# Patient Record
Sex: Male | Born: 1945 | Race: Black or African American | Hispanic: No | Smoking: Never smoker
Health system: Southern US, Community
[De-identification: ages and names within clinical notes are randomized; demographics above are authoritative.]

## PROBLEM LIST (undated history)

## (undated) DIAGNOSIS — G40909 Epilepsy, unspecified, not intractable, without status epilepticus: Secondary | ICD-10-CM

## (undated) DIAGNOSIS — I639 Cerebral infarction, unspecified: Secondary | ICD-10-CM

## (undated) DIAGNOSIS — R351 Nocturia: Secondary | ICD-10-CM

## (undated) DIAGNOSIS — Z87438 Personal history of other diseases of male genital organs: Secondary | ICD-10-CM

## (undated) DIAGNOSIS — F1111 Opioid abuse, in remission: Secondary | ICD-10-CM

## (undated) DIAGNOSIS — E119 Type 2 diabetes mellitus without complications: Secondary | ICD-10-CM

## (undated) DIAGNOSIS — R32 Unspecified urinary incontinence: Secondary | ICD-10-CM

## (undated) DIAGNOSIS — M1990 Unspecified osteoarthritis, unspecified site: Secondary | ICD-10-CM

## (undated) DIAGNOSIS — M545 Low back pain, unspecified: Secondary | ICD-10-CM

## (undated) DIAGNOSIS — N471 Phimosis: Secondary | ICD-10-CM

## (undated) DIAGNOSIS — N182 Chronic kidney disease, stage 2 (mild): Secondary | ICD-10-CM

## (undated) DIAGNOSIS — I10 Essential (primary) hypertension: Secondary | ICD-10-CM

## (undated) DIAGNOSIS — E785 Hyperlipidemia, unspecified: Secondary | ICD-10-CM

## (undated) DIAGNOSIS — F319 Bipolar disorder, unspecified: Secondary | ICD-10-CM

## (undated) DIAGNOSIS — Z972 Presence of dental prosthetic device (complete) (partial): Secondary | ICD-10-CM

## (undated) DIAGNOSIS — G8929 Other chronic pain: Secondary | ICD-10-CM

## (undated) DIAGNOSIS — K759 Inflammatory liver disease, unspecified: Secondary | ICD-10-CM

## (undated) DIAGNOSIS — Z973 Presence of spectacles and contact lenses: Secondary | ICD-10-CM

## (undated) DIAGNOSIS — R569 Unspecified convulsions: Secondary | ICD-10-CM

## (undated) DIAGNOSIS — Z8619 Personal history of other infectious and parasitic diseases: Secondary | ICD-10-CM

## (undated) DIAGNOSIS — Z8673 Personal history of transient ischemic attack (TIA), and cerebral infarction without residual deficits: Secondary | ICD-10-CM

## (undated) DIAGNOSIS — F419 Anxiety disorder, unspecified: Secondary | ICD-10-CM

## (undated) HISTORY — PX: BACK SURGERY: SHX140

## (undated) HISTORY — DX: Hyperlipidemia, unspecified: E78.5

## (undated) HISTORY — PX: OTHER SURGICAL HISTORY: SHX169

---

## 1988-07-28 HISTORY — PX: OTHER SURGICAL HISTORY: SHX169

## 2010-02-13 ENCOUNTER — Observation Stay (HOSPITAL_COMMUNITY)
Admission: EM | Admit: 2010-02-13 | Discharge: 2010-02-14 | Payer: Self-pay | Source: Home / Self Care | Admitting: Emergency Medicine

## 2010-02-17 ENCOUNTER — Emergency Department (HOSPITAL_COMMUNITY): Admission: EM | Admit: 2010-02-17 | Discharge: 2010-02-18 | Payer: Self-pay | Admitting: Emergency Medicine

## 2010-06-03 ENCOUNTER — Emergency Department (HOSPITAL_COMMUNITY): Admission: EM | Admit: 2010-06-03 | Discharge: 2010-06-03 | Payer: Self-pay | Admitting: Emergency Medicine

## 2010-08-01 ENCOUNTER — Emergency Department (HOSPITAL_COMMUNITY)
Admission: EM | Admit: 2010-08-01 | Discharge: 2010-08-01 | Payer: Self-pay | Source: Home / Self Care | Admitting: Emergency Medicine

## 2010-10-08 LAB — CBC
Hemoglobin: 11.7 g/dL — ABNORMAL LOW (ref 13.0–17.0)
MCH: 30.8 pg (ref 26.0–34.0)
RBC: 3.8 MIL/uL — ABNORMAL LOW (ref 4.22–5.81)

## 2010-10-08 LAB — COMPREHENSIVE METABOLIC PANEL
ALT: 19 U/L (ref 0–53)
AST: 31 U/L (ref 0–37)
Alkaline Phosphatase: 78 U/L (ref 39–117)
CO2: 29 mEq/L (ref 19–32)
Chloride: 106 mEq/L (ref 96–112)
Creatinine, Ser: 1.03 mg/dL (ref 0.4–1.5)
GFR calc Af Amer: >60 mL/min (ref >60)
GFR calc non Af Amer: >60 mL/min (ref >60)
Potassium: 4.3 mEq/L (ref 3.5–5.1)
Total Bilirubin: 0.6 mg/dL (ref 0.3–1.2)

## 2010-10-08 LAB — DIFFERENTIAL
Basophils Absolute: 0 10*3/uL (ref 0.0–0.1)
Basophils Relative: 0 % (ref 0–1)
Eosinophils Absolute: 0.1 10*3/uL (ref 0.0–0.7)
Eosinophils Relative: 1 % (ref 0–5)
Lymphocytes Relative: 27 % (ref 12–46)

## 2010-10-08 LAB — RAPID URINE DRUG SCREEN, HOSP PERFORMED: Barbiturates: NOT DETECTED

## 2010-10-12 LAB — COMPREHENSIVE METABOLIC PANEL
ALT: 10 U/L (ref 0–53)
AST: 22 U/L (ref 0–37)
Albumin: 3.5 g/dL (ref 3.5–5.2)
Alkaline Phosphatase: 86 U/L (ref 39–117)
CO2: 22 mEq/L (ref 19–32)
Chloride: 107 mEq/L (ref 96–112)
Creatinine, Ser: 0.96 mg/dL (ref 0.4–1.5)
GFR calc Af Amer: >60 mL/min (ref >60)
GFR calc non Af Amer: >60 mL/min (ref >60)
Potassium: 3.6 mEq/L (ref 3.5–5.1)
Total Bilirubin: 0.9 mg/dL (ref 0.3–1.2)

## 2010-10-12 LAB — CBC
HCT: 41 % (ref 39.0–52.0)
Hemoglobin: 13.4 g/dL (ref 13.0–17.0)
Hemoglobin: 13.4 g/dL (ref 13.0–17.0)
MCH: 31.5 pg (ref 26.0–34.0)
MCH: 33.3 pg (ref 26.0–34.0)
MCHC: 32.8 g/dL (ref 30.0–36.0)
Platelets: 132 10*3/uL — ABNORMAL LOW (ref 150–400)
RBC: 4.04 MIL/uL — ABNORMAL LOW (ref 4.22–5.81)
RBC: 4.26 MIL/uL (ref 4.22–5.81)
WBC: 7.5 10*3/uL (ref 4.0–10.5)

## 2010-10-12 LAB — DIFFERENTIAL
Basophils Absolute: 0 10*3/uL (ref 0.0–0.1)
Basophils Relative: 0 % (ref 0–1)
Basophils Relative: 1 % (ref 0–1)
Eosinophils Absolute: 0.1 10*3/uL (ref 0.0–0.7)
Eosinophils Absolute: 0.1 10*3/uL (ref 0.0–0.7)
Eosinophils Relative: 1 % (ref 0–5)
Lymphocytes Relative: 21 % (ref 12–46)
Lymphs Abs: 1 10*3/uL (ref 0.7–4.0)
Monocytes Absolute: 0.6 10*3/uL (ref 0.1–1.0)
Monocytes Absolute: 0.7 10*3/uL (ref 0.1–1.0)
Monocytes Relative: 8 % (ref 3–12)
Neutro Abs: 5.3 10*3/uL (ref 1.7–7.7)

## 2010-10-12 LAB — GLUCOSE, CAPILLARY: Glucose-Capillary: 186 mg/dL — ABNORMAL HIGH (ref 70–99)

## 2010-10-12 LAB — BASIC METABOLIC PANEL
CO2: 26 mEq/L (ref 19–32)
Chloride: 107 mEq/L (ref 96–112)
Glucose, Bld: 113 mg/dL — ABNORMAL HIGH (ref 70–99)
Potassium: 4.2 mEq/L (ref 3.5–5.1)
Sodium: 139 mEq/L (ref 135–145)

## 2010-10-12 LAB — POCT CARDIAC MARKERS: CKMB, poc: <1 ng/mL — ABNORMAL LOW (ref 1.0–8.0)

## 2011-07-29 HISTORY — PX: COLONOSCOPY: SHX174

## 2011-11-24 ENCOUNTER — Emergency Department (HOSPITAL_COMMUNITY): Payer: Medicare Other

## 2011-11-24 ENCOUNTER — Encounter (HOSPITAL_COMMUNITY): Payer: Self-pay | Admitting: Emergency Medicine

## 2011-11-24 ENCOUNTER — Emergency Department (HOSPITAL_COMMUNITY)
Admission: EM | Admit: 2011-11-24 | Discharge: 2011-11-24 | Disposition: A | Discharge status: Home / Self Care | Payer: Medicare Other | Attending: Emergency Medicine | Admitting: Emergency Medicine

## 2011-11-24 DIAGNOSIS — R0789 Other chest pain: Secondary | ICD-10-CM | POA: Insufficient documentation

## 2011-11-24 DIAGNOSIS — E119 Type 2 diabetes mellitus without complications: Secondary | ICD-10-CM | POA: Insufficient documentation

## 2011-11-24 DIAGNOSIS — K529 Noninfective gastroenteritis and colitis, unspecified: Secondary | ICD-10-CM

## 2011-11-24 DIAGNOSIS — R197 Diarrhea, unspecified: Secondary | ICD-10-CM | POA: Insufficient documentation

## 2011-11-24 DIAGNOSIS — I10 Essential (primary) hypertension: Secondary | ICD-10-CM | POA: Insufficient documentation

## 2011-11-24 DIAGNOSIS — R1013 Epigastric pain: Secondary | ICD-10-CM | POA: Insufficient documentation

## 2011-11-24 DIAGNOSIS — Z79899 Other long term (current) drug therapy: Secondary | ICD-10-CM | POA: Insufficient documentation

## 2011-11-24 DIAGNOSIS — Z8619 Personal history of other infectious and parasitic diseases: Secondary | ICD-10-CM | POA: Insufficient documentation

## 2011-11-24 DIAGNOSIS — R112 Nausea with vomiting, unspecified: Secondary | ICD-10-CM | POA: Insufficient documentation

## 2011-11-24 DIAGNOSIS — R0602 Shortness of breath: Secondary | ICD-10-CM | POA: Insufficient documentation

## 2011-11-24 DIAGNOSIS — B1920 Unspecified viral hepatitis C without hepatic coma: Secondary | ICD-10-CM | POA: Insufficient documentation

## 2011-11-24 DIAGNOSIS — K5289 Other specified noninfective gastroenteritis and colitis: Secondary | ICD-10-CM | POA: Insufficient documentation

## 2011-11-24 HISTORY — DX: Essential (primary) hypertension: I10

## 2011-11-24 LAB — URINE MICROSCOPIC-ADD ON

## 2011-11-24 LAB — CBC
Hemoglobin: 13.5 g/dL (ref 13.0–17.0)
MCH: 31.6 pg (ref 26.0–34.0)
MCHC: 34 g/dL (ref 30.0–36.0)
Platelets: 118 10*3/uL — ABNORMAL LOW (ref 150–400)
RDW: 14.4 % (ref 11.5–15.5)

## 2011-11-24 LAB — LIPASE, BLOOD: Lipase: 23 U/L (ref 11–59)

## 2011-11-24 LAB — URINALYSIS, ROUTINE W REFLEX MICROSCOPIC
Glucose, UA: NEGATIVE mg/dL
Ketones, ur: 15 mg/dL — AB
Nitrite: NEGATIVE
Specific Gravity, Urine: 1.037 — ABNORMAL HIGH (ref 1.005–1.030)
pH: 5.5 (ref 5.0–8.0)

## 2011-11-24 LAB — DIFFERENTIAL
Basophils Absolute: 0 10*3/uL (ref 0.0–0.1)
Basophils Relative: 0 % (ref 0–1)
Eosinophils Absolute: 0 10*3/uL (ref 0.0–0.7)
Monocytes Relative: 7 % (ref 3–12)
Neutro Abs: 10 10*3/uL — ABNORMAL HIGH (ref 1.7–7.7)
Neutrophils Relative %: 82 % — ABNORMAL HIGH (ref 43–77)

## 2011-11-24 LAB — COMPREHENSIVE METABOLIC PANEL
AST: 19 U/L (ref 0–37)
Albumin: 3.8 g/dL (ref 3.5–5.2)
Alkaline Phosphatase: 113 U/L (ref 39–117)
BUN: 24 mg/dL — ABNORMAL HIGH (ref 6–23)
Chloride: 104 mEq/L (ref 96–112)
Potassium: 3.5 mEq/L (ref 3.5–5.1)
Total Protein: 7.7 g/dL (ref 6.0–8.3)

## 2011-11-24 LAB — POCT I-STAT TROPONIN I

## 2011-11-24 MED ORDER — HYDROMORPHONE HCL PF 2 MG/ML IJ SOLN
1.0000 mg | Freq: Once | INTRAMUSCULAR | Status: AC
  Administered 2011-11-24: 1 mg via INTRAVENOUS
  Filled 2011-11-24: qty 1

## 2011-11-24 MED ORDER — MORPHINE SULFATE 4 MG/ML IJ SOLN
4.0000 mg | Freq: Once | INTRAMUSCULAR | Status: AC
  Administered 2011-11-24: 4 mg via INTRAVENOUS
  Filled 2011-11-24: qty 1

## 2011-11-24 MED ORDER — ONDANSETRON HCL 4 MG/2ML IJ SOLN
INTRAMUSCULAR | Status: AC
  Filled 2011-11-24: qty 2

## 2011-11-24 MED ORDER — METOCLOPRAMIDE HCL 10 MG PO TABS: 10.0000 mg | ORAL_TABLET | Freq: Four times a day (QID) | ORAL | Status: DC

## 2011-11-24 MED ORDER — DICYCLOMINE HCL 20 MG PO TABS: 20.0000 mg | ORAL_TABLET | Freq: Two times a day (BID) | ORAL | Status: DC

## 2011-11-24 MED ORDER — METOCLOPRAMIDE HCL 5 MG/ML IJ SOLN
10.0000 mg | Freq: Once | INTRAMUSCULAR | Status: AC
  Administered 2011-11-24: 10 mg via INTRAVENOUS
  Filled 2011-11-24: qty 2

## 2011-11-24 NOTE — ED Provider Notes (Signed)
History     CSN: [id number]  Arrival date & time 11/24/11  1813   First MD Initiated Contact with Patient 11/24/11 1852      6:56 PM HPI Patient reports suddenly developing diarrhea. Reports symptoms were associated with nausea. Data since he finished his bowel movements he had epigastric pain that radiated to his left chest. Patient is complaining of significant amount nausea. Denies been sick contacts or food contacts. Denies history of MI. Does have a history of diabetes and hypertension. Patient is a 66 y.o. male presenting with chest pain. The history is provided by the patient.  Chest Pain The chest pain began 3 - 5 hours ago. Chest pain occurs constantly. The chest pain is worsening. The severity of the pain is moderate. The quality of the pain is described as tightness. The pain radiates to the epigastrium. Primary symptoms include shortness of breath, abdominal pain, nausea and vomiting. Pertinent negatives for primary symptoms include no fever, no fatigue and no dizziness.  Pertinent negatives for associated symptoms include no claudication, no diaphoresis, no near-syncope, no numbness, no orthopnea and no weakness. He tried aspirin and nitroglycerin for the symptoms. Risk factors include male gender.  His past medical history is significant for diabetes and hypertension.  Pertinent negatives for past medical history include no cancer, no CHF, no DVT, no hyperlipidemia, no MI, no PE and no TIA.  Pertinent negatives for family medical history include: no early MI in family.     Past Medical History  Diagnosis Date  . Hepatitis C   . Diabetes mellitus   . Hypertension     History reviewed. No pertinent past surgical history.  History reviewed. No pertinent family history.  History  Substance Use Topics  . Smoking status: Not on file  . Smokeless tobacco: Not on file  . Alcohol Use:       Review of Systems  Constitutional: Negative for fever, chills, diaphoresis and  fatigue.  HENT: Negative for sore throat.   Respiratory: Positive for shortness of breath.   Cardiovascular: Positive for chest pain. Negative for orthopnea, claudication and near-syncope.  Gastrointestinal: Positive for nausea, vomiting, abdominal pain and diarrhea. Negative for constipation, blood in stool and rectal pain.  Genitourinary: Negative for dysuria, hematuria, flank pain, discharge, penile pain and testicular pain.  Musculoskeletal: Negative for back pain.  Neurological: Negative for dizziness, weakness, numbness and headaches.  All other systems reviewed and are negative.    Allergies  Review of patient's allergies indicates no known allergies.  Home Medications   Current Outpatient Rx  Name Route Sig Dispense Refill  . METHADONE HCL 10 MG PO TABS Oral Take 10 mg by mouth at bedtime.    Marland Kitchen RISPERIDONE 0.5 MG PO TABS Oral Take 0.5 mg by mouth 2 (two) times daily.      BP 134/68  Pulse 62  Temp(Src) 98.8 F (37.1 C) (Oral)  Resp 10  SpO2 94%  Physical Exam  Constitutional: He is oriented to person, place, and time. He appears well-developed and well-nourished.  HENT:  Head: Normocephalic and atraumatic.  Eyes: Conjunctivae are normal. Pupils are equal, round, and reactive to light.  Neck: Normal range of motion. Neck supple.  Cardiovascular: Normal rate, regular rhythm and normal heart sounds.  Exam reveals no gallop and no friction rub.   No murmur heard. Pulmonary/Chest: Effort normal and breath sounds normal. He has no wheezes. He has no rales. He exhibits no tenderness.  Abdominal: Soft. Bowel sounds are normal. He  exhibits no distension and no mass. There is no tenderness. There is no rebound and no guarding.       Patient appears his about to vomit.  Neurological: He is alert and oriented to person, place, and time.  Skin: Skin is warm and dry. No rash noted. No erythema. No pallor.  Psychiatric: He has a normal mood and affect. His behavior is normal.     ED Course  Procedures   Date: 11/24/2011  Rate: 56  Rhythm: normal sinus rhythm  QRS Axis: normal  Intervals: normal  ST/T Wave abnormalities: normal  Conduction Disutrbances: none   Results for orders placed during the hospital encounter of 11/24/11  CBC      Component Value Range   WBC 12.2 (*) 4.0 - 10.5 (K/uL)   RBC 4.27  4.22 - 5.81 (MIL/uL)   Hemoglobin 13.5  13.0 - 17.0 (g/dL)   HCT 16.1  09.6 - 04.5 (%)   MCV 93.0  78.0 - 100.0 (fL)   MCH 31.6  26.0 - 34.0 (pg)   MCHC 34.0  30.0 - 36.0 (g/dL)   RDW 40.9  81.1 - 91.4 (%)   Platelets 118 (*) 150 - 400 (K/uL)  DIFFERENTIAL      Component Value Range   Neutrophils Relative 82 (*) 43 - 77 (%)   Neutro Abs 10.0 (*) 1.7 - 7.7 (K/uL)   Lymphocytes Relative 11 (*) 12 - 46 (%)   Lymphs Abs 1.3  0.7 - 4.0 (K/uL)   Monocytes Relative 7  3 - 12 (%)   Monocytes Absolute 0.8  0.1 - 1.0 (K/uL)   Eosinophils Relative 0  0 - 5 (%)   Eosinophils Absolute 0.0  0.0 - 0.7 (K/uL)   Basophils Relative 0  0 - 1 (%)   Basophils Absolute 0.0  0.0 - 0.1 (K/uL)  COMPREHENSIVE METABOLIC PANEL      Component Value Range   Sodium 139  135 - 145 (mEq/L)   Potassium 3.5  3.5 - 5.1 (mEq/L)   Chloride 104  96 - 112 (mEq/L)   CO2 25  19 - 32 (mEq/L)   Glucose, Bld 115 (*) 70 - 99 (mg/dL)   BUN 24 (*) 6 - 23 (mg/dL)   Creatinine, Ser 7.82  0.50 - 1.35 (mg/dL)   Calcium 9.2  8.4 - 95.6 (mg/dL)   Total Protein 7.7  6.0 - 8.3 (g/dL)   Albumin 3.8  3.5 - 5.2 (g/dL)   AST 19  0 - 37 (U/L)   ALT 8  0 - 53 (U/L)   Alkaline Phosphatase 113  39 - 117 (U/L)   Total Bilirubin 0.2 (*) 0.3 - 1.2 (mg/dL)   GFR calc non Af Amer 77 (*) >90 (mL/min)   GFR calc Af Amer 89 (*) >90 (mL/min)  LIPASE, BLOOD      Component Value Range   Lipase 23  11 - 59 (U/L)  URINALYSIS, ROUTINE W REFLEX MICROSCOPIC      Component Value Range   Color, Urine AMBER (*) YELLOW    APPearance CLOUDY (*) CLEAR    Specific Gravity, Urine 1.037 (*) 1.005 - 1.030    pH 5.5   5.0 - 8.0    Glucose, UA NEGATIVE  NEGATIVE (mg/dL)   Hgb urine dipstick SMALL (*) NEGATIVE    Bilirubin Urine SMALL (*) NEGATIVE    Ketones, ur 15 (*) NEGATIVE (mg/dL)   Protein, ur 30 (*) NEGATIVE (mg/dL)   Urobilinogen, UA 1.0  0.0 -  1.0 (mg/dL)   Nitrite NEGATIVE  NEGATIVE    Leukocytes, UA SMALL (*) NEGATIVE   POCT I-STAT TROPONIN I      Component Value Range   Troponin i, poc 0.00  0.00 - 0.08 (ng/mL)   Comment 3           URINE MICROSCOPIC-ADD ON      Component Value Range   Squamous Epithelial / LPF FEW (*) RARE    WBC, UA 11-20  <3 (WBC/hpf)   RBC / HPF 7-10  <3 (RBC/hpf)   Bacteria, UA RARE  RARE    Casts HYALINE CASTS (*) NEGATIVE    Urine-Other MUCOUS PRESENT     Dg Chest 2 View  11/24/2011  *RADIOLOGY REPORT*  Clinical Data: Chest pain, hypertension, diabetes  CHEST - 2 VIEW  Comparison: 02/13/2010  Findings: Minimal increase in size of cardiac silhouette and mediastinal contours, possibly accentuated due to slightly decreased lung volumes.  No focal airspace opacities.  No definite pleural effusion or pneumothorax.  Unchanged bones.  IMPRESSION: 1.  No acute cardiopulmonary disease. 2.  Apparent minimal increase in size of cardiac silhouette and mediastinal contours is possibly accentuated due to slightly decreased lung volumes. Further evaluation with cardiac echo may be performed as clinically indicated.  Original Report Authenticated By: Waynard Reeds, M.D.    MDM   10:36 PM Discussed labs and exam are normal. Will give meds for nausea and pain. Discussed this with patient. Pt voices understanding. Patient exam continues to be benign of pain         Thomasene Lot, PA-C 11/24/11 2238

## 2011-11-24 NOTE — Discharge Instructions (Signed)
Viral Gastroenteritis Viral gastroenteritis is also known as stomach flu. This condition affects the stomach and intestinal tract. It can cause sudden diarrhea and vomiting. The illness typically lasts 3 to 8 days. Most people develop an immune response that eventually gets rid of the virus. While this natural response develops, the virus can make you quite ill. CAUSES  Many different viruses can cause gastroenteritis, such as rotavirus or noroviruses. You can catch one of these viruses by consuming contaminated food or water. You may also catch a virus by sharing utensils or other personal items with an infected person or by touching a contaminated surface. SYMPTOMS  The most common symptoms are diarrhea and vomiting. These problems can cause a severe loss of body fluids (dehydration) and a body salt (electrolyte) imbalance. Other symptoms may include:  Fever.   Headache.   Fatigue.   Abdominal pain.  DIAGNOSIS  Your caregiver can usually diagnose viral gastroenteritis based on your symptoms and a physical exam. A stool sample may also be taken to test for the presence of viruses or other infections. TREATMENT  This illness typically goes away on its own. Treatments are aimed at rehydration. The most serious cases of viral gastroenteritis involve vomiting so severely that you are not able to keep fluids down. In these cases, fluids must be given through an intravenous line (IV). HOME CARE INSTRUCTIONS   Drink enough fluids to keep your urine clear or pale yellow. Drink small amounts of fluids frequently and increase the amounts as tolerated.   Ask your caregiver for specific rehydration instructions.   Avoid:   Foods high in sugar.   Alcohol.   Carbonated drinks.   Tobacco.   Juice.   Caffeine drinks.   Extremely hot or cold fluids.   Fatty, greasy foods.   Too much intake of anything at one time.   Dairy products until 24 to 48 hours after diarrhea stops.   You may  consume probiotics. Probiotics are active cultures of beneficial bacteria. They may lessen the amount and number of diarrheal stools in adults. Probiotics can be found in yogurt with active cultures and in supplements.   Wash your hands well to avoid spreading the virus.   Only take over-the-counter or prescription medicines for pain, discomfort, or fever as directed by your caregiver. Do not give aspirin to children. Antidiarrheal medicines are not recommended.   Ask your caregiver if you should continue to take your regular prescribed and over-the-counter medicines.   Keep all follow-up appointments as directed by your caregiver.  SEEK IMMEDIATE MEDICAL CARE IF:   You are unable to keep fluids down.   You do not urinate at least once every 6 to 8 hours.   You develop shortness of breath.   You notice blood in your stool or vomit. This may look like coffee grounds.   You have abdominal pain that increases or is concentrated in one small area (localized).   You have persistent vomiting or diarrhea.   You have a fever.   The patient is a child younger than 3 months, and he or she has a fever.   The patient is a child older than 3 months, and he or she has a fever and persistent symptoms.   The patient is a child older than 3 months, and he or she has a fever and symptoms suddenly get worse.   The patient is a baby, and he or she has no tears when crying.  MAKE SURE YOU:     Understand these instructions.   Will watch your condition.   Will get help right away if you are not doing well or get worse.  Document Released: 07/14/2005 Document Revised: 07/03/2011 Document Reviewed: 04/30/2011 [facility] Patient Information 2012 [place], [place].  B.R.A.T. Diet Your doctor has recommended the B.R.A.T. diet for you or your child until the condition improves. This is often used to help control diarrhea and vomiting symptoms. If you or your child can tolerate clear liquids, you may  have:  Bananas.   Rice.   Applesauce.   Toast (and other simple starches such as crackers, potatoes, noodles).  Be sure to avoid dairy products, meats, and fatty foods until symptoms are better. Fruit juices such as apple, grape, and prune juice can make diarrhea worse. Avoid these. Continue this diet for 2 days or as instructed by your caregiver. Document Released: 07/14/2005 Document Revised: 07/03/2011 Document Reviewed: 12/31/2006 [facility] Patient Information 2012 [place], [place].

## 2011-11-24 NOTE — ED Notes (Signed)
Patient returned from xray and is complaining of nausea. Radiology tech reported that patient vomited 3 times while in xray.

## 2011-11-24 NOTE — ED Provider Notes (Signed)
Medical screening examination/treatment/procedure(s) were performed by non-physician practitioner and as supervising physician I was immediately available for consultation/collaboration.  Ethelda Chick, MD 11/24/11 2308

## 2011-11-24 NOTE — ED Notes (Signed)
Patient is unable to urinate at this time 

## 2011-11-24 NOTE — ED Notes (Signed)
Per EMS: pt from home c/o left sided CP to left sided lower abd pain starting while straining to have BM: pt given 4mg  Zofran; pt c/o nausea and vomiting; pt given 324mg  ASA and 2 SL nitro; per EMS pt sts CP then moved to lower abd

## 2011-11-24 NOTE — ED Notes (Signed)
Patient transported to X-ray 

## 2012-09-14 ENCOUNTER — Ambulatory Visit (HOSPITAL_COMMUNITY): Payer: Medicare Other | Admitting: Licensed Clinical Social Worker

## 2012-09-21 ENCOUNTER — Ambulatory Visit (HOSPITAL_COMMUNITY): Payer: Medicare Other | Admitting: Licensed Clinical Social Worker

## 2013-04-27 ENCOUNTER — Other Ambulatory Visit (HOSPITAL_COMMUNITY): Payer: Self-pay | Admitting: *Deleted

## 2013-04-27 DIAGNOSIS — B182 Chronic viral hepatitis C: Secondary | ICD-10-CM

## 2013-05-05 ENCOUNTER — Emergency Department (HOSPITAL_COMMUNITY): Payer: Medicare Other

## 2013-05-05 ENCOUNTER — Other Ambulatory Visit: Payer: Self-pay

## 2013-05-05 ENCOUNTER — Emergency Department (HOSPITAL_COMMUNITY)
Admission: EM | Admit: 2013-05-05 | Discharge: 2013-05-05 | Disposition: A | Discharge status: Home / Self Care | Payer: Medicare Other | Attending: Emergency Medicine | Admitting: Emergency Medicine

## 2013-05-05 ENCOUNTER — Encounter (HOSPITAL_COMMUNITY): Payer: Self-pay | Admitting: Emergency Medicine

## 2013-05-05 DIAGNOSIS — R609 Edema, unspecified: Secondary | ICD-10-CM | POA: Diagnosis present

## 2013-05-05 DIAGNOSIS — M7989 Other specified soft tissue disorders: Secondary | ICD-10-CM

## 2013-05-05 DIAGNOSIS — Z8619 Personal history of other infectious and parasitic diseases: Secondary | ICD-10-CM | POA: Insufficient documentation

## 2013-05-05 DIAGNOSIS — I10 Essential (primary) hypertension: Secondary | ICD-10-CM | POA: Insufficient documentation

## 2013-05-05 DIAGNOSIS — Z79899 Other long term (current) drug therapy: Secondary | ICD-10-CM | POA: Insufficient documentation

## 2013-05-05 DIAGNOSIS — E119 Type 2 diabetes mellitus without complications: Secondary | ICD-10-CM | POA: Insufficient documentation

## 2013-05-05 DIAGNOSIS — I517 Cardiomegaly: Secondary | ICD-10-CM | POA: Diagnosis present

## 2013-05-05 LAB — COMPREHENSIVE METABOLIC PANEL
AST: 18 U/L (ref 0–37)
BUN: 16 mg/dL (ref 6–23)
CO2: 25 mEq/L (ref 19–32)
Calcium: 8.5 mg/dL (ref 8.4–10.5)
Chloride: 108 mEq/L (ref 96–112)
Creatinine, Ser: 1.11 mg/dL (ref 0.50–1.35)
GFR calc Af Amer: 79 mL/min — ABNORMAL LOW (ref >90)
GFR calc non Af Amer: 68 mL/min — ABNORMAL LOW (ref >90)
Glucose, Bld: 93 mg/dL (ref 70–99)
Total Bilirubin: 0.3 mg/dL (ref 0.3–1.2)

## 2013-05-05 LAB — POCT I-STAT TROPONIN I: Troponin i, poc: 0 ng/mL (ref 0.00–0.08)

## 2013-05-05 LAB — CBC
Hemoglobin: 13.5 g/dL (ref 13.0–17.0)
MCH: 31.8 pg (ref 26.0–34.0)
MCHC: 34.7 g/dL (ref 30.0–36.0)
MCV: 91.5 fL (ref 78.0–100.0)
RBC: 4.25 MIL/uL (ref 4.22–5.81)

## 2013-05-05 LAB — URINALYSIS, ROUTINE W REFLEX MICROSCOPIC
Ketones, ur: NEGATIVE mg/dL
Nitrite: NEGATIVE
Protein, ur: NEGATIVE mg/dL
pH: 5.5 (ref 5.0–8.0)

## 2013-05-05 LAB — URINE MICROSCOPIC-ADD ON

## 2013-05-05 MED ORDER — HYDROCHLOROTHIAZIDE 12.5 MG PO CAPS
12.5000 mg | ORAL_CAPSULE | Freq: Every day | ORAL | Status: DC
  Administered 2013-05-05: 12.5 mg via ORAL
  Filled 2013-05-05: qty 1

## 2013-05-05 MED ORDER — HYDROCHLOROTHIAZIDE 25 MG PO TABS: 25.0000 mg | ORAL_TABLET | Freq: Every day | ORAL | Status: DC

## 2013-05-05 NOTE — ED Provider Notes (Signed)
CSN: [id number]     Arrival date & time 05/05/13  1336 History   First MD Initiated Contact with Patient 05/05/13 1540     Chief Complaint  Patient presents with  . Shortness of Breath  . Edema   (Consider location/radiation/quality/duration/timing/severity/associated sxs/prior Treatment) Patient is a 67 y.o. male presenting with shortness of breath. The history is provided by the patient.  Shortness of Breath Severity:  Mild Onset quality:  Gradual Duration:  1 month Timing:  Constant Progression:  Unchanged Chronicity:  New Context: activity   Relieved by:  Rest Worsened by:  Nothing tried Ineffective treatments:  None tried Associated symptoms: no abdominal pain, no chest pain, no cough, no fever, no headaches, no neck pain and no vomiting   Risk factors: obesity   Risk factors: no hx of cancer, no hx of PE/DVT, no prolonged immobilization, no recent surgery and no tobacco use     Past Medical History  Diagnosis Date  . Hepatitis C   . Diabetes mellitus   . Hypertension    History reviewed. No pertinent past surgical history. No family history on file. History  Substance Use Topics  . Smoking status: Never Smoker   . Smokeless tobacco: Not on file  . Alcohol Use: No    Review of Systems  Constitutional: Negative for fever.  HENT: Negative for drooling and rhinorrhea.   Eyes: Negative for pain.  Respiratory: Positive for shortness of breath. Negative for cough.   Cardiovascular: Negative for chest pain and leg swelling.  Gastrointestinal: Negative for nausea, vomiting, abdominal pain and diarrhea.  Genitourinary: Negative for dysuria and hematuria.  Musculoskeletal: Negative for gait problem and neck pain.  Skin: Negative for color change.  Neurological: Negative for numbness and headaches.  Hematological: Negative for adenopathy.  Psychiatric/Behavioral: Negative for behavioral problems.  All other systems reviewed and are negative.    Allergies  Review  of patient's allergies indicates no known allergies.  Home Medications   Current Outpatient Rx  Name  Route  Sig  Dispense  Refill  . clonazePAM (KLONOPIN) 0.5 MG tablet   Oral   Take 0.5 mg by mouth 3 (three) times daily as needed for anxiety.         . gabapentin (NEURONTIN) 600 MG tablet   Oral   Take 600 mg by mouth 3 (three) times daily.         Marland Kitchen levETIRAcetam (KEPPRA) 500 MG tablet   Oral   Take 500 mg by mouth 2 (two) times daily.         . risperiDONE (RISPERDAL) 0.5 MG tablet   Oral   Take 0.5 mg by mouth 2 (two) times daily.         Marland Kitchen EXPIRED: dicyclomine (BENTYL) 20 MG tablet   Oral   Take 1 tablet (20 mg total) by mouth 2 (two) times daily.   20 tablet   0   . EXPIRED: metoCLOPramide (REGLAN) 10 MG tablet   Oral   Take 1 tablet (10 mg total) by mouth every 6 (six) hours.   30 tablet   0    BP 113/74  Pulse 88  Temp(Src) 99.3 F (37.4 C) (Oral)  Resp 24  Ht 5\' 9"  (1.753 m)  Wt 288 lb (130.636 kg)  BMI 42.51 kg/m2  SpO2 97% Physical Exam  Nursing note and vitals reviewed. Constitutional: He is oriented to person, place, and time. He appears well-developed and well-nourished.  HENT:  Head: Normocephalic and atraumatic.  Right  Ear: External ear normal.  Left Ear: External ear normal.  Nose: Nose normal.  Mouth/Throat: Oropharynx is clear and moist. No oropharyngeal exudate.  Eyes: Conjunctivae and EOM are normal. Pupils are equal, round, and reactive to light.  Neck: Normal range of motion. Neck supple.  Cardiovascular: Normal rate, regular rhythm, normal heart sounds and intact distal pulses.  Exam reveals no gallop and no friction rub.   No murmur heard. Pulmonary/Chest: Effort normal and breath sounds normal. No respiratory distress. He has no wheezes.  Abdominal: Soft. Bowel sounds are normal. He exhibits no distension. There is no tenderness. There is no rebound and no guarding.  Musculoskeletal: Normal range of motion. He exhibits  edema (mild pitting edema in bilateral LE's, right greater than left. ). He exhibits no tenderness.  Neurological: He is alert and oriented to person, place, and time.  Skin: Skin is warm and dry.  Psychiatric: He has a normal mood and affect. His behavior is normal.    ED Course  Procedures (including critical care time) Labs Review Labs Reviewed  CBC - Abnormal; Notable for the following:    HCT 38.9 (*)    All other components within normal limits  PRO B NATRIURETIC PEPTIDE - Abnormal; Notable for the following:    Pro B Natriuretic peptide (BNP) 240.7 (*)    All other components within normal limits  COMPREHENSIVE METABOLIC PANEL - Abnormal; Notable for the following:    Albumin 3.2 (*)    Alkaline Phosphatase 119 (*)    GFR calc non Af Amer 68 (*)    GFR calc Af Amer 79 (*)    All other components within normal limits  URINALYSIS, ROUTINE W REFLEX MICROSCOPIC  POCT I-STAT TROPONIN I   Imaging Review Dg Chest 2 View  05/05/2013   *RADIOLOGY REPORT*  Clinical Data: Shortness of breath, pulmonary edema, initial encounter.  CHEST - 2 VIEW  Comparison: 11/24/2011; 02/13/2010  Findings:  Grossly unchanged enlarged cardiac silhouette and mediastinal contours. Grossly unchanged minimal perihilar heterogeneous opacities favored to represent atelectasis.  No new focal airspace opacity.  No pleural effusion or pneumothorax.  No definite evidence of edema.  Grossly unchanged bones.  IMPRESSION: Cardiomegaly without acute cardiopulmonary disease.  Specifically, no definite evidence of edema.   Original Report Authenticated By: Tacey Ruiz, MD    EKG Interpretation   None       Date: 05/05/2013  Rate: 96  Rhythm: normal sinus rhythm  QRS Axis: normal  Intervals: normal  ST/T Wave abnormalities: nonspecific T wave changes  Conduction Disutrbances:none  Narrative Interpretation: flattening/inversion of t waves in lead III unchanged from previous ecg  Old EKG Reviewed:  unchanged   MDM   1. Edema   2. Cardiomegaly    3:54 PM 67 y.o. male with a history of diabetes and hepatitis C who presents with gradual onset shortness of breath and edema for the last month. The patient states that he recently moved here from [place] and has not established with a primary care doctor. He notes asymmetric swelling in his lower extremities with the right leg pain more swollen than the left. He feels inc swelling in both LE's and his abdomen. He denies any chest pain and notes only mild shortness of breath with exertion. He notes mild pain in his lower extremities at night. He is afebrile and vital signs are unremarkable here. He otherwise appears well on exam. Will get ultrasound to rule out DVT. I have a low suspicion for  PE given lack of chest pain, gradual onset shortness of breath, patient not tachycardic. Chest x-ray also shows cardiomegaly. Suspect mild congestive heart failure as the cause of the patient's symptoms.  6:22 PM: I interpreted/reviewed the labs and/or imaging. Cardiomegaly noted on chest x-ray. Ultrasound negative for DVT. The patient continues to appear well on exam. Will start HCTZ for his peripheral edema. The patient does not currently have a primary care provider, will provide resources for him to followup and establish an appointment within the next one to 2 weeks. I informed him to return for any chest pain, worsening shortness of breath or worsening edema.  I have discussed the diagnosis/risks/treatment options with the patient and believe the pt to be eligible for discharge home to follow-up with and estab w/ a pcp. We also discussed returning to the ED immediately if new or worsening sx occur. We discussed the sx which are most concerning (e.g., worsening sob, cp, fever) that necessitate immediate return. Any new prescriptions provided to the patient are listed below.  New Prescriptions   HYDROCHLOROTHIAZIDE (HYDRODIURIL) 25 MG TABLET    Take 1 tablet  (25 mg total) by mouth daily.     Junius Argyle, MD 05/05/13 [phone]

## 2013-05-05 NOTE — Progress Notes (Signed)
Right lower extremity venous duplex completed.  Right:  No evidence of DVT, superficial thrombosis, or Baker's cyst.  Left:  Negative for DVT in the common femoral vein.  

## 2013-05-05 NOTE — ED Notes (Signed)
Pt reporting over past 2 months swelling to abdomen and right lower leg. Reports today he went to buy shoes and noticed that his right leg/ankle was more swollen than normal. Reports sob, denies any hx of CHF or cp. Pt is alert x 4, speech is clear. Is ambulatory. In NAD

## 2013-05-18 ENCOUNTER — Ambulatory Visit (HOSPITAL_COMMUNITY): Payer: Medicare Other

## 2013-05-18 ENCOUNTER — Other Ambulatory Visit (HOSPITAL_COMMUNITY): Payer: Self-pay | Admitting: Cardiology

## 2013-05-18 DIAGNOSIS — R0609 Other forms of dyspnea: Secondary | ICD-10-CM

## 2013-05-27 ENCOUNTER — Ambulatory Visit (HOSPITAL_COMMUNITY): Payer: Medicare Other

## 2013-10-19 ENCOUNTER — Emergency Department (HOSPITAL_COMMUNITY): Payer: Medicare Other

## 2013-10-19 ENCOUNTER — Encounter (HOSPITAL_COMMUNITY): Payer: Self-pay | Admitting: Emergency Medicine

## 2013-10-19 ENCOUNTER — Inpatient Hospital Stay (HOSPITAL_COMMUNITY)
Admission: EM | Admit: 2013-10-19 | Discharge: 2013-10-21 | DRG: 312 | Disposition: A | Discharge status: Home / Self Care | Payer: Medicare Other | Attending: Internal Medicine | Admitting: Internal Medicine

## 2013-10-19 DIAGNOSIS — E119 Type 2 diabetes mellitus without complications: Secondary | ICD-10-CM | POA: Diagnosis present

## 2013-10-19 DIAGNOSIS — R609 Edema, unspecified: Secondary | ICD-10-CM

## 2013-10-19 DIAGNOSIS — B1920 Unspecified viral hepatitis C without hepatic coma: Secondary | ICD-10-CM | POA: Diagnosis present

## 2013-10-19 DIAGNOSIS — F411 Generalized anxiety disorder: Secondary | ICD-10-CM | POA: Diagnosis present

## 2013-10-19 DIAGNOSIS — F1111 Opioid abuse, in remission: Secondary | ICD-10-CM | POA: Diagnosis present

## 2013-10-19 DIAGNOSIS — G40909 Epilepsy, unspecified, not intractable, without status epilepticus: Secondary | ICD-10-CM

## 2013-10-19 DIAGNOSIS — Z79899 Other long term (current) drug therapy: Secondary | ICD-10-CM

## 2013-10-19 DIAGNOSIS — F1210 Cannabis abuse, uncomplicated: Secondary | ICD-10-CM

## 2013-10-19 DIAGNOSIS — B182 Chronic viral hepatitis C: Secondary | ICD-10-CM | POA: Diagnosis present

## 2013-10-19 DIAGNOSIS — I129 Hypertensive chronic kidney disease with stage 1 through stage 4 chronic kidney disease, or unspecified chronic kidney disease: Secondary | ICD-10-CM | POA: Diagnosis present

## 2013-10-19 DIAGNOSIS — N182 Chronic kidney disease, stage 2 (mild): Secondary | ICD-10-CM

## 2013-10-19 DIAGNOSIS — R55 Syncope and collapse: Principal | ICD-10-CM

## 2013-10-19 DIAGNOSIS — G609 Hereditary and idiopathic neuropathy, unspecified: Secondary | ICD-10-CM | POA: Diagnosis present

## 2013-10-19 HISTORY — DX: Cannabis abuse, uncomplicated: F12.10

## 2013-10-19 LAB — BASIC METABOLIC PANEL
BUN: 17 mg/dL (ref 6–23)
CHLORIDE: 101 meq/L (ref 96–112)
CO2: 23 meq/L (ref 19–32)
Calcium: 8.9 mg/dL (ref 8.4–10.5)
Creatinine, Ser: 1.22 mg/dL (ref 0.50–1.35)
GFR calc Af Amer: 70 mL/min — ABNORMAL LOW (ref >90)
GFR calc non Af Amer: 60 mL/min — ABNORMAL LOW (ref >90)
Glucose, Bld: 121 mg/dL — ABNORMAL HIGH (ref 70–99)
Potassium: 3.7 mEq/L (ref 3.7–5.3)
Sodium: 137 mEq/L (ref 137–147)

## 2013-10-19 LAB — CBC
HCT: 38.3 % — ABNORMAL LOW (ref 39.0–52.0)
Hemoglobin: 13.5 g/dL (ref 13.0–17.0)
MCH: 32.9 pg (ref 26.0–34.0)
MCHC: 35.2 g/dL (ref 30.0–36.0)
MCV: 93.4 fL (ref 78.0–100.0)
PLATELETS: 147 10*3/uL — AB (ref 150–400)
RBC: 4.1 MIL/uL — AB (ref 4.22–5.81)
RDW: 14 % (ref 11.5–15.5)
WBC: 9.4 10*3/uL (ref 4.0–10.5)

## 2013-10-19 LAB — I-STAT TROPONIN, ED: TROPONIN I, POC: 0 ng/mL (ref 0.00–0.08)

## 2013-10-19 MED ORDER — ACETAMINOPHEN 325 MG PO TABS: 650.0000 mg | ORAL_TABLET | Freq: Four times a day (QID) | ORAL | Status: DC | PRN

## 2013-10-19 MED ORDER — ACETAMINOPHEN 650 MG RE SUPP: 650.0000 mg | Freq: Four times a day (QID) | RECTAL | Status: DC | PRN

## 2013-10-19 MED ORDER — RISPERIDONE 0.5 MG PO TABS
0.5000 mg | ORAL_TABLET | Freq: Two times a day (BID) | ORAL | Status: DC
  Administered 2013-10-20 (×3): 0.5 mg via ORAL
  Filled 2013-10-19 (×5): qty 1

## 2013-10-19 MED ORDER — GABAPENTIN 600 MG PO TABS
600.0000 mg | ORAL_TABLET | Freq: Three times a day (TID) | ORAL | Status: DC
  Administered 2013-10-20 (×4): 600 mg via ORAL
  Filled 2013-10-19 (×7): qty 1

## 2013-10-19 MED ORDER — ENOXAPARIN SODIUM 60 MG/0.6ML ~~LOC~~ SOLN
60.0000 mg | SUBCUTANEOUS | Status: DC
Start: 2013-10-20 — End: 2013-10-21
  Administered 2013-10-20 (×2): 60 mg via SUBCUTANEOUS
  Filled 2013-10-19 (×3): qty 0.6

## 2013-10-19 MED ORDER — SODIUM CHLORIDE 0.9 % IJ SOLN
3.0000 mL | Freq: Two times a day (BID) | INTRAMUSCULAR | Status: DC
  Administered 2013-10-20 (×3): 3 mL via INTRAVENOUS

## 2013-10-19 MED ORDER — METOCLOPRAMIDE HCL 10 MG PO TABS
10.0000 mg | ORAL_TABLET | Freq: Four times a day (QID) | ORAL | Status: DC
  Administered 2013-10-20 – 2013-10-21 (×3): 10 mg via ORAL
  Filled 2013-10-19 (×10): qty 1

## 2013-10-19 MED ORDER — CLONAZEPAM 0.5 MG PO TABS
0.5000 mg | ORAL_TABLET | Freq: Three times a day (TID) | ORAL | Status: DC | PRN
  Administered 2013-10-20: 0.5 mg via ORAL
  Filled 2013-10-19: qty 1

## 2013-10-19 MED ORDER — ONDANSETRON HCL 4 MG/2ML IJ SOLN: 4.0000 mg | Freq: Four times a day (QID) | INTRAMUSCULAR | Status: DC | PRN

## 2013-10-19 MED ORDER — ONDANSETRON HCL 4 MG PO TABS
4.0000 mg | ORAL_TABLET | Freq: Four times a day (QID) | ORAL | Status: DC | PRN
Start: 2013-10-19 — End: 2013-10-21

## 2013-10-19 MED ORDER — INSULIN ASPART 100 UNIT/ML ~~LOC~~ SOLN: 0.0000 [IU] | Freq: Three times a day (TID) | SUBCUTANEOUS | Status: DC

## 2013-10-19 MED ORDER — LEVETIRACETAM 500 MG PO TABS
500.0000 mg | ORAL_TABLET | Freq: Two times a day (BID) | ORAL | Status: DC
  Administered 2013-10-20 (×3): 500 mg via ORAL
  Filled 2013-10-19 (×5): qty 1

## 2013-10-19 NOTE — ED Notes (Signed)
Portable Chest Xray at the bedside.  

## 2013-10-19 NOTE — ED Notes (Signed)
Pt presents to ED via [facility], 911 was called due to friends witnessed pt have a syncopal event and unresponsive. Friends reported pt was sitting down on concrete steps pt fell over to his side and friends were unable to wake pt up. Upon EMS arrival the [facility] reported pt was unresponsive on their arrival, they performed 40 chest compressions and assisted with airway ventilation, pt then awake, alert and oriented x4. Pt does admits the last thing he was remembers doing was smoking marijuana with his friends. Pt does report some chest discomfort from chest compressions, pt denies any other symptoms. Skin warm and dry. Girlfriend reported to EMS pt had a similar incident in the past while using drugs. Pt has a hx of chronic drug abuse of IV heroin, marijuana, and off of Methadone for 1 year. 24 g RH SL. VS CBG 144, BP 128/74, HR 84-90. FD reported approx downtime of 5-10 mins

## 2013-10-19 NOTE — ED Notes (Signed)
MD Aline Brochure at the bedside updating the pt.

## 2013-10-19 NOTE — H&P (Signed)
[facility] History and Physical  Terry Baxter GEX:[id number] DOB: 1945-11-04 DOA: 10/19/2013   PCP: Patricia Nettle, MD   Chief Complaint:  Syncope  HPI:  68 year old male with a history of diabetes mellitus, seizure disorder, chronic hepatitis C, hypertension, previous history of heroin use,and marijuana usage presents after a syncopal episode. The patient was sitting on his porch drinking a Pepsi and smoking marijuana in celebration of his one-year history of being off of methadone. The patient took methadone up until 10/19/2012 due to previous history of heroin use. The patient passed out. EMS was activated. According to EDP, chest compressions were initiated by EMS. Suddenly, the patient woke up without any deficits. There was no urine or bowel incontinence. The patient states that he has had these current episodes in the past. He states that this is the first time he has smoked marijuana in the past 6 months. He denies any other illegal drug use at this time. He does not smoke tobacco. He states that he has been short of breath for the past year, but states that that he is not any more short of breath than usual today. He denies any fevers, chills, chest discomfort, coughing, hemoptysis, nausea, vomiting, diarrhea. There is no headache, visual disturbance, or focal extremity weakness. He has no new speech difficulty, although he has chronically garbled speech. He feels that his speech is normal today.  In the ED, the patient was hemodynamically stable. His heart rate was in the 50s, and the patient was afebrile. BMP showed serum creatinine 1.22. CBC was unremarkable except for platelets of 147,000. EKG showed sinus rhythm with non-specific T wave changes.  i-stat troponin was negative. Assessment/Plan:  Syncope -Suspicious for seizure other cardiac cause cannot be ruled out -Cycle troponins -Place on telemetry -EEG -Echocardiogram -Low suspicion for pulmonary embolus Polysubstance  abuse -Urine drug screen -Cessation discussed Hypertension -Discontinue HCTZ as his blood pressure is soft Seizure disorder -Continue Keppra -EEG Anxiety -Continue Klonopin -Continue Risperdal -Patient received psychiatric services [facility] CKD stage 2 -baseline creatinine 1.0-1.3 Lower extremity pain and edema -The patient has a history of peripheral neuropathy -Venous duplex r/o DVT     Past Medical History  Diagnosis Date  . Hepatitis C   . Diabetes mellitus   . Hypertension    History reviewed. No pertinent past surgical history. Social History:  reports that he has never smoked. He does not have any smokeless tobacco history on file. He reports that he does not drink alcohol or use illicit drugs.   History reviewed. No pertinent family history.   No Known Allergies    Prior to Admission medications   Medication Sig Start Date End Date Taking? Authorizing Provider  clonazePAM (KLONOPIN) 0.5 MG tablet Take 0.5 mg by mouth 3 (three) times daily as needed for anxiety.   Yes Historical Provider, MD  gabapentin (NEURONTIN) 600 MG tablet Take 600 mg by mouth 3 (three) times daily.   Yes Historical Provider, MD  hydrochlorothiazide (HYDRODIURIL) 25 MG tablet Take 1 tablet (25 mg total) by mouth daily. 05/05/13  Yes Blanchard Kelch, MD  levETIRAcetam (KEPPRA) 500 MG tablet Take 500 mg by mouth 2 (two) times daily.   Yes Historical Provider, MD  risperiDONE (RISPERDAL) 0.5 MG tablet Take 0.5 mg by mouth 2 (two) times daily.   Yes Historical Provider, MD  dicyclomine (BENTYL) 20 MG tablet Take 1 tablet (20 mg total) by mouth 2 (two) times daily. 11/24/11 11/23/12  Sheliah Mends, PA-C  metoCLOPramide (  REGLAN) 10 MG tablet Take 1 tablet (10 mg total) by mouth every 6 (six) hours. 11/24/11 12/04/11  Sheliah Mends, PA-C    Review of Systems:  Constitutional:  No weight loss, night sweats, Fevers, chills, fatigue.  Head&Eyes: No headache.  No  vision loss.  No eye pain or scotoma ENT:  No Difficulty swallowing,Tooth/dental problems,Sore throat,  No ear ache, post nasal drip,  Cardio-vascular:  No chest pain, Orthopnea, PND,dizziness, palpitations  GI:  No  abdominal pain, nausea, vomiting, diarrhea, loss of appetite, hematochezia, melena, heartburn, indigestion, Resp:   No cough. No coughing up of blood .No wheezing.No chest wall deformity  Skin:  no rash or lesions.  GU:  no dysuria, change in color of urine, no urgency or frequency. No flank pain.  Musculoskeletal:  No joint pain or swelling. No decreased range of motion. No back pain.  Psych:  No change in mood or affect.  Neurologic: No headache, no dysesthesia, no focal weakness, no vision loss.  Physical Exam: Filed Vitals:   10/19/13 1915 10/19/13 2000 10/19/13 2030 10/19/13 2045  BP: 96/63 101/54 100/54 107/64  Pulse: 68 59 57 53  Temp:      TempSrc:      Resp: 21 18 18 17   SpO2: 96% 96% 97% 95%   General:  A&O x 3, NAD, nontoxic, pleasant/cooperative Head/Eye: No conjunctival hemorrhage, no icterus, [place]/AT, No nystagmus ENT:  No icterus,  No thrush,  no pharyngeal exudate Neck:  No masses, no lymphadenpathy, no bruits CV:  RRR, no rub, no gallop, no S3 Lung:  CTAB, good air movement, no wheeze, no rhonchi Abdomen: soft/NT, +BS, nondistended, no peritoneal signs Ext: No cyanosis, No rashes, No petechiae, No lymphangitis, No edema Neuro: CNII-XII intact, strength 4/5 in bilateral upper and lower extremities, no dysmetria  Labs on Admission:  Basic Metabolic Panel:  Recent Labs Lab 10/19/13 1926  NA 137  K 3.7  CL 101  CO2 23  GLUCOSE 121*  BUN 17  CREATININE 1.22  CALCIUM 8.9   Liver Function Tests: No results found for this basename: AST, ALT, ALKPHOS, BILITOT, PROT, ALBUMIN,  in the last 168 hours No results found for this basename: LIPASE, AMYLASE,  in the last 168 hours No results found for this basename: AMMONIA,  in the last 168  hours CBC:  Recent Labs Lab 10/19/13 1926  WBC 9.4  HGB 13.5  HCT 38.3*  MCV 93.4  PLT 147*   Cardiac Enzymes: No results found for this basename: CKTOTAL, CKMB, CKMBINDEX, TROPONINI,  in the last 168 hours BNP: No components found with this basename: POCBNP,  CBG: No results found for this basename: GLUCAP,  in the last 168 hours  Radiological Exams on Admission: Dg Chest Port 1 View  10/19/2013   CLINICAL DATA:  CARDIAC ARREST  EXAM: PORTABLE CHEST - 1 VIEW  COMPARISON:  DG CHEST 2 VIEW dated 05/05/2013  FINDINGS: The heart size and mediastinal contours are within normal limits. Both lungs are clear. The visualized skeletal structures are unremarkable.  IMPRESSION: No active disease.   Electronically Signed   By: Margaree Mackintosh M.D.   On: 10/19/2013 20:31    EKG: Independently reviewed. Sinus rhythm, nonspecific ST-T wave changes    Time spent:60 minutes Code Status:   FULL Family Communication:   No Family at bedside   Naisha Wisdom, DO  [facility] Pager [phone]  If 7PM-7AM, please contact night-coverage [email or web address] Password [facility] 10/19/2013, 10:33 PM

## 2013-10-19 NOTE — ED Provider Notes (Signed)
CSN: [id number]     Arrival date & time 10/19/13  1900 History   First MD Initiated Contact with Patient 10/19/13 1916     Chief Complaint  Patient presents with  . Cardiac Arrest     (Consider location/radiation/quality/duration/timing/severity/associated sxs/prior Treatment) Patient is a 68 y.o. male presenting with syncope. The history is provided by the patient.  Loss of Consciousness Episode history:  Single Most recent episode:  Today Duration: 5-10 minutes. Timing: once. Progression:  Resolved Chronicity: He reports one similar episode that cannot describe the situation surrounding that syncopal episode. Context comment:  While smoking marijuana.  Witnessed: yes   Relieved by:  Nothing Worsened by:  Nothing tried Ineffective treatments:  None tried Associated symptoms: no chest pain, no fever, no headaches, no nausea, no shortness of breath and no vomiting     Past Medical History  Diagnosis Date  . Hepatitis C   . Diabetes mellitus   . Hypertension    History reviewed. No pertinent past surgical history. History reviewed. No pertinent family history. History  Substance Use Topics  . Smoking status: Never Smoker   . Smokeless tobacco: Not on file  . Alcohol Use: No    Review of Systems  Constitutional: Negative for fever.  HENT: Negative for drooling and rhinorrhea.   Eyes: Negative for pain.  Respiratory: Negative for cough and shortness of breath.   Cardiovascular: Positive for syncope. Negative for chest pain and leg swelling.  Gastrointestinal: Negative for nausea, vomiting, abdominal pain and diarrhea.  Genitourinary: Negative for dysuria and hematuria.  Musculoskeletal: Negative for gait problem and neck pain.  Skin: Negative for color change.  Neurological: Negative for numbness and headaches.  Hematological: Negative for adenopathy.  Psychiatric/Behavioral: Negative for behavioral problems.  All other systems reviewed and are  negative.      Allergies  Review of patient's allergies indicates no known allergies.  Home Medications   Current Outpatient Rx  Name  Route  Sig  Dispense  Refill  . clonazePAM (KLONOPIN) 0.5 MG tablet   Oral   Take 0.5 mg by mouth 3 (three) times daily as needed for anxiety.         Marland Kitchen EXPIRED: dicyclomine (BENTYL) 20 MG tablet   Oral   Take 1 tablet (20 mg total) by mouth 2 (two) times daily.   20 tablet   0   . gabapentin (NEURONTIN) 600 MG tablet   Oral   Take 600 mg by mouth 3 (three) times daily.         . hydrochlorothiazide (HYDRODIURIL) 25 MG tablet   Oral   Take 1 tablet (25 mg total) by mouth daily.   30 tablet   0   . levETIRAcetam (KEPPRA) 500 MG tablet   Oral   Take 500 mg by mouth 2 (two) times daily.         Marland Kitchen EXPIRED: metoCLOPramide (REGLAN) 10 MG tablet   Oral   Take 1 tablet (10 mg total) by mouth every 6 (six) hours.   30 tablet   0   . risperiDONE (RISPERDAL) 0.5 MG tablet   Oral   Take 0.5 mg by mouth 2 (two) times daily.          BP 92/62  Pulse 67  Temp(Src) 97 F (36.1 C) (Oral)  Resp 12  SpO2 98% Physical Exam  Nursing note and vitals reviewed. Constitutional: He is oriented to person, place, and time. He appears well-developed and well-nourished.  HENT:  Head: Normocephalic  and atraumatic.  Right Ear: External ear normal.  Left Ear: External ear normal.  Nose: Nose normal.  Mouth/Throat: Oropharynx is clear and moist. No oropharyngeal exudate.  Eyes: Conjunctivae and EOM are normal. Pupils are equal, round, and reactive to light.  Neck: Normal range of motion. Neck supple.  Cardiovascular: Normal rate, regular rhythm, normal heart sounds and intact distal pulses.  Exam reveals no gallop and no friction rub.   No murmur heard. Pulmonary/Chest: Effort normal and breath sounds normal. No respiratory distress. He has no wheezes.  Abdominal: Soft. Bowel sounds are normal. He exhibits no distension. There is no  tenderness. There is no rebound and no guarding.  Musculoskeletal: Normal range of motion. He exhibits no edema and no tenderness.  Neurological: He is alert and oriented to person, place, and time. He has normal strength. No cranial nerve deficit or sensory deficit. He displays a negative Romberg sign. Coordination and gait normal.  alert, oriented x3 speech: normal in context and clarity memory: intact grossly cranial nerves II-XII: intact motor strength: full proximally and distally no involuntary movements or tremors sensation: intact to light touch diffusely  cerebellar: finger-to-nose and heel-to-shin intact gait: normal forwards and backwards   Skin: Skin is warm and dry.  Psychiatric: He has a normal mood and affect. His behavior is normal.    ED Course  Procedures (including critical care time) Labs Review Labs Reviewed  CBC - Abnormal; Notable for the following:    RBC 4.10 (*)    HCT 38.3 (*)    Platelets 147 (*)    All other components within normal limits  BASIC METABOLIC PANEL - Abnormal; Notable for the following:    Glucose, Bld 121 (*)    GFR calc non Af Amer 60 (*)    GFR calc Af Amer 70 (*)    All other components within normal limits  URINE RAPID DRUG SCREEN (HOSP PERFORMED)  Randolm Idol, ED   Imaging Review Dg Chest Port 1 View  10/19/2013   CLINICAL DATA:  CARDIAC ARREST  EXAM: PORTABLE CHEST - 1 VIEW  COMPARISON:  DG CHEST 2 VIEW dated 05/05/2013  FINDINGS: The heart size and mediastinal contours are within normal limits. Both lungs are clear. The visualized skeletal structures are unremarkable.  IMPRESSION: No active disease.   Electronically Signed   By: Margaree Mackintosh M.D.   On: 10/19/2013 20:31     EKG Interpretation   Date/Time:  Wednesday October 19 2013 19:09:32 EDT Ventricular Rate:  67 PR Interval:  193 QRS Duration: 94 QT Interval:  421 QTC Calculation: 444 R Axis:   15 Text Interpretation:  Age not entered, assumed to be  68 years  old for  purpose of ECG interpretation Sinus rhythm Probable left atrial  enlargement No significant change since last tracing Confirmed by Rigdon Macomber   MD, Shaunette Gassner ([id number]) on 10/19/2013 7:21:13 PM      MDM   Final diagnoses:  Syncope    8:20 PM 68 y.o. male w hx of hep C, DM, HTN who pw syncopal episode. Per report, the patient was drinking soda and sitting on concrete steps when he suddenly syncopized and became unresponsive. He states that he had been smoking marijuana. EMS was called and upon their arrival they found him unresponsive. They performed approximately 40 chest compressions and assisted with airway ventilation. The patient then became awake and more alert. Upon arrival to the ER he was alert and oriented x4. The last thing he remembers is smoking  marijuana with his friends. He currently denies any shortness of breath or chest pain on my exam. He is currently asymptomatic on exam. He has a normal neurologic exam currently. He has a mildly soft blood pressure but vital signs are otherwise unremarkable. Will get screening labs and imaging.  [facility] Any medications given in the ED during this visit are listed below:  Medications - No data to display    Blanchard Kelch, MD 10/19/13 2219

## 2013-10-20 ENCOUNTER — Inpatient Hospital Stay (HOSPITAL_COMMUNITY): Payer: Medicare Other

## 2013-10-20 DIAGNOSIS — M79609 Pain in unspecified limb: Secondary | ICD-10-CM

## 2013-10-20 DIAGNOSIS — I517 Cardiomegaly: Secondary | ICD-10-CM

## 2013-10-20 DIAGNOSIS — M7989 Other specified soft tissue disorders: Secondary | ICD-10-CM

## 2013-10-20 DIAGNOSIS — R55 Syncope and collapse: Principal | ICD-10-CM

## 2013-10-20 LAB — CBC
HCT: 37.1 % — ABNORMAL LOW (ref 39.0–52.0)
HEMOGLOBIN: 12.9 g/dL — AB (ref 13.0–17.0)
MCH: 32.3 pg (ref 26.0–34.0)
MCHC: 34.8 g/dL (ref 30.0–36.0)
MCV: 92.8 fL (ref 78.0–100.0)
PLATELETS: 146 10*3/uL — AB (ref 150–400)
RBC: 4 MIL/uL — ABNORMAL LOW (ref 4.22–5.81)
RDW: 14.1 % (ref 11.5–15.5)
WBC: 8.2 10*3/uL (ref 4.0–10.5)

## 2013-10-20 LAB — TROPONIN I
Troponin I: <0.3 ng/mL (ref <0.30)
Troponin I: <0.3 ng/mL (ref <0.30)
Troponin I: <0.3 ng/mL (ref <0.30)

## 2013-10-20 LAB — RAPID URINE DRUG SCREEN, HOSP PERFORMED
Amphetamines: NOT DETECTED
Barbiturates: NOT DETECTED
Benzodiazepines: NOT DETECTED
COCAINE: NOT DETECTED
Opiates: NOT DETECTED
Tetrahydrocannabinol: POSITIVE — AB

## 2013-10-20 LAB — BASIC METABOLIC PANEL
BUN: 14 mg/dL (ref 6–23)
CHLORIDE: 106 meq/L (ref 96–112)
CO2: 21 meq/L (ref 19–32)
Calcium: 8.7 mg/dL (ref 8.4–10.5)
Creatinine, Ser: 1.07 mg/dL (ref 0.50–1.35)
GFR calc Af Amer: 82 mL/min — ABNORMAL LOW (ref >90)
GFR calc non Af Amer: 70 mL/min — ABNORMAL LOW (ref >90)
GLUCOSE: 85 mg/dL (ref 70–99)
Potassium: 3.8 mEq/L (ref 3.7–5.3)
Sodium: 139 mEq/L (ref 137–147)

## 2013-10-20 LAB — GLUCOSE, CAPILLARY
GLUCOSE-CAPILLARY: 84 mg/dL (ref 70–99)
GLUCOSE-CAPILLARY: 88 mg/dL (ref 70–99)
Glucose-Capillary: 83 mg/dL (ref 70–99)
Glucose-Capillary: 91 mg/dL (ref 70–99)

## 2013-10-20 LAB — HEMOGLOBIN A1C
HEMOGLOBIN A1C: 5.5 % (ref <5.7)
Mean Plasma Glucose: 111 mg/dL (ref <117)

## 2013-10-20 NOTE — Procedures (Signed)
EEG report.  Brief clinical history: 68 year old male with a history of diabetes mellitus, seizure disorder, chronic hepatitis C, hypertension, previous history of heroin use,and marijuana usage presents after a syncopal episode  Technique: this is a 17 channel routine scalp EEG performed at the bedside with bipolar and monopolar montages arranged in accordance to the international 10/20 system of electrode placement. One channel was dedicated to EKG recording.  The study was performed during wakefulness and drowsiness. Hyperventilation and intermittent photic stimulation were utilized as activating procedures.  Description:In the wakeful state, the best background consisted of a medium amplitude, posterior dominant, well sustained, symmetric and reactive 12 Hz rhythm. Drowsiness demonstrated dropout of the alpha rhythm. Hyperventilation induced mild, diffuse, physiologic slowing but no epileptiform discharges. Intermittent photic stimulation did induce a normal driving response.  No focal or generalized epileptiform discharges noted.  No slowing seen.  EKG showed sinus rhythm.  Impression: this is a normal awake and drowsy EEG. Please, be aware that a normal EEG does not exclude the possibility of epilepsy.  Clinical correlation is advised.  Dorian Pod, MD

## 2013-10-20 NOTE — Progress Notes (Signed)
PROGRESS NOTE  Terry Baxter YOV:[id number] DOB: [birth year] DOA: 10/19/2013 PCP: Patricia Nettle, MD  Assessment/Plan: Syncope - in the setting of using THC. EEG pending. Continue to monitor on telemetry, 2D echo pending.  Polysubstance abuse Urine drug screen. Cessation discussed  Hypertension - Discontinue HCTZ as his blood pressure is soft  Seizure disorder -Continue Keppra  -EEG  Anxiety -Continue Klonopin. Continue Risperdal  -Patient received psychiatric services from [facility]  CKD stage 2 -baseline creatinine 1.0-1.3  Lower extremity pain and edema  -The patient has a history of peripheral neuropathy  -Venous duplex r/o DVT pending  Diet: heart Fluids: none DVT Prophylaxis: Lovenox  Code Status: Full Family Communication: d/w patient  Disposition Plan: inpatient  Consultants:  none  Procedures:  none   Antibiotics - none  HPI/Subjective: No complaints this morning, no chest pain, no SOB  Objective: Filed Vitals:   10/19/13 2030 10/19/13 2045 10/19/13 2326 10/20/13 0447  BP: 100/54 107/64 112/62 104/69  Pulse: 57 53 57 55  Temp:   97.6 F (36.4 C) 97.5 F (36.4 C)  TempSrc:   Oral Oral  Resp: 18 17 18 18   Height:   5\' 8"  (1.727 m)   Weight:   122.7 kg (270 lb 8.1 oz)   SpO2: 97% 95% 95% 95%    Intake/Output Summary (Last 24 hours) at 10/20/13 1221 Last data filed at 10/20/13 1132  Gross per 24 hour  Intake      0 ml  Output    675 ml  Net   -675 ml   Filed Weights   10/19/13 2326  Weight: 122.7 kg (270 lb 8.1 oz)    Exam:  General:  NAD  Cardiovascular: regular rate and rhythm, without MRG  Respiratory: good air movement, clear to auscultation throughout, no wheezing, ronchi or rales  Abdomen: soft, not tender to palpation, positive bowel sounds  MSK: no peripheral edema  Neuro: CN 2-12 grossly intact, MS 5/5 in all 4  Data Reviewed: Basic Metabolic Panel:  Recent Labs Lab 10/19/13 1926  10/20/13 0505  NA 137 139  K 3.7 3.8  CL 101 106  CO2 23 21  GLUCOSE 121* 85  BUN 17 14  CREATININE 1.22 1.07  CALCIUM 8.9 8.7   Liver Function Tests: No results found for this basename: AST, ALT, ALKPHOS, BILITOT, PROT, ALBUMIN,  in the last 168 hours No results found for this basename: LIPASE, AMYLASE,  in the last 168 hours No results found for this basename: AMMONIA,  in the last 168 hours CBC:  Recent Labs Lab 10/19/13 1926 10/20/13 0505  WBC 9.4 8.2  HGB 13.5 12.9*  HCT 38.3* 37.1*  MCV 93.4 92.8  PLT 147* 146*   Cardiac Enzymes:  Recent Labs Lab 10/20/13 0145 10/20/13 0507  TROPONINI <0.30 <0.30   BNP (last 3 results)  Recent Labs  05/05/13 1352  PROBNP 240.7*   CBG:  Recent Labs Lab 10/20/13 0629 10/20/13 1129  GLUCAP 84 83    No results found for this or any previous visit (from the past 240 hour(s)).   Studies: Dg Chest Port 1 View  10/19/2013   CLINICAL DATA:  CARDIAC ARREST  EXAM: PORTABLE CHEST - 1 VIEW  COMPARISON:  DG CHEST 2 VIEW dated 05/05/2013  FINDINGS: The heart size and mediastinal contours are within normal limits. Both lungs are clear. The visualized skeletal structures are unremarkable.  IMPRESSION: No active disease.   Electronically Signed   By: Margaree Mackintosh M.D.  On: 10/19/2013 20:31    Scheduled Meds: . enoxaparin (LOVENOX) injection  60 mg Subcutaneous Q24H  . gabapentin  600 mg Oral TID  . insulin aspart  0-9 Units Subcutaneous TID WC  . levETIRAcetam  500 mg Oral BID  . metoCLOPramide  10 mg Oral Q6H  . risperiDONE  0.5 mg Oral BID  . sodium chloride  3 mL Intravenous Q12H   Continuous Infusions:   Active Problems:   Hepatitis C   Syncope   Cannabis abuse   Seizure disorder   CKD (chronic kidney disease) stage 2, GFR 60-89 ml/min   Time spent: 35  This note has been created with Surveyor, quantity. Any transcriptional errors are unintentional.   Marzetta Board, MD [facility] Pager [phone]. If 7 PM - 7 AM, please contact night-coverage at [email or web address], password [facility] 10/20/2013, 12:21 PM  LOS: 1 day

## 2013-10-20 NOTE — Progress Notes (Signed)
  Echocardiogram 2D Echocardiogram has been performed.  Donata Clay 10/20/2013, 10:44 AM

## 2013-10-20 NOTE — Progress Notes (Signed)
VASCULAR LAB PRELIMINARY  PRELIMINARY  PRELIMINARY  PRELIMINARY  Bilateral lower extremity venous Dopplers completed.    Preliminary report:  There is no obvious evidence of DVT or SVT noted in the bilateral lower extremities.  Chapman Matteucci, RVT 10/20/2013, 12:09 PM

## 2013-10-20 NOTE — Progress Notes (Signed)
EEG Completed; Results Pending  

## 2013-10-21 LAB — GLUCOSE, CAPILLARY
GLUCOSE-CAPILLARY: 101 mg/dL — AB (ref 70–99)
GLUCOSE-CAPILLARY: 58 mg/dL — AB (ref 70–99)

## 2013-10-21 NOTE — Discharge Summary (Signed)
Physician Discharge Summary  Terry Baxter ZOX:[id number] DOB: 04-14-1946 DOA: 10/19/2013  PCP: Patricia Nettle, MD  Admit date: 10/19/2013 Discharge date: 10/21/2013  Time spent: 35 minutes  Recommendations for Outpatient Follow-up:  1. Followup with primary care provider in one to 2 weeks   Discharge Diagnoses:  Active Problems:   Hepatitis C   Syncope   Cannabis abuse   Seizure disorder   CKD (chronic kidney disease) stage 2, GFR 60-89 ml/min  Discharge Condition: Stable  Diet recommendation: Regular  Filed Weights   10/19/13 2326  Weight: 122.7 kg (270 lb 8.1 oz)   History of present illness:  68 year old male with a history of diabetes mellitus, seizure disorder, chronic hepatitis C, hypertension, previous history of heroin use,and marijuana usage presents after a syncopal episode. The patient was sitting on his porch drinking a Pepsi and smoking marijuana in celebration of his one-year history of being off of methadone. The patient took methadone up until 10/19/2012 due to previous history of heroin use. The patient passed out. EMS was activated. According to EDP, chest compressions were initiated by EMS. Suddenly, the patient woke up without any deficits. There was no urine or bowel incontinence. The patient states that he has had these current episodes in the past. He states that this is the first time he has smoked marijuana in the past 6 months. He denies any other illegal drug use at this time. He does not smoke tobacco. He states that he has been short of breath for the past year, but states that that he is not any more short of breath than usual today. He denies any fevers, chills, chest discomfort, coughing, hemoptysis, nausea, vomiting, diarrhea. There is no headache, visual disturbance, or focal extremity weakness. He has no new speech difficulty, although he has chronically garbled speech. He feels that his speech is normal today. In the ED, the patient was  hemodynamically stable. His heart rate was in the 50s, and the patient was afebrile. BMP showed serum creatinine 1.22. CBC was unremarkable except for platelets of 147,000. EKG showed sinus rhythm with non-specific T wave changes. i-stat troponin was negative.  Hospital Course:  Syncope - in the setting of using THC.  Patient EKG on admission shows sinus rhythm. He was admitted and monitored on telemetry, and no events were noted. He underwent an EEG given his seizure history which was essentially normal ( for full read below). He underwent a 2-D echo which showed normal systolic function, no wall motion abnormalities. Cardiac enzymes remain negative. Patient's symptoms have completely resolved, and he had no chest pain, lightheadedness or dizziness.  Polysubstance abuse Urine drug screen. Cessation discussed. Hypertension - patient normotensive, to resume previous home medications. Seizure disorder -Continue Keppra. EEG normal as below. Anxiety -Continue Klonopin. Continue Risperdal  -Patient received psychiatric services from [facility]  CKD stage 2 -baseline creatinine 1.0-1.3.creatinine at baseline during this hospitalization.  Lower extremity pain and edema  -The patient has a history of peripheral neuropathy  -Venous duplex r/o DVT without evidence of blood clot   Procedures:  EEG Impression: this is a normal awake and drowsy EEG. Please, be aware that a normal EEG does not exclude the possibility of epilepsy.   2D echo Study Conclusions - Left ventricle: The cavity size was normal. Wall thickness was increased in a pattern of mild LVH. Systolic function was normal. The estimated ejection fraction was in the range of 55% to 60%. Wall motion was normal; there were no regional wall  motion abnormalities. Doppler parameters are consistent with abnormal left ventricular relaxation (grade 1 diastolic dysfunction). - Left atrium: The atrium was mildly dilated. - Right  atrium: The atrium was mildly dilated. - Pericardium, extracardiac: A trivial pericardial effusion was identified.   LE venous duplex Summary: - No obvious evidence of deep vein or superficial thrombosis involving the right lower extremity and left lower extremity. - No evidence of Baker's cyst on the right or left.  Consultations:  None  Discharge Exam: Filed Vitals:   10/20/13 0447 10/20/13 1417 10/20/13 2128 10/21/13 0600  BP: 104/69 112/64 102/59 118/78  Pulse: 55 57 51 57  Temp: 97.5 F (36.4 C) 98.6 F (37 C) 98.2 F (36.8 C) 97.4 F (36.3 C)  TempSrc: Oral Oral Oral Oral  Resp: 18 20 18 18   Height:      Weight:      SpO2: 95% 98% 98% 97%   General: No acute distress  Cardiovascular: Regular rate and rhythm  Respiratory: Clear to auscultation bilaterally  Discharge Instructions    Medication List         clonazePAM 0.5 MG tablet  Commonly known as:  KLONOPIN  Take 0.5 mg by mouth 3 (three) times daily as needed for anxiety.     dicyclomine 20 MG tablet  Commonly known as:  BENTYL  Take 1 tablet (20 mg total) by mouth 2 (two) times daily.     gabapentin 600 MG tablet  Commonly known as:  NEURONTIN  Take 600 mg by mouth 3 (three) times daily.     hydrochlorothiazide 25 MG tablet  Commonly known as:  HYDRODIURIL  Take 1 tablet (25 mg total) by mouth daily.     levETIRAcetam 500 MG tablet  Commonly known as:  KEPPRA  Take 500 mg by mouth 2 (two) times daily.     metoCLOPramide 10 MG tablet  Commonly known as:  REGLAN  Take 1 tablet (10 mg total) by mouth every 6 (six) hours.     risperiDONE 0.5 MG tablet  Commonly known as:  RISPERDAL  Take 0.5 mg by mouth 2 (two) times daily.           Follow-up Information   Follow up with Patricia Nettle, MD. Schedule an appointment as soon as possible for a visit in 1 week.   Specialty:  Cardiology   Contact information:   [place] [address] [phone]       The results of  significant diagnostics from this hospitalization (including imaging, microbiology, ancillary and laboratory) are listed below for reference.    Significant Diagnostic Studies: Dg Chest Port 1 View  10/19/2013   CLINICAL DATA:  CARDIAC ARREST  EXAM: PORTABLE CHEST - 1 VIEW  COMPARISON:  DG CHEST 2 VIEW dated 05/05/2013  FINDINGS: The heart size and mediastinal contours are within normal limits. Both lungs are clear. The visualized skeletal structures are unremarkable.  IMPRESSION: No active disease.   Electronically Signed   By: Margaree Mackintosh M.D.   On: 10/19/2013 20:31   Labs: Basic Metabolic Panel:  Recent Labs Lab 10/19/13 1926 10/20/13 0505  NA 137 139  K 3.7 3.8  CL 101 106  CO2 23 21  GLUCOSE 121* 85  BUN 17 14  CREATININE 1.22 1.07  CALCIUM 8.9 8.7   CBC:  Recent Labs Lab 10/19/13 1926 10/20/13 0505  WBC 9.4 8.2  HGB 13.5 12.9*  HCT 38.3* 37.1*  MCV 93.4 92.8  PLT 147* 146*  Cardiac Enzymes:  Recent Labs Lab 10/20/13 0145 10/20/13 0507 10/20/13 1305  TROPONINI <0.30 <0.30 <0.30   BNP: BNP (last 3 results)  Recent Labs  05/05/13 1352  PROBNP 240.7*   CBG:  Recent Labs Lab 10/20/13 1129 10/20/13 1614 10/20/13 2125 10/21/13 0647 10/21/13 0710  GLUCAP 83 91 88 58* 101*    Signed:  Calah Gershman  [facility] 10/21/2013, 10:56 AM

## 2013-10-21 NOTE — Progress Notes (Signed)
Hypoglycemic Event  CBG:58  Treatment:2 cups juice  Symptoms:none  Follow-up CBG: Time:0710 CBG Result:101  Possible Reasons for Event:unknown  Comments/MD notified:none    Terry Baxter, Colston Pyle [place]  Remember to initiate Hypoglycemia Order Set & complete

## 2013-10-21 NOTE — Discharge Instructions (Signed)

## 2014-03-06 ENCOUNTER — Emergency Department (HOSPITAL_COMMUNITY)
Admission: EM | Admit: 2014-03-06 | Discharge: 2014-03-06 | Disposition: A | Discharge status: Home / Self Care | Payer: Medicare Other | Attending: Emergency Medicine | Admitting: Emergency Medicine

## 2014-03-06 ENCOUNTER — Encounter (HOSPITAL_COMMUNITY): Payer: Self-pay | Admitting: Emergency Medicine

## 2014-03-06 DIAGNOSIS — R5383 Other fatigue: Secondary | ICD-10-CM | POA: Diagnosis present

## 2014-03-06 DIAGNOSIS — I10 Essential (primary) hypertension: Secondary | ICD-10-CM | POA: Insufficient documentation

## 2014-03-06 DIAGNOSIS — Z8619 Personal history of other infectious and parasitic diseases: Secondary | ICD-10-CM | POA: Diagnosis not present

## 2014-03-06 DIAGNOSIS — Z79899 Other long term (current) drug therapy: Secondary | ICD-10-CM | POA: Diagnosis not present

## 2014-03-06 DIAGNOSIS — E119 Type 2 diabetes mellitus without complications: Secondary | ICD-10-CM | POA: Diagnosis not present

## 2014-03-06 DIAGNOSIS — R5381 Other malaise: Secondary | ICD-10-CM | POA: Diagnosis not present

## 2014-03-06 DIAGNOSIS — R531 Weakness: Secondary | ICD-10-CM

## 2014-03-06 LAB — CBC WITH DIFFERENTIAL/PLATELET
BASOS PCT: 0 % (ref 0–1)
Basophils Absolute: 0 10*3/uL (ref 0.0–0.1)
EOS ABS: 0 10*3/uL (ref 0.0–0.7)
Eosinophils Relative: 0 % (ref 0–5)
HCT: 36.4 % — ABNORMAL LOW (ref 39.0–52.0)
Hemoglobin: 12.3 g/dL — ABNORMAL LOW (ref 13.0–17.0)
Lymphocytes Relative: 8 % — ABNORMAL LOW (ref 12–46)
Lymphs Abs: 0.8 10*3/uL (ref 0.7–4.0)
MCH: 30.7 pg (ref 26.0–34.0)
MCHC: 33.8 g/dL (ref 30.0–36.0)
MCV: 90.8 fL (ref 78.0–100.0)
Monocytes Absolute: 0.6 10*3/uL (ref 0.1–1.0)
Monocytes Relative: 5 % (ref 3–12)
NEUTROS PCT: 87 % — AB (ref 43–77)
Neutro Abs: 9.1 10*3/uL — ABNORMAL HIGH (ref 1.7–7.7)
PLATELETS: 145 10*3/uL — AB (ref 150–400)
RBC: 4.01 MIL/uL — AB (ref 4.22–5.81)
RDW: 14.2 % (ref 11.5–15.5)
WBC: 10.5 10*3/uL (ref 4.0–10.5)

## 2014-03-06 LAB — URINALYSIS, ROUTINE W REFLEX MICROSCOPIC
Bilirubin Urine: NEGATIVE
GLUCOSE, UA: 100 mg/dL — AB
Ketones, ur: NEGATIVE mg/dL
LEUKOCYTES UA: NEGATIVE
Nitrite: NEGATIVE
PH: 5.5 (ref 5.0–8.0)
Protein, ur: NEGATIVE mg/dL
Specific Gravity, Urine: 1.022 (ref 1.005–1.030)
Urobilinogen, UA: 0.2 mg/dL (ref 0.0–1.0)

## 2014-03-06 LAB — COMPREHENSIVE METABOLIC PANEL
ALBUMIN: 3.5 g/dL (ref 3.5–5.2)
ALK PHOS: 85 U/L (ref 39–117)
ALT: 7 U/L (ref 0–53)
AST: 14 U/L (ref 0–37)
Anion gap: 15 (ref 5–15)
BUN: 21 mg/dL (ref 6–23)
CO2: 20 mEq/L (ref 19–32)
Calcium: 9 mg/dL (ref 8.4–10.5)
Chloride: 106 mEq/L (ref 96–112)
Creatinine, Ser: 1.04 mg/dL (ref 0.50–1.35)
GFR calc Af Amer: 84 mL/min — ABNORMAL LOW (ref >90)
GFR calc non Af Amer: 73 mL/min — ABNORMAL LOW (ref >90)
GLUCOSE: 175 mg/dL — AB (ref 70–99)
POTASSIUM: 3.8 meq/L (ref 3.7–5.3)
Sodium: 141 mEq/L (ref 137–147)
TOTAL PROTEIN: 7.3 g/dL (ref 6.0–8.3)
Total Bilirubin: 0.2 mg/dL — ABNORMAL LOW (ref 0.3–1.2)

## 2014-03-06 LAB — URINE MICROSCOPIC-ADD ON

## 2014-03-06 LAB — TROPONIN I: Troponin I: <0.3 ng/mL (ref <0.30)

## 2014-03-06 LAB — CBG MONITORING, ED: GLUCOSE-CAPILLARY: 146 mg/dL — AB (ref 70–99)

## 2014-03-06 MED ORDER — SODIUM CHLORIDE 0.9 % IV BOLUS (SEPSIS)
1000.0000 mL | Freq: Once | INTRAVENOUS | Status: AC
  Administered 2014-03-06: 1000 mL via INTRAVENOUS

## 2014-03-06 NOTE — Discharge Instructions (Signed)
Return to the ED with any concerns including difficulty breathing, chest pain, fever/chills, vomiting and not able to keep down liquids, decreased level of alertness/lethargy, or any other alarming symptoms

## 2014-03-06 NOTE — ED Notes (Signed)
3 nurses attempted IV start without success  IV team here to attempt IV

## 2014-03-06 NOTE — ED Notes (Signed)
Attempted IV x2. Unable to access.  

## 2014-03-06 NOTE — ED Notes (Addendum)
Pt presents with increased weakness that has been ongoing for the past month.  Pt alert and oriented X 4 at present, grips equal but weak- pt reports that his speech has been delayed for the past 2 months.  Pt reports diarrhea for the past 3 months, denies N/V.

## 2014-03-06 NOTE — ED Notes (Signed)
IV Team at the bedside. 

## 2014-03-06 NOTE — ED Notes (Signed)
Thedore Mins, RN attempted x1. Paged IV Team.

## 2014-03-06 NOTE — ED Notes (Signed)
Pamala Hurry, RN at the bedside attempting to start an IV.

## 2014-03-06 NOTE — ED Notes (Addendum)
Patient arrived in room. NT Debbie at the bedside.

## 2014-03-06 NOTE — ED Provider Notes (Signed)
CSN: [id number]     Arrival date & time 03/06/14  1819 History   First MD Initiated Contact with Patient 03/06/14 1925     Chief Complaint  Patient presents with  . Weakness     (Consider location/radiation/quality/duration/timing/severity/associated sxs/prior Treatment) HPI Pt presenting with c/o generalized weakness.  He states symptoms have been getting worse over the past month.  He states he just feels that all of his muscles are weak.  No muscle pain. No focal weakness.  No chest pain or difficulty breathing.  No fever/chills. He states that he has not been eating and drinking much.  States he has been eating one meal per day due to 'the situation I am in".  States he has access to food and water and does not elaborate further. He denies dizziness upon standing. No changes in vision or speech.  Symptoms are constant.  There are no other associated systemic symptoms, there are no other alleviating or modifying factors.   Past Medical History  Diagnosis Date  . Hepatitis C   . Diabetes mellitus   . Hypertension    History reviewed. No pertinent past surgical history. No family history on file. History  Substance Use Topics  . Smoking status: Never Smoker   . Smokeless tobacco: Not on file  . Alcohol Use: No    Review of Systems ROS reviewed and all otherwise negative except for mentioned in HPI    Allergies  Review of patient's allergies indicates no known allergies.  Home Medications   Prior to Admission medications   Medication Sig Start Date End Date Taking? Authorizing Provider  busPIRone (BUSPAR) 10 MG tablet Take 10 mg by mouth 2 (two) times daily. 03/02/14  Yes Historical Provider, MD  clonazePAM (KLONOPIN) 0.5 MG tablet Take 0.5 mg by mouth 3 (three) times daily as needed for anxiety.   Yes Historical Provider, MD  gabapentin (NEURONTIN) 600 MG tablet Take 600 mg by mouth 2 (two) times daily.    Yes Historical Provider, MD  levETIRAcetam (KEPPRA) 500 MG tablet  Take 500 mg by mouth 2 (two) times daily.   Yes Historical Provider, MD  QUEtiapine (SEROQUEL) 100 MG tablet Take 200 mg by mouth at bedtime. 02/19/14  Yes Historical Provider, MD  risperiDONE (RISPERDAL) 0.5 MG tablet Take 0.5-1 mg by mouth 2 (two) times daily. 0.5 mg in AM, 1 mg in PM   Yes Historical Provider, MD   BP 115/69  Pulse 73  Temp(Src) 98.6 F (37 C) (Oral)  Resp 17  SpO2 98% Vitals reviewed Physical Exam Physical Examination: General appearance - alert, well appearing, and in no distress Mental status - alert, oriented to person, place, and time Eyes - pupils equal and reactive, extraocular eye movements intact Mouth - mucous membranes moist, pharynx normal without lesions Chest - clear to auscultation, no wheezes, rales or rhonchi, symmetric air entry Heart - normal rate, regular rhythm, normal S1, S2, no murmurs, rubs, clicks or gallops Abdomen - soft, nontender, nondistended, no masses or organomegaly, nontender Neurological - alert, oriented, normal speech, strength 5/5 in extremities x4, sensation intact Extremities - peripheral pulses normal, no pedal edema, no clubbing or cyanosis Skin - normal coloration and turgor, no rashes  ED Course  Procedures (including critical care time) Labs Review Labs Reviewed  CBC WITH DIFFERENTIAL - Abnormal; Notable for the following:    RBC 4.01 (*)    Hemoglobin 12.3 (*)    HCT 36.4 (*)    Platelets 145 (*)  Neutrophils Relative % 87 (*)    Neutro Abs 9.1 (*)    Lymphocytes Relative 8 (*)    All other components within normal limits  COMPREHENSIVE METABOLIC PANEL - Abnormal; Notable for the following:    Glucose, Bld 175 (*)    Total Bilirubin 0.2 (*)    GFR calc non Af Amer 73 (*)    GFR calc Af Amer 84 (*)    All other components within normal limits  URINALYSIS, ROUTINE W REFLEX MICROSCOPIC - Abnormal; Notable for the following:    Glucose, UA 100 (*)    Hgb urine dipstick SMALL (*)    All other components within  normal limits  URINE MICROSCOPIC-ADD ON - Abnormal; Notable for the following:    Casts HYALINE CASTS (*)    All other components within normal limits  CBG MONITORING, ED - Abnormal; Notable for the following:    Glucose-Capillary 146 (*)    All other components within normal limits  TROPONIN I    Imaging Review No results found.   EKG Interpretation   Date/Time:  Monday March 06 2014 18:43:49 EDT Ventricular Rate:  108 PR Interval:  170 QRS Duration: 80 QT Interval:  298 QTC Calculation: 399 R Axis:   15 Text Interpretation:  Sinus tachycardia Possible Left atrial enlargement  Low voltage QRS Poor R wave progression Abnormal ECG No significant change  since last tracing Confirmed by [facility]  MD, Chelse Matas [phone]) on 03/06/2014  10:30:44 PM      MDM   Final diagnoses:  Generalized weakness    Pt presenting with c/o generalized weakness.  No focal neurologic symptoms, normal neuro exam. No chest pain.  I feel patient is likely mildly dehydrated.  Labs and urine reassuring.  No source of infection. Recommended increasd po intake.  He has followup scheduled with [name] in 2 days.   Discharged with strict return precautions.  Pt agreeable with plan.   Threasa Beards, MD 03/06/14 [phone]

## 2014-03-06 NOTE — ED Notes (Signed)
Patient discharged.  Voiced understanding to make sure he follows up with his MD on Wednesday as scheduled.

## 2014-06-29 ENCOUNTER — Other Ambulatory Visit: Payer: Self-pay | Admitting: Pain Medicine

## 2014-06-29 DIAGNOSIS — M545 Low back pain, unspecified: Secondary | ICD-10-CM

## 2014-06-30 ENCOUNTER — Ambulatory Visit
Admission: RE | Admit: 2014-06-30 | Discharge: 2014-06-30 | Disposition: A | Discharge status: Home / Self Care | Payer: Medicare Other | Source: Ambulatory Visit | Attending: Pain Medicine | Admitting: Pain Medicine

## 2014-06-30 DIAGNOSIS — M545 Low back pain, unspecified: Secondary | ICD-10-CM

## 2014-08-30 ENCOUNTER — Other Ambulatory Visit (HOSPITAL_COMMUNITY): Payer: Self-pay | Admitting: Cardiology

## 2014-08-30 DIAGNOSIS — M79662 Pain in left lower leg: Secondary | ICD-10-CM

## 2014-08-30 DIAGNOSIS — I2510 Atherosclerotic heart disease of native coronary artery without angina pectoris: Secondary | ICD-10-CM

## 2014-08-30 DIAGNOSIS — M79661 Pain in right lower leg: Secondary | ICD-10-CM

## 2014-08-31 ENCOUNTER — Ambulatory Visit (HOSPITAL_COMMUNITY): Payer: Medicare Other

## 2014-09-05 ENCOUNTER — Ambulatory Visit (HOSPITAL_COMMUNITY)
Admission: RE | Admit: 2014-09-05 | Discharge: 2014-09-05 | Disposition: A | Discharge status: Home / Self Care | Payer: Medicare Other | Source: Ambulatory Visit | Attending: Cardiology | Admitting: Cardiology

## 2014-09-05 ENCOUNTER — Other Ambulatory Visit (HOSPITAL_COMMUNITY): Payer: Self-pay | Admitting: Cardiology

## 2014-09-05 DIAGNOSIS — I2510 Atherosclerotic heart disease of native coronary artery without angina pectoris: Secondary | ICD-10-CM | POA: Diagnosis present

## 2014-09-05 DIAGNOSIS — R0989 Other specified symptoms and signs involving the circulatory and respiratory systems: Secondary | ICD-10-CM

## 2014-09-05 DIAGNOSIS — R609 Edema, unspecified: Secondary | ICD-10-CM | POA: Diagnosis not present

## 2014-09-05 NOTE — Progress Notes (Signed)
VASCULAR LAB PRELIMINARY  ARTERIAL  ABI completed:    RIGHT    LEFT    PRESSURE WAVEFORM  PRESSURE WAVEFORM  BRACHIAL 136 Triphasic BRACHIAL 142 Triphasic  DP 184 Triphasic DP 165 Triphasic  PT 172 Triphasic PT 168 Triphasic    RIGHT LEFT  ABI 1.30 1.18    ABIs and Doppler waveforms are within normal limits.  Danella Philson, RVS 09/05/2014, 1:17 PM

## 2014-10-02 ENCOUNTER — Other Ambulatory Visit (HOSPITAL_COMMUNITY): Payer: Self-pay | Admitting: Urology

## 2014-10-02 DIAGNOSIS — D3500 Benign neoplasm of unspecified adrenal gland: Secondary | ICD-10-CM

## 2014-10-12 ENCOUNTER — Ambulatory Visit (HOSPITAL_COMMUNITY)
Admission: RE | Admit: 2014-10-12 | Discharge: 2014-10-12 | Disposition: A | Discharge status: Home / Self Care | Payer: Medicare Other | Source: Ambulatory Visit | Attending: Urology | Admitting: Urology

## 2014-10-12 DIAGNOSIS — D3500 Benign neoplasm of unspecified adrenal gland: Secondary | ICD-10-CM

## 2014-10-12 DIAGNOSIS — E279 Disorder of adrenal gland, unspecified: Secondary | ICD-10-CM | POA: Insufficient documentation

## 2014-10-12 LAB — POCT I-STAT CREATININE: Creatinine, Ser: 1.1 mg/dL (ref 0.50–1.35)

## 2014-10-12 MED ORDER — GADOBENATE DIMEGLUMINE 529 MG/ML IV SOLN
20.0000 mL | Freq: Once | INTRAVENOUS | Status: AC | PRN
  Administered 2014-10-12: 20 mL via INTRAVENOUS

## 2014-11-02 ENCOUNTER — Emergency Department (HOSPITAL_COMMUNITY)
Admission: EM | Admit: 2014-11-02 | Discharge: 2014-11-02 | Disposition: A | Discharge status: Home / Self Care | Payer: Medicare Other | Attending: Emergency Medicine | Admitting: Emergency Medicine

## 2014-11-02 ENCOUNTER — Encounter (HOSPITAL_COMMUNITY): Payer: Self-pay | Admitting: Physical Medicine and Rehabilitation

## 2014-11-02 ENCOUNTER — Emergency Department (HOSPITAL_COMMUNITY): Payer: Medicare Other

## 2014-11-02 DIAGNOSIS — Y92009 Unspecified place in unspecified non-institutional (private) residence as the place of occurrence of the external cause: Secondary | ICD-10-CM | POA: Diagnosis not present

## 2014-11-02 DIAGNOSIS — S62612A Displaced fracture of proximal phalanx of right middle finger, initial encounter for closed fracture: Secondary | ICD-10-CM | POA: Insufficient documentation

## 2014-11-02 DIAGNOSIS — I10 Essential (primary) hypertension: Secondary | ICD-10-CM | POA: Insufficient documentation

## 2014-11-02 DIAGNOSIS — W01198A Fall on same level from slipping, tripping and stumbling with subsequent striking against other object, initial encounter: Secondary | ICD-10-CM | POA: Diagnosis not present

## 2014-11-02 DIAGNOSIS — S0993XA Unspecified injury of face, initial encounter: Secondary | ICD-10-CM

## 2014-11-02 DIAGNOSIS — Z79899 Other long term (current) drug therapy: Secondary | ICD-10-CM | POA: Insufficient documentation

## 2014-11-02 DIAGNOSIS — S0990XA Unspecified injury of head, initial encounter: Secondary | ICD-10-CM | POA: Diagnosis not present

## 2014-11-02 DIAGNOSIS — S62619A Displaced fracture of proximal phalanx of unspecified finger, initial encounter for closed fracture: Secondary | ICD-10-CM

## 2014-11-02 DIAGNOSIS — Y998 Other external cause status: Secondary | ICD-10-CM | POA: Insufficient documentation

## 2014-11-02 DIAGNOSIS — E119 Type 2 diabetes mellitus without complications: Secondary | ICD-10-CM | POA: Diagnosis not present

## 2014-11-02 DIAGNOSIS — S161XXA Strain of muscle, fascia and tendon at neck level, initial encounter: Secondary | ICD-10-CM | POA: Diagnosis not present

## 2014-11-02 DIAGNOSIS — S6991XA Unspecified injury of right wrist, hand and finger(s), initial encounter: Secondary | ICD-10-CM | POA: Diagnosis present

## 2014-11-02 DIAGNOSIS — Y9389 Activity, other specified: Secondary | ICD-10-CM | POA: Diagnosis not present

## 2014-11-02 DIAGNOSIS — Z8619 Personal history of other infectious and parasitic diseases: Secondary | ICD-10-CM | POA: Insufficient documentation

## 2014-11-02 MED ORDER — OXYCODONE-ACETAMINOPHEN 5-325 MG PO TABS
1.0000 | ORAL_TABLET | Freq: Once | ORAL | Status: AC
  Administered 2014-11-02: 1 via ORAL
  Filled 2014-11-02: qty 1

## 2014-11-02 NOTE — Progress Notes (Signed)
Orthopedic Tech Progress Note Patient Details:  Terry Baxter 07/07/1946 [id number]  Ortho Devices Type of Ortho Device: Finger splint Ortho Device/Splint Location: RUE Ortho Device/Splint Interventions: Ordered, Application   [name] 11/02/2014, 12:16 PM

## 2014-11-02 NOTE — ED Notes (Signed)
Placed pt in C collar since pt complaining of neck pain from fall

## 2014-11-02 NOTE — ED Notes (Signed)
Pt presents to department for evaluation of fall at home last night. States he was taking trash outside when he accidentally fell. Now reports pain to face. Denies LOC. Pt is alert and oriented x4.

## 2014-11-02 NOTE — ED Provider Notes (Signed)
CSN: [id number]     Arrival date & time 11/02/14  2979 History   First MD Initiated Contact with Patient 11/02/14 1004     Chief Complaint  Patient presents with  . Fall    Patient is a 69 y.o. male presenting with fall. The history is provided by the patient.  Fall This is a new problem. The current episode started yesterday. The problem occurs rarely. The problem has been gradually improving. Associated symptoms include headaches. Pertinent negatives include no chest pain, no abdominal pain and no shortness of breath. Nothing aggravates the symptoms. Nothing relieves the symptoms.  Patient reports he was taking trash out last night when he accidentally fell down a hill He reports hit his face during fall No LOC but he reports continued HA No vomiting He reports neck pain He also reports right hand pain No abd pain No CP   Past Medical History  Diagnosis Date  . Hepatitis C   . Diabetes mellitus   . Hypertension    History reviewed. No pertinent past surgical history. History reviewed. No pertinent family history. History  Substance Use Topics  . Smoking status: Never Smoker   . Smokeless tobacco: Not on file  . Alcohol Use: No    Review of Systems  Constitutional: Negative for fever.  HENT: Negative for nosebleeds.   Eyes: Negative for visual disturbance.  Respiratory: Negative for shortness of breath.   Cardiovascular: Negative for chest pain.  Gastrointestinal: Negative for abdominal pain.  Musculoskeletal: Positive for arthralgias and neck pain. Negative for back pain.  Neurological: Positive for headaches.  All other systems reviewed and are negative.     Allergies  Review of patient's allergies indicates no known allergies.  Home Medications   Prior to Admission medications   Medication Sig Start Date End Date Taking? Authorizing Provider  busPIRone (BUSPAR) 10 MG tablet Take 10 mg by mouth 2 (two) times daily. 03/02/14   Historical Provider, MD   clonazePAM (KLONOPIN) 0.5 MG tablet Take 0.5 mg by mouth 3 (three) times daily as needed for anxiety.    Historical Provider, MD  gabapentin (NEURONTIN) 600 MG tablet Take 600 mg by mouth 2 (two) times daily.     Historical Provider, MD  levETIRAcetam (KEPPRA) 500 MG tablet Take 500 mg by mouth 2 (two) times daily.    Historical Provider, MD  QUEtiapine (SEROQUEL) 100 MG tablet Take 200 mg by mouth at bedtime. 02/19/14   Historical Provider, MD  risperiDONE (RISPERDAL) 0.5 MG tablet Take 0.5-1 mg by mouth 2 (two) times daily. 0.5 mg in AM, 1 mg in PM    Historical Provider, MD   BP 130/76 mmHg  Pulse 67  Temp(Src) 98.8 F (37.1 C) (Oral)  Resp 18  SpO2 96% Physical Exam CONSTITUTIONAL: Well developed/well nourished HEAD: Normocephalic/atraumatic EYES: EOMI/PERRL ENMT: Mucous membranes moist.  Tenderness to left side of face.  No deformity or stepoffs.  No septal hematoma.  No dental injury. NECK: c-collar in place SPINE/BACK:tenderness to cspine palpation/movement of neck.  No thoracic/lumbar tenderness No bruising/crepitance/stepoffs noted to spine CV: S1/S2 noted, no murmurs/rubs/gallops noted LUNGS: Lungs are clear to auscultation bilaterally, no apparent distress Chest - no tenderness to palpation ABDOMEN: soft, nontender, no rebound or guarding, bowel sounds noted throughout abdomen GU:no cva tenderness NEURO: Pt is awake/alert/appropriate, moves all extremitiesx4.  No facial droop.   EXTREMITIES: pulses normal/equal, full ROM.  Tenderness to dorsal aspect of right hand.  All other extremities/joints palpated/ranged and nontender SKIN: warm, color  normal PSYCH: flat affect  ED Course  Procedures   SPLINT APPLICATION Date/Time: 36/64/40 Authorized by: Sharyon Cable Consent: Verbal consent obtained. Risks and benefits: risks, benefits and alternatives were discussed Consent given by: patient Splint applied by: orthopedic technician Location details: right middle  finger Splint type: finger splint Supplies used: finger splint Post-procedure: The splinted body part was neurovascularly unchanged following the procedure. Patient tolerance: Patient tolerated the procedure well with no immediate complications.    11:14 AM Pt with mechanical fall here for persistent HA/facial pain and right hand pain and cspine tenderness Given age/history will obtain imaging of CT head/face/cspine and right hand  12:44 PM Pt improved Finger splint applied Pt appropriate for d/c home  Imaging Review Ct Head Wo Contrast  11/02/2014   CLINICAL DATA:  [place] at home last night when taking trash outside, facial pain, hypertension, diabetes  EXAM: CT HEAD WITHOUT CONTRAST  CT MAXILLOFACIAL WITHOUT CONTRAST  CT CERVICAL SPINE WITHOUT CONTRAST  TECHNIQUE: Multidetector CT imaging of the head, cervical spine, and maxillofacial structures were performed using the standard protocol without intravenous contrast. Multiplanar CT image reconstructions of the cervical spine and maxillofacial structures were also generated. RIGHT-side of face marked with a BB.  COMPARISON:  CT head 06/03/2010  FINDINGS: CT HEAD FINDINGS  Normal ventricular morphology.  No midline shift or mass effect.  Minimal small vessel chronic ischemic changes of deep cerebral white matter similar to previous exam.  No intracranial hemorrhage, mass lesion, or evidence of acute infarction.  No extra-axial fluid collections.  Calvaria intact.  CT MAXILLOFACIAL FINDINGS  Visualized intracranial structures and intra orbital soft tissue planes clear.  Few beam hardening artifacts of dental origin.  Soft tissues otherwise unremarkable.  Visualized paranasal sinuses, mastoid air cells, and middle ear cavities clear.  No facial bone fractures identified.  CT CERVICAL SPINE FINDINGS  Prevertebral soft tissues normal thickness.  Disc space narrowing with bulky endplate spur formation C4-C5 through C6-C7.  Vertebral body heights  maintained without fracture or subluxation.  Scattered multilevel facet degenerative changes.  Schmorl's node superior endplate C7.  Non fused ossicles at adjacent to tip of odontoid process.  Visualized skullbase intact.  Osseous mineralization normal.  IMPRESSION: No acute intracranial abnormalities.  Minimal small vessel chronic ischemic changes of deep cerebral white matter.  No acute facial bone abnormalities.  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  No acute cervical spine abnormalities.   Electronically Signed   By: Lavonia Dana M.D.   On: 11/02/2014 11:44   Ct Cervical Spine Wo Contrast  11/02/2014   CLINICAL DATA:  [place] at home last night when taking trash outside, facial pain, hypertension, diabetes  EXAM: CT HEAD WITHOUT CONTRAST  CT MAXILLOFACIAL WITHOUT CONTRAST  CT CERVICAL SPINE WITHOUT CONTRAST  TECHNIQUE: Multidetector CT imaging of the head, cervical spine, and maxillofacial structures were performed using the standard protocol without intravenous contrast. Multiplanar CT image reconstructions of the cervical spine and maxillofacial structures were also generated. RIGHT-side of face marked with a BB.  COMPARISON:  CT head 06/03/2010  FINDINGS: CT HEAD FINDINGS  Normal ventricular morphology.  No midline shift or mass effect.  Minimal small vessel chronic ischemic changes of deep cerebral white matter similar to previous exam.  No intracranial hemorrhage, mass lesion, or evidence of acute infarction.  No extra-axial fluid collections.  Calvaria intact.  CT MAXILLOFACIAL FINDINGS  Visualized intracranial structures and intra orbital soft tissue planes clear.  Few beam hardening artifacts of dental  origin.  Soft tissues otherwise unremarkable.  Visualized paranasal sinuses, mastoid air cells, and middle ear cavities clear.  No facial bone fractures identified.  CT CERVICAL SPINE FINDINGS  Prevertebral soft tissues normal thickness.  Disc space narrowing with  bulky endplate spur formation C4-C5 through C6-C7.  Vertebral body heights maintained without fracture or subluxation.  Scattered multilevel facet degenerative changes.  Schmorl's node superior endplate C7.  Non fused ossicles at adjacent to tip of odontoid process.  Visualized skullbase intact.  Osseous mineralization normal.  IMPRESSION: No acute intracranial abnormalities.  Minimal small vessel chronic ischemic changes of deep cerebral white matter.  No acute facial bone abnormalities.  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  No acute cervical spine abnormalities.   Electronically Signed   By: Lavonia Dana M.D.   On: 11/02/2014 11:44   Dg Hand Complete Right  11/02/2014   CLINICAL DATA:  [place] down [place] in front of his house, right posterior hand pain  EXAM: RIGHT HAND - COMPLETE 3+ VIEW  COMPARISON:  None.  FINDINGS: Three views of the right hand submitted. Degenerative changes are noted first carpometacarpal and metacarpophalangeal joint. Mild degenerative changes distal interphalangeal joints. Mild narrowing of radiocarpal joint space. There is small avulsion fracture at the base of proximal phalanx third finger.  IMPRESSION: Small avulsion fracture at the base of proximal phalanx third finger. Degenerative changes as described above.   Electronically Signed   By: Lahoma Crocker M.D.   On: 11/02/2014 11:12   Ct Maxillofacial Wo Cm  11/02/2014   CLINICAL DATA:  [place] at home last night when taking trash outside, facial pain, hypertension, diabetes  EXAM: CT HEAD WITHOUT CONTRAST  CT MAXILLOFACIAL WITHOUT CONTRAST  CT CERVICAL SPINE WITHOUT CONTRAST  TECHNIQUE: Multidetector CT imaging of the head, cervical spine, and maxillofacial structures were performed using the standard protocol without intravenous contrast. Multiplanar CT image reconstructions of the cervical spine and maxillofacial structures were also generated. RIGHT-side of face marked with a BB.  COMPARISON:  CT  head 06/03/2010  FINDINGS: CT HEAD FINDINGS  Normal ventricular morphology.  No midline shift or mass effect.  Minimal small vessel chronic ischemic changes of deep cerebral white matter similar to previous exam.  No intracranial hemorrhage, mass lesion, or evidence of acute infarction.  No extra-axial fluid collections.  Calvaria intact.  CT MAXILLOFACIAL FINDINGS  Visualized intracranial structures and intra orbital soft tissue planes clear.  Few beam hardening artifacts of dental origin.  Soft tissues otherwise unremarkable.  Visualized paranasal sinuses, mastoid air cells, and middle ear cavities clear.  No facial bone fractures identified.  CT CERVICAL SPINE FINDINGS  Prevertebral soft tissues normal thickness.  Disc space narrowing with bulky endplate spur formation C4-C5 through C6-C7.  Vertebral body heights maintained without fracture or subluxation.  Scattered multilevel facet degenerative changes.  Schmorl's node superior endplate C7.  Non fused ossicles at adjacent to tip of odontoid process.  Visualized skullbase intact.  Osseous mineralization normal.  IMPRESSION: No acute intracranial abnormalities.  Minimal small vessel chronic ischemic changes of deep cerebral white matter.  No acute facial bone abnormalities.  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  No acute cervical spine abnormalities.   Electronically Signed   By: Lavonia Dana M.D.   On: 11/02/2014 11:44    Medications  oxyCODONE-acetaminophen (PERCOCET/ROXICET) 5-325 MG per tablet 1 tablet (1 tablet Oral Given 11/02/14 1021)    MDM   Final diagnoses:  Avulsion  fracture of proximal phalanx of finger, closed, initial encounter  Minor head injury, initial encounter  Cervical strain, acute, initial encounter  Facial trauma, initial encounter    Nursing notes including past medical history and social history reviewed and considered in documentation xrays/imaging reviewed by myself and considered  during evaluation     Ripley Fraise, MD 11/02/14 1245

## 2014-11-02 NOTE — Discharge Instructions (Signed)

## 2014-11-06 ENCOUNTER — Emergency Department (HOSPITAL_COMMUNITY): Payer: Medicare Other

## 2014-11-06 ENCOUNTER — Inpatient Hospital Stay (HOSPITAL_COMMUNITY)
Admission: EM | Admit: 2014-11-06 | Discharge: 2014-11-09 | DRG: 069 | Disposition: A | Discharge status: Skilled Nursing Facility | Payer: Medicare Other | Attending: Cardiology | Admitting: Cardiology

## 2014-11-06 ENCOUNTER — Encounter (HOSPITAL_COMMUNITY): Payer: Self-pay

## 2014-11-06 DIAGNOSIS — E785 Hyperlipidemia, unspecified: Secondary | ICD-10-CM | POA: Diagnosis present

## 2014-11-06 DIAGNOSIS — Z6841 Body Mass Index (BMI) 40.0 and over, adult: Secondary | ICD-10-CM

## 2014-11-06 DIAGNOSIS — B1920 Unspecified viral hepatitis C without hepatic coma: Secondary | ICD-10-CM | POA: Diagnosis present

## 2014-11-06 DIAGNOSIS — I10 Essential (primary) hypertension: Secondary | ICD-10-CM | POA: Diagnosis present

## 2014-11-06 DIAGNOSIS — Z01818 Encounter for other preprocedural examination: Secondary | ICD-10-CM

## 2014-11-06 DIAGNOSIS — G451 Carotid artery syndrome (hemispheric): Secondary | ICD-10-CM | POA: Diagnosis not present

## 2014-11-06 DIAGNOSIS — E1159 Type 2 diabetes mellitus with other circulatory complications: Secondary | ICD-10-CM | POA: Diagnosis not present

## 2014-11-06 DIAGNOSIS — G40909 Epilepsy, unspecified, not intractable, without status epilepticus: Secondary | ICD-10-CM | POA: Diagnosis present

## 2014-11-06 DIAGNOSIS — I639 Cerebral infarction, unspecified: Secondary | ICD-10-CM | POA: Insufficient documentation

## 2014-11-06 DIAGNOSIS — E119 Type 2 diabetes mellitus without complications: Secondary | ICD-10-CM | POA: Diagnosis present

## 2014-11-06 DIAGNOSIS — G459 Transient cerebral ischemic attack, unspecified: Secondary | ICD-10-CM | POA: Diagnosis present

## 2014-11-06 DIAGNOSIS — F419 Anxiety disorder, unspecified: Secondary | ICD-10-CM | POA: Diagnosis present

## 2014-11-06 DIAGNOSIS — R29810 Facial weakness: Secondary | ICD-10-CM | POA: Diagnosis not present

## 2014-11-06 HISTORY — PX: TRANSTHORACIC ECHOCARDIOGRAM: SHX275

## 2014-11-06 LAB — COMPREHENSIVE METABOLIC PANEL
ALK PHOS: 73 U/L (ref 39–117)
ALT: 10 U/L (ref 0–53)
AST: 16 U/L (ref 0–37)
Albumin: 3.5 g/dL (ref 3.5–5.2)
Anion gap: 9 (ref 5–15)
BILIRUBIN TOTAL: 0.5 mg/dL (ref 0.3–1.2)
BUN: 15 mg/dL (ref 6–23)
CALCIUM: 8.9 mg/dL (ref 8.4–10.5)
CO2: 24 mmol/L (ref 19–32)
CREATININE: 1.19 mg/dL (ref 0.50–1.35)
Chloride: 106 mmol/L (ref 96–112)
GFR, EST AFRICAN AMERICAN: 71 mL/min — AB (ref >90)
GFR, EST NON AFRICAN AMERICAN: 61 mL/min — AB (ref >90)
GLUCOSE: 108 mg/dL — AB (ref 70–99)
Potassium: 3.9 mmol/L (ref 3.5–5.1)
Sodium: 139 mmol/L (ref 135–145)
Total Protein: 7 g/dL (ref 6.0–8.3)

## 2014-11-06 LAB — I-STAT CHEM 8, ED
BUN: 22 mg/dL (ref 6–23)
CREATININE: 1 mg/dL (ref 0.50–1.35)
Calcium, Ion: 1.19 mmol/L (ref 1.13–1.30)
Chloride: 106 mmol/L (ref 96–112)
GLUCOSE: 110 mg/dL — AB (ref 70–99)
HCT: 45 % (ref 39.0–52.0)
Hemoglobin: 15.3 g/dL (ref 13.0–17.0)
Potassium: 4.3 mmol/L (ref 3.5–5.1)
Sodium: 142 mmol/L (ref 135–145)
TCO2: 22 mmol/L (ref 0–100)

## 2014-11-06 LAB — CBC
HCT: 40.4 % (ref 39.0–52.0)
HEMOGLOBIN: 13.3 g/dL (ref 13.0–17.0)
MCH: 31 pg (ref 26.0–34.0)
MCHC: 32.9 g/dL (ref 30.0–36.0)
MCV: 94.2 fL (ref 78.0–100.0)
Platelets: 146 10*3/uL — ABNORMAL LOW (ref 150–400)
RBC: 4.29 MIL/uL (ref 4.22–5.81)
RDW: 14.5 % (ref 11.5–15.5)
WBC: 10.8 10*3/uL — ABNORMAL HIGH (ref 4.0–10.5)

## 2014-11-06 LAB — GLUCOSE, CAPILLARY: GLUCOSE-CAPILLARY: 96 mg/dL (ref 70–99)

## 2014-11-06 LAB — DIFFERENTIAL
Basophils Absolute: 0 10*3/uL (ref 0.0–0.1)
Basophils Relative: 0 % (ref 0–1)
EOS ABS: 0.1 10*3/uL (ref 0.0–0.7)
EOS PCT: 1 % (ref 0–5)
Lymphocytes Relative: 11 % — ABNORMAL LOW (ref 12–46)
Lymphs Abs: 1.2 10*3/uL (ref 0.7–4.0)
Monocytes Absolute: 0.8 10*3/uL (ref 0.1–1.0)
Monocytes Relative: 7 % (ref 3–12)
Neutro Abs: 8.7 10*3/uL — ABNORMAL HIGH (ref 1.7–7.7)
Neutrophils Relative %: 81 % — ABNORMAL HIGH (ref 43–77)

## 2014-11-06 LAB — I-STAT TROPONIN, ED: Troponin i, poc: 0 ng/mL (ref 0.00–0.08)

## 2014-11-06 LAB — ETHANOL: Alcohol, Ethyl (B): <5 mg/dL (ref 0–9)

## 2014-11-06 LAB — APTT: aPTT: 30 seconds (ref 24–37)

## 2014-11-06 LAB — CBG MONITORING, ED: Glucose-Capillary: 109 mg/dL — ABNORMAL HIGH (ref 70–99)

## 2014-11-06 LAB — PROTIME-INR
INR: 1.05 (ref 0.00–1.49)
PROTHROMBIN TIME: 13.8 s (ref 11.6–15.2)

## 2014-11-06 MED ORDER — SENNOSIDES-DOCUSATE SODIUM 8.6-50 MG PO TABS
1.0000 | ORAL_TABLET | Freq: Every evening | ORAL | Status: DC | PRN
  Administered 2014-11-07 – 2014-11-09 (×2): 1 via ORAL
  Filled 2014-11-06 (×2): qty 1

## 2014-11-06 MED ORDER — INSULIN ASPART 100 UNIT/ML ~~LOC~~ SOLN: 0.0000 [IU] | Freq: Three times a day (TID) | SUBCUTANEOUS | Status: DC

## 2014-11-06 MED ORDER — ATORVASTATIN CALCIUM 40 MG PO TABS
40.0000 mg | ORAL_TABLET | Freq: Every day | ORAL | Status: DC
  Administered 2014-11-07: 40 mg via ORAL
  Filled 2014-11-06 (×2): qty 1

## 2014-11-06 MED ORDER — ASPIRIN EC 81 MG PO TBEC
81.0000 mg | DELAYED_RELEASE_TABLET | Freq: Every day | ORAL | Status: DC
  Administered 2014-11-06: 81 mg via ORAL
  Filled 2014-11-06: qty 1

## 2014-11-06 MED ORDER — INSULIN ASPART 100 UNIT/ML ~~LOC~~ SOLN: 0.0000 [IU] | Freq: Every day | SUBCUTANEOUS | Status: DC

## 2014-11-06 MED ORDER — ASPIRIN 325 MG PO TABS
325.0000 mg | ORAL_TABLET | Freq: Every day | ORAL | Status: DC
  Administered 2014-11-07 – 2014-11-09 (×3): 325 mg via ORAL
  Filled 2014-11-06 (×3): qty 1

## 2014-11-06 MED ORDER — HEPARIN SODIUM (PORCINE) 5000 UNIT/ML IJ SOLN
5000.0000 [IU] | Freq: Three times a day (TID) | INTRAMUSCULAR | Status: DC
  Administered 2014-11-07 – 2014-11-09 (×9): 5000 [IU] via SUBCUTANEOUS
  Filled 2014-11-06 (×8): qty 1

## 2014-11-06 MED ORDER — SODIUM CHLORIDE 0.9 % IV SOLN
INTRAVENOUS | Status: DC
  Administered 2014-11-07 (×2): via INTRAVENOUS

## 2014-11-06 MED ORDER — GABAPENTIN 300 MG PO CAPS
300.0000 mg | ORAL_CAPSULE | Freq: Two times a day (BID) | ORAL | Status: DC
  Administered 2014-11-07 – 2014-11-09 (×6): 300 mg via ORAL
  Filled 2014-11-06 (×6): qty 1

## 2014-11-06 MED ORDER — LEVETIRACETAM 500 MG PO TABS
500.0000 mg | ORAL_TABLET | Freq: Two times a day (BID) | ORAL | Status: DC
  Administered 2014-11-07 – 2014-11-09 (×6): 500 mg via ORAL
  Filled 2014-11-06 (×6): qty 1

## 2014-11-06 MED ORDER — STROKE: EARLY STAGES OF RECOVERY BOOK
Freq: Once | Status: AC
  Administered 2014-11-07: 01:00:00

## 2014-11-06 MED ORDER — ASPIRIN 300 MG RE SUPP: 300.0000 mg | Freq: Every day | RECTAL | Status: DC

## 2014-11-06 NOTE — ED Notes (Signed)
Pt called out stated that he messed the bed.

## 2014-11-06 NOTE — H&P (Signed)
Terry Baxter is an 69 y.o. male.   Chief Complaint: Left facial weakness/dysarthria/unsteady gait HPI: Patient is 69 year old male with past medical history significant for hypertension, diabetes mellitus, history of seizure disorder, history of hepatitis C, came to the ER by EMS escorts stroke was called because of left facial weakness/speech and dysarthria. Patient denies any weakness in the arms or legs. But states had unsteady gait and felt generalized weakness. Patient had CT of the brain and MRI of the brain which showed small vessel disease no acute ischemic stroke was noted. Patient presently denies any complaints. Patient was seen by neuro and was felt not distended for TPA as he was out of the window time period.  Past Medical History  Diagnosis Date  . Hepatitis C   . Diabetes mellitus   . Hypertension     History reviewed. No pertinent past surgical history.  No family history on file. Social History:  reports that he has never smoked. He does not have any smokeless tobacco history on file. He reports that he does not drink alcohol or use illicit drugs.  Allergies: No Known Allergies   (Not in a hospital admission)  Results for orders placed or performed during the hospital encounter of 11/06/14 (from the past 48 hour(s))  I-Stat Chem 8, ED     Status: Abnormal   Collection Time: 11/06/14  5:13 PM  Result Value Ref Range   Sodium 142 135 - 145 mmol/L   Potassium 4.3 3.5 - 5.1 mmol/L   Chloride 106 96 - 112 mmol/L   BUN 22 6 - 23 mg/dL   Creatinine, Ser 1.00 0.50 - 1.35 mg/dL   Glucose, Bld 110 (H) 70 - 99 mg/dL   Calcium, Ion 1.19 1.13 - 1.30 mmol/L   TCO2 22 0 - 100 mmol/L   Hemoglobin 15.3 13.0 - 17.0 g/dL   HCT 45.0 39.0 - 52.0 %  I-Stat Troponin, ED (not at [facility])     Status: None   Collection Time: 11/06/14  5:14 PM  Result Value Ref Range   Troponin i, poc 0.00 0.00 - 0.08 ng/mL   Comment 3            Comment: Due to the release kinetics of cTnI, a negative  result within the first hours of the onset of symptoms does not rule out myocardial infarction with certainty. If myocardial infarction is still suspected, repeat the test at appropriate intervals.   CBG monitoring, ED     Status: Abnormal   Collection Time: 11/06/14  5:14 PM  Result Value Ref Range   Glucose-Capillary 109 (H) 70 - 99 mg/dL  Protime-INR     Status: None   Collection Time: 11/06/14  5:40 PM  Result Value Ref Range   Prothrombin Time 13.8 11.6 - 15.2 seconds   INR 1.05 0.00 - 1.49  APTT     Status: None   Collection Time: 11/06/14  5:40 PM  Result Value Ref Range   aPTT 30 24 - 37 seconds  CBC     Status: Abnormal   Collection Time: 11/06/14  5:40 PM  Result Value Ref Range   WBC 10.8 (H) 4.0 - 10.5 K/uL   RBC 4.29 4.22 - 5.81 MIL/uL   Hemoglobin 13.3 13.0 - 17.0 g/dL   HCT 40.4 39.0 - 52.0 %   MCV 94.2 78.0 - 100.0 fL   MCH 31.0 26.0 - 34.0 pg   MCHC 32.9 30.0 - 36.0 g/dL   RDW 14.5 11.5 -  15.5 %   Platelets 146 (L) 150 - 400 K/uL  Differential     Status: Abnormal   Collection Time: 11/06/14  5:40 PM  Result Value Ref Range   Neutrophils Relative % 81 (H) 43 - 77 %   Neutro Abs 8.7 (H) 1.7 - 7.7 K/uL   Lymphocytes Relative 11 (L) 12 - 46 %   Lymphs Abs 1.2 0.7 - 4.0 K/uL   Monocytes Relative 7 3 - 12 %   Monocytes Absolute 0.8 0.1 - 1.0 K/uL   Eosinophils Relative 1 0 - 5 %   Eosinophils Absolute 0.1 0.0 - 0.7 K/uL   Basophils Relative 0 0 - 1 %   Basophils Absolute 0.0 0.0 - 0.1 K/uL  Comprehensive metabolic panel     Status: Abnormal   Collection Time: 11/06/14  5:40 PM  Result Value Ref Range   Sodium 139 135 - 145 mmol/L   Potassium 3.9 3.5 - 5.1 mmol/L   Chloride 106 96 - 112 mmol/L   CO2 24 19 - 32 mmol/L   Glucose, Bld 108 (H) 70 - 99 mg/dL   BUN 15 6 - 23 mg/dL   Creatinine, Ser 1.19 0.50 - 1.35 mg/dL   Calcium 8.9 8.4 - 10.5 mg/dL   Total Protein 7.0 6.0 - 8.3 g/dL   Albumin 3.5 3.5 - 5.2 g/dL   AST 16 0 - 37 U/L   ALT 10 0 - 53 U/L    Alkaline Phosphatase 73 39 - 117 U/L   Total Bilirubin 0.5 0.3 - 1.2 mg/dL   GFR calc non Af Amer 61 (L) >90 mL/min   GFR calc Af Amer 71 (L) >90 mL/min    Comment: (NOTE) The eGFR has been calculated using the CKD EPI equation. This calculation has not been validated in all clinical situations. eGFR's persistently <90 mL/min signify possible Chronic Kidney Disease.    Anion gap 9 5 - 15  Ethanol     Status: None   Collection Time: 11/06/14  5:45 PM  Result Value Ref Range   Alcohol, Ethyl (B) <5 0 - 9 mg/dL    Comment:        LOWEST DETECTABLE LIMIT FOR SERUM ALCOHOL IS 11 mg/dL FOR MEDICAL PURPOSES ONLY    Ct Head Wo Contrast  11/06/2014   CLINICAL DATA:  Code stroke.  Left-sided weakness.  Aphasia.  EXAM: CT HEAD WITHOUT CONTRAST  TECHNIQUE: Contiguous axial images were obtained from the base of the skull through the vertex without intravenous contrast.  COMPARISON:  Head CT 11/02/2014  FINDINGS: No intracranial hemorrhage, mass effect, or midline shift. No evidence of territorial infarct. Stable chronic small vessel ischemia. No hydrocephalus. The basilar cisterns are patent. No intracranial fluid collection. Calvarium is intact. Included paranasal sinuses and mastoid air cells are well aerated.  IMPRESSION: No CT findings of acute ischemia, or significant change from prior exam.  These results were called by telephone at the time of interpretation on 11/06/2014 at 5:30 pm to Dr. Aram Beecham , who verbally acknowledged these results.   Electronically Signed   By: Jeb Levering M.D.   On: 11/06/2014 17:31   Mr Jodene Nam Head Wo Contrast  11/06/2014   CLINICAL DATA:  Aphasia.  Left facial droop.  Stroke.  EXAM: MRI HEAD WITHOUT CONTRAST  MRA HEAD WITHOUT CONTRAST  TECHNIQUE: Multiplanar, multiecho pulse sequences of the brain and surrounding structures were obtained without intravenous contrast. Angiographic images of the head were obtained using MRA technique without contrast.  COMPARISON:  CT  head 11/06/2014  FINDINGS: MRI HEAD FINDINGS  Negative for acute infarct  Mild atrophy.  Negative for hydrocephalus.  Mild chronic microvascular ischemic change in the white matter. Brainstem and cerebellum intact.  Pituitary normal in size. Craniocervical junction normal. Paranasal sinuses clear.  Negative for intracranial hemorrhage.  Negative for mass or edema.  No shift of the midline structures.  MRA HEAD FINDINGS  Image quality degraded by moderate motion.  This limits the exam.  Left vertebral artery patent to the basilar. Right vertebral artery not visualized and may be occluded in the neck. Basilar artery is patent. Fetal origin of the posterior cerebral artery bilaterally with hypoplastic distal basilar.  Internal carotid artery patent bilaterally. Anterior and middle cerebral arteries are degraded by significant motion but appear patent.  This study is not of adequate quality to exclude cerebral aneurysm.  IMPRESSION: Mild chronic microvascular ischemia.  No acute infarct  Limited MRA of the brain due to motion.  No large vessel occlusion.   Electronically Signed   By: Franchot Gallo M.D.   On: 11/06/2014 19:44   Mr Brain Wo Contrast  11/06/2014   CLINICAL DATA:  Aphasia.  Left facial droop.  Stroke.  EXAM: MRI HEAD WITHOUT CONTRAST  MRA HEAD WITHOUT CONTRAST  TECHNIQUE: Multiplanar, multiecho pulse sequences of the brain and surrounding structures were obtained without intravenous contrast. Angiographic images of the head were obtained using MRA technique without contrast.  COMPARISON:  CT head 11/06/2014  FINDINGS: MRI HEAD FINDINGS  Negative for acute infarct  Mild atrophy.  Negative for hydrocephalus.  Mild chronic microvascular ischemic change in the white matter. Brainstem and cerebellum intact.  Pituitary normal in size. Craniocervical junction normal. Paranasal sinuses clear.  Negative for intracranial hemorrhage.  Negative for mass or edema.  No shift of the midline structures.  MRA HEAD  FINDINGS  Image quality degraded by moderate motion.  This limits the exam.  Left vertebral artery patent to the basilar. Right vertebral artery not visualized and may be occluded in the neck. Basilar artery is patent. Fetal origin of the posterior cerebral artery bilaterally with hypoplastic distal basilar.  Internal carotid artery patent bilaterally. Anterior and middle cerebral arteries are degraded by significant motion but appear patent.  This study is not of adequate quality to exclude cerebral aneurysm.  IMPRESSION: Mild chronic microvascular ischemia.  No acute infarct  Limited MRA of the brain due to motion.  No large vessel occlusion.   Electronically Signed   By: Franchot Gallo M.D.   On: 11/06/2014 19:44    Review of Systems  Constitutional: Positive for malaise/fatigue. Negative for fever and chills.  Eyes: Negative for double vision and photophobia.  Respiratory: Negative for cough, hemoptysis and sputum production.   Cardiovascular: Negative for chest pain, palpitations and orthopnea.  Gastrointestinal: Positive for nausea. Negative for vomiting, abdominal pain and diarrhea.  Genitourinary: Negative for dysuria.  Neurological: Positive for dizziness, speech change and weakness. Negative for focal weakness, seizures and headaches.    Blood pressure 124/79, pulse 89, temperature 97.3 F (36.3 C), temperature source Oral, resp. rate 20, SpO2 100 %. Physical Exam  Constitutional: He is oriented to person, place, and time.  HENT:  Head: Normocephalic and atraumatic.  Eyes: Conjunctivae are normal. Pupils are equal, round, and reactive to light. Left eye exhibits no discharge. No scleral icterus.  Neck: Normal range of motion. Neck supple. No JVD present. No tracheal deviation present. No thyromegaly present.  Cardiovascular: Normal rate and  regular rhythm.  Exam reveals no gallop.   Murmur (Soft systolic murmur noted no S3 gallop) heard. Respiratory: Effort normal. No respiratory  distress. He has no wheezes. He has no rales.  GI: Soft. Bowel sounds are normal. He exhibits no distension. There is no tenderness. There is no rebound.  Musculoskeletal: He exhibits no edema or tenderness.  Neurological: He is alert and oriented to person, place, and time. No cranial nerve deficit.     Assessment/Plan Status post TIA A rule out CVA Hypertension Diabetes mellitus History of seizure disorder History of hepatitis C Plan As per orders  Wrenn Willcox N 11/06/2014, 10:08 PM

## 2014-11-06 NOTE — Consult Note (Signed)
Referring Physician: ED    Chief Complaint: code stroke, left face weakness, dysarthria, unsteadiness  HPI:                                                                                                                                         Arrow Tomko is an 69 y.o. male with a past medical history significant for HTN, DM, hepatitis C, and seizures, brought in via EMS as a code stroke due to acute onset of the above stated symptoms. As per EMS, patient was visiting some friends in her neighborhood and was noted to have left face weakness and slurred speech. EMS was summoned and he was unsteady on his feet. Patient denies similar symptoms before but was seen in the ED few days ago after sustaining a fall. Denies associated HA, vertigo, double vision, focal weakness, or visual disturbances. NIHSS 4 CT brain was personally reviewed and showed no acute abnormality.  Date last known well: 11/06/14 Time last known well: unknown.  tPA Given: no, out of the window NIHSS: 4   Past Medical History  Diagnosis Date  . Hepatitis C   . Diabetes mellitus   . Hypertension     History reviewed. No pertinent past surgical history.  No family history on file. Social History:  reports that he has never smoked. He does not have any smokeless tobacco history on file. He reports that he does not drink alcohol or use illicit drugs.  Family history: no brain tumors, brain aneurysms, or epilepsy  Allergies: No Known Allergies  Medications:                                                                                                                           I have reviewed the patient's current medications.  ROS:  History obtained from the patient and chart review  General ROS: negative for - chills, fatigue, fever, night sweats, or weight loss Psychological ROS:  negative for - behavioral disorder, hallucinations, memory difficulties, mood swings or suicidal ideation Ophthalmic ROS: negative for - blurry vision, double vision, eye pain or loss of vision ENT ROS: negative for - epistaxis, nasal discharge, oral lesions, sore throat, tinnitus or vertigo Allergy and Immunology ROS: negative for - hives or itchy/watery eyes Hematological and Lymphatic ROS: negative for - bleeding problems, bruising or swollen lymph nodes Endocrine ROS: negative for - galactorrhea, hair pattern changes, polydipsia/polyuria or temperature intolerance Respiratory ROS: negative for - cough, hemoptysis, shortness of breath or wheezing Cardiovascular ROS: negative for - chest pain, dyspnea on exertion, edema or irregular heartbeat Gastrointestinal ROS: negative for - abdominal pain, diarrhea, hematemesis, nausea/vomiting or stool incontinence Genito-Urinary ROS: negative for - dysuria, hematuria, incontinence or urinary frequency/urgency Musculoskeletal ROS: negative for - joint swelling or muscular weakness Neurological ROS: as noted in HPI Dermatological ROS: negative for rash and skin lesion changes   Physical exam: pleasant male in no apparent distress. Blood pressure 127/81, pulse 68, temperature 97.3 F (36.3 C), temperature source Oral, resp. rate 18, SpO2 94 %. Head: normocephalic. Neck: supple, no bruits, no JVD. Cardiac: no murmurs. Lungs: clear. Abdomen: soft, no tender, no mass. Extremities: no edema. Skin: no rash Neurologic Examination:                                                                                                      General: Mental Status: Alert, oriented, thought content appropriate.  Speech dysarthric without evidence of aphasia.  Able to follow 3 step commands without difficulty. Cranial Nerves: II: Discs flat bilaterally; Visual fields grossly normal, pupils equal, round, reactive to light and accommodation III,IV, VI: ptosis not  present, extra-ocular motions intact bilaterally V,VII: smile asymmetric due to mild left face weakness, facial light touch sensation normal bilaterally VIII: hearing normal bilaterally IX,X: uvula rises symmetrically XI: bilateral shoulder shrug XII: midline tongue extension without atrophy or fasciculations Motor: Right : Upper extremity   5/5    Left:     Upper extremity   5/5  Lower extremity   5/5     Lower extremity   5/5 Tone and bulk:normal tone throughout; no atrophy noted Sensory: Pinprick and light touch intact throughout, bilaterally Deep Tendon Reflexes:  Right: Upper Extremity   Left: Upper extremity   biceps (C-5 to C-6) 2/4   biceps (C-5 to C-6) 2/4 tricep (C7) 2/4    triceps (C7) 2/4 Brachioradialis (C6) 2/4  Brachioradialis (C6) 2/4  Lower Extremity Lower Extremity  quadriceps (L-2 to L-4) 2/4   quadriceps (L-2 to L-4) 2/4 Achilles (S1) 2/4   Achilles (S1) 2/4  Plantars: Right: downgoing   Left: downgoing Cerebellar: normal finger-to-nose,  normal heel-to-shin test Gait:  No tested due to multiple leads    Results for orders placed or performed during the hospital encounter of 11/06/14 (from the past 48 hour(s))  I-Stat Chem 8, ED     Status: Abnormal  Collection Time: 11/06/14  5:13 PM  Result Value Ref Range   Sodium 142 135 - 145 mmol/L   Potassium 4.3 3.5 - 5.1 mmol/L   Chloride 106 96 - 112 mmol/L   BUN 22 6 - 23 mg/dL   Creatinine, Ser 1.00 0.50 - 1.35 mg/dL   Glucose, Bld 110 (H) 70 - 99 mg/dL   Calcium, Ion 1.19 1.13 - 1.30 mmol/L   TCO2 22 0 - 100 mmol/L   Hemoglobin 15.3 13.0 - 17.0 g/dL   HCT 45.0 39.0 - 52.0 %  I-Stat Troponin, ED (not at [facility])     Status: None   Collection Time: 11/06/14  5:14 PM  Result Value Ref Range   Troponin i, poc 0.00 0.00 - 0.08 ng/mL   Comment 3            Comment: Due to the release kinetics of cTnI, a negative result within the first hours of the onset of symptoms does not rule out myocardial infarction  with certainty. If myocardial infarction is still suspected, repeat the test at appropriate intervals.   CBG monitoring, ED     Status: Abnormal   Collection Time: 11/06/14  5:14 PM  Result Value Ref Range   Glucose-Capillary 109 (H) 70 - 99 mg/dL   Ct Head Wo Contrast  11/06/2014   CLINICAL DATA:  Code stroke.  Left-sided weakness.  Aphasia.  EXAM: CT HEAD WITHOUT CONTRAST  TECHNIQUE: Contiguous axial images were obtained from the base of the skull through the vertex without intravenous contrast.  COMPARISON:  Head CT 11/02/2014  FINDINGS: No intracranial hemorrhage, mass effect, or midline shift. No evidence of territorial infarct. Stable chronic small vessel ischemia. No hydrocephalus. The basilar cisterns are patent. No intracranial fluid collection. Calvarium is intact. Included paranasal sinuses and mastoid air cells are well aerated.  IMPRESSION: No CT findings of acute ischemia, or significant change from prior exam.  These results were called by telephone at the time of interpretation on 11/06/2014 at 5:30 pm to Dr. Aram Beecham , who verbally acknowledged these results.   Electronically Signed   By: Jeb Levering M.D.   On: 11/06/2014 17:31   Assessment: 69 y.o. male brought in as a code stroke due to acute onset of  left face weakness, dysarthria, unsteadiness. NIHSS 4, unremarkable CT brain. Probable right brain infarct, but patient with mild deficits and unknown last see normal, thus was not considered for treatment with thrombolytics. Admit to medicine, complete stroke work up. Aspirin after passing swallowing evaluation. Stroke team will follow up tomorrow.  Stroke Risk Factors -  HTN, DM,  Plan: 1. HgbA1c, fasting lipid panel 2. MRI, MRA  of the brain without contrast 3. Echocardiogram 4. Carotid dopplers 5. Prophylactic therapy-aspirin 6. Risk factor modification 7. Telemetry monitoring 8. Frequent neuro checks 9. PT/OT SLP 10. NPO until swallowing evaluation by RN  stroke   Dorian Pod, MD [facility] [phone]  11/06/2014, 5:41 PM

## 2014-11-06 NOTE — ED Notes (Signed)
Pt reminded that he needs to give a urine sample. This RN told the pt that she would have to in and out cath the pt if he could not produce any urine.

## 2014-11-06 NOTE — ED Provider Notes (Signed)
CSN: [id number]     Arrival date & time 11/06/14  1701 History   First MD Initiated Contact with Patient 11/06/14 1707     Chief Complaint  Patient presents with  . Code Stroke    @EDPCLEARED @ (Consider location/radiation/quality/duration/timing/severity/associated sxs/prior Treatment) HPI  Terry Baxter is a 69 y.o. male who presents for evaluation of  aphasia and facial weakness. He also felt like he could not stand up to walk from the chair. He was sitting in because his legs were weak. Time of onset of symptoms, is unknown. He presents by EMS. Patient states he walked to his nephew's house, 3 doors down from his, where he was sitting on the porch when he was found, by a friend. He is unable to specify how long the symptoms were there, or when they started. He states that since they started, he feels much better and is now able to move his legs, but feels like his voice is still altered. He's never had this constellation of problems previously. He has anxiety, but states that it is stable. He denies use of alcohol or illegal drugs. There are no other no modifying factors.   Past Medical History  Diagnosis Date  . Hepatitis C   . Diabetes mellitus   . Hypertension    History reviewed. No pertinent past surgical history. No family history on file. History  Substance Use Topics  . Smoking status: Never Smoker   . Smokeless tobacco: Not on file  . Alcohol Use: No    Review of Systems  All other systems reviewed and are negative.     Allergies  Review of patient's allergies indicates no known allergies.  Home Medications   Prior to Admission medications   Medication Sig Start Date End Date Taking? Authorizing Provider  busPIRone (BUSPAR) 10 MG tablet Take 10 mg by mouth 2 (two) times daily. 03/02/14  Yes Historical Provider, MD  clonazePAM (KLONOPIN) 0.5 MG tablet Take 0.5 mg by mouth 3 (three) times daily as needed for anxiety.   Yes Historical Provider, MD  gabapentin  (NEURONTIN) 600 MG tablet Take 600 mg by mouth daily.    Yes Historical Provider, MD  HYDROcodone-acetaminophen (NORCO/VICODIN) 5-325 MG per tablet Take 1 tablet by mouth every 6 (six) hours as needed for moderate pain.   Yes Historical Provider, MD  levETIRAcetam (KEPPRA) 500 MG tablet Take 500 mg by mouth 2 (two) times daily.   Yes Historical Provider, MD  QUEtiapine (SEROQUEL) 100 MG tablet Take 200 mg by mouth at bedtime. 02/19/14  Yes Historical Provider, MD  risperiDONE (RISPERDAL) 0.5 MG tablet Take 0.5-1 mg by mouth 2 (two) times daily. 0.5 mg in AM, 1 mg in PM   Yes Historical Provider, MD   BP 139/87 mmHg  Pulse 72  Temp(Src) 97.3 F (36.3 C) (Oral)  Resp 14  SpO2 96% Physical Exam  Constitutional: He is oriented to person, place, and time. He appears well-developed and well-nourished.  HENT:  Head: Normocephalic and atraumatic.  Right Ear: External ear normal.  Left Ear: External ear normal.  Eyes: Conjunctivae and EOM are normal. Pupils are equal, round, and reactive to light.  Neck: Normal range of motion and phonation normal. Neck supple.  Cardiovascular: Normal rate, regular rhythm and normal heart sounds.   Pulmonary/Chest: Effort normal and breath sounds normal. He exhibits no bony tenderness.  Abdominal: Soft. There is no tenderness.  Musculoskeletal: Normal range of motion.  Neurological: He is alert and oriented to person, place, and  time. No cranial nerve deficit or sensory deficit. He exhibits normal muscle tone. Coordination normal.  Mild dysarthria. Speech pattern is somewhat unusual; best described as a soft voice with weak and clipped words and sentences. No aphasia or nystagmus. No facial asymmetry, when examined, at 20:30.   Skin: Skin is warm, dry and intact.  Psychiatric: He has a normal mood and affect. His behavior is normal. Judgment and thought content normal.  Nursing note and vitals reviewed.   ED Course  Procedures (including critical care  time)  The patient was evaluated on admission, by the stroke team; Dr. Aram Beecham canceled the code stroke, and did not offer TPA, because the time of onset of symptoms was unknown.  Medications  aspirin EC tablet 81 mg (81 mg Oral Given 11/06/14 1832)    Patient Vitals for the past 24 hrs:  BP Temp Temp src Pulse Resp SpO2  11/06/14 2002 139/87 mmHg - - 72 14 96 %  11/06/14 1830 138/84 mmHg - - 72 15 95 %  11/06/14 1828 136/84 mmHg - - 77 17 97 %  11/06/14 1800 132/81 mmHg - - 74 20 97 %  11/06/14 1753 - 97.3 F (36.3 C) - - - -  11/06/14 1745 129/74 mmHg - - 70 16 95 %  11/06/14 1734 127/81 mmHg 97.3 F (36.3 C) Oral 68 18 94 %    Findings discussed with patient, all questions answered  8:55 PM-Consult complete with Dr. Terrence Dupont. Patient case explained and discussed. He agrees to admit patient for further evaluation and treatment. Call ended at 21:35  Labs Review Labs Reviewed  CBC - Abnormal; Notable for the following:    WBC 10.8 (*)    Platelets 146 (*)    All other components within normal limits  DIFFERENTIAL - Abnormal; Notable for the following:    Neutrophils Relative % 81 (*)    Neutro Abs 8.7 (*)    Lymphocytes Relative 11 (*)    All other components within normal limits  COMPREHENSIVE METABOLIC PANEL - Abnormal; Notable for the following:    Glucose, Bld 108 (*)    GFR calc non Af Amer 61 (*)    GFR calc Af Amer 71 (*)    All other components within normal limits  I-STAT CHEM 8, ED - Abnormal; Notable for the following:    Glucose, Bld 110 (*)    All other components within normal limits  CBG MONITORING, ED - Abnormal; Notable for the following:    Glucose-Capillary 109 (*)    All other components within normal limits  ETHANOL  PROTIME-INR  APTT  URINE RAPID DRUG SCREEN (HOSP PERFORMED)  URINALYSIS, ROUTINE W REFLEX MICROSCOPIC  HEMOGLOBIN A1C  LIPID PANEL  I-STAT TROPOININ, ED  I-STAT TROPOININ, ED    Imaging Review Ct Head Wo Contrast  11/06/2014    CLINICAL DATA:  Code stroke.  Left-sided weakness.  Aphasia.  EXAM: CT HEAD WITHOUT CONTRAST  TECHNIQUE: Contiguous axial images were obtained from the base of the skull through the vertex without intravenous contrast.  COMPARISON:  Head CT 11/02/2014  FINDINGS: No intracranial hemorrhage, mass effect, or midline shift. No evidence of territorial infarct. Stable chronic small vessel ischemia. No hydrocephalus. The basilar cisterns are patent. No intracranial fluid collection. Calvarium is intact. Included paranasal sinuses and mastoid air cells are well aerated.  IMPRESSION: No CT findings of acute ischemia, or significant change from prior exam.  These results were called by telephone at the time of interpretation on  11/06/2014 at 5:30 pm to Dr. Aram Beecham , who verbally acknowledged these results.   Electronically Signed   By: Jeb Levering M.D.   On: 11/06/2014 17:31   Mr Jodene Nam Head Wo Contrast  11/06/2014   CLINICAL DATA:  Aphasia.  Left facial droop.  Stroke.  EXAM: MRI HEAD WITHOUT CONTRAST  MRA HEAD WITHOUT CONTRAST  TECHNIQUE: Multiplanar, multiecho pulse sequences of the brain and surrounding structures were obtained without intravenous contrast. Angiographic images of the head were obtained using MRA technique without contrast.  COMPARISON:  CT head 11/06/2014  FINDINGS: MRI HEAD FINDINGS  Negative for acute infarct  Mild atrophy.  Negative for hydrocephalus.  Mild chronic microvascular ischemic change in the white matter. Brainstem and cerebellum intact.  Pituitary normal in size. Craniocervical junction normal. Paranasal sinuses clear.  Negative for intracranial hemorrhage.  Negative for mass or edema.  No shift of the midline structures.  MRA HEAD FINDINGS  Image quality degraded by moderate motion.  This limits the exam.  Left vertebral artery patent to the basilar. Right vertebral artery not visualized and may be occluded in the neck. Basilar artery is patent. Fetal origin of the posterior cerebral  artery bilaterally with hypoplastic distal basilar.  Internal carotid artery patent bilaterally. Anterior and middle cerebral arteries are degraded by significant motion but appear patent.  This study is not of adequate quality to exclude cerebral aneurysm.  IMPRESSION: Mild chronic microvascular ischemia.  No acute infarct  Limited MRA of the brain due to motion.  No large vessel occlusion.   Electronically Signed   By: Franchot Gallo M.D.   On: 11/06/2014 19:44   Mr Brain Wo Contrast  11/06/2014   CLINICAL DATA:  Aphasia.  Left facial droop.  Stroke.  EXAM: MRI HEAD WITHOUT CONTRAST  MRA HEAD WITHOUT CONTRAST  TECHNIQUE: Multiplanar, multiecho pulse sequences of the brain and surrounding structures were obtained without intravenous contrast. Angiographic images of the head were obtained using MRA technique without contrast.  COMPARISON:  CT head 11/06/2014  FINDINGS: MRI HEAD FINDINGS  Negative for acute infarct  Mild atrophy.  Negative for hydrocephalus.  Mild chronic microvascular ischemic change in the white matter. Brainstem and cerebellum intact.  Pituitary normal in size. Craniocervical junction normal. Paranasal sinuses clear.  Negative for intracranial hemorrhage.  Negative for mass or edema.  No shift of the midline structures.  MRA HEAD FINDINGS  Image quality degraded by moderate motion.  This limits the exam.  Left vertebral artery patent to the basilar. Right vertebral artery not visualized and may be occluded in the neck. Basilar artery is patent. Fetal origin of the posterior cerebral artery bilaterally with hypoplastic distal basilar.  Internal carotid artery patent bilaterally. Anterior and middle cerebral arteries are degraded by significant motion but appear patent.  This study is not of adequate quality to exclude cerebral aneurysm.  IMPRESSION: Mild chronic microvascular ischemia.  No acute infarct  Limited MRA of the brain due to motion.  No large vessel occlusion.   Electronically Signed    By: Franchot Gallo M.D.   On: 11/06/2014 19:44     EKG Interpretation   Date/Time:  Monday November 06 2014 17:21:36 EDT Ventricular Rate:  72 PR Interval:  155 QRS Duration: 83 QT Interval:  396 QTC Calculation: 433 R Axis:   17 Text Interpretation:  Sinus rhythm Probable left atrial enlargement Low  voltage, precordial leads since last tracing no significant change  Confirmed by Eulis Foster  MD, Marieke Lubke [phone]) on 11/06/2014 5:28:17  PM      MDM   Final diagnoses:  Stroke with cerebral ischemia    Acute neurologic symptoms characterized by left facial weakness, dysarthria, and bilateral lower extremity weakness. Symptoms are improving, but not completely resolved in the emergency department. MRI brain is negative for acute abnormality. Symptoms possibly consistent with encephalopathy, unspecified, or TIA. He will need to be admitted for further evaluation, and intervention, expectantly.  Nursing Notes Reviewed/ Care Coordinated Applicable Imaging Reviewed Interpretation of Laboratory Data incorporated into ED treatment  Plan: Admit  Daleen Bo, MD 11/06/14 2138

## 2014-11-06 NOTE — ED Notes (Signed)
This RN attempted to in an out cath the pt, but could not get the catheter past the pt's prostate.

## 2014-11-06 NOTE — ED Notes (Signed)
When pt came back from MRI this NT asked pt for urine. Pt stated he had already went to the bathroom while in his MRI. Mandi NT and this NT washed and rinsed the pt off and then changed his gown and linen

## 2014-11-06 NOTE — ED Notes (Signed)
Mardene Celeste, RN also attempted to in and out cath the pt but could not get the pt's prostate.

## 2014-11-06 NOTE — ED Notes (Addendum)
Pt presents from home via [facility] with c/o aphasia and slight left sided facial droop.  Pt also had a fall on Saturday and has hx seizures per EMS.  Pt's friend waved at patient today around 1530 in passing when patient was sitting on his front porch - however they did not actually speak until about 1620.  Patient's friend then contacted EMS.  Per Dr. Aram Beecham, Last seen normal time is unknown.

## 2014-11-07 ENCOUNTER — Inpatient Hospital Stay (HOSPITAL_COMMUNITY): Payer: Medicare Other

## 2014-11-07 DIAGNOSIS — E785 Hyperlipidemia, unspecified: Secondary | ICD-10-CM

## 2014-11-07 DIAGNOSIS — G451 Carotid artery syndrome (hemispheric): Secondary | ICD-10-CM

## 2014-11-07 DIAGNOSIS — I10 Essential (primary) hypertension: Secondary | ICD-10-CM

## 2014-11-07 DIAGNOSIS — E1159 Type 2 diabetes mellitus with other circulatory complications: Secondary | ICD-10-CM

## 2014-11-07 DIAGNOSIS — G459 Transient cerebral ischemic attack, unspecified: Principal | ICD-10-CM

## 2014-11-07 LAB — GLUCOSE, CAPILLARY
Glucose-Capillary: 87 mg/dL (ref 70–99)
Glucose-Capillary: 109 mg/dL — ABNORMAL HIGH (ref 70–99)
Glucose-Capillary: 103 mg/dL — ABNORMAL HIGH (ref 70–99)
Glucose-Capillary: 101 mg/dL — ABNORMAL HIGH (ref 70–99)

## 2014-11-07 LAB — LIPID PANEL
CHOL/HDL RATIO: 3.5 ratio
Cholesterol: 193 mg/dL (ref 0–200)
HDL: 55 mg/dL (ref >39)
LDL Cholesterol: 127 mg/dL — ABNORMAL HIGH (ref 0–99)
TRIGLYCERIDES: 53 mg/dL (ref <150)
VLDL: 11 mg/dL (ref 0–40)

## 2014-11-07 LAB — URINALYSIS, ROUTINE W REFLEX MICROSCOPIC
Bilirubin Urine: NEGATIVE
GLUCOSE, UA: NEGATIVE mg/dL
Ketones, ur: NEGATIVE mg/dL
Nitrite: NEGATIVE
PROTEIN: NEGATIVE mg/dL
Specific Gravity, Urine: 1.016 (ref 1.005–1.030)
Urobilinogen, UA: 1 mg/dL (ref 0.0–1.0)
pH: 6.5 (ref 5.0–8.0)

## 2014-11-07 LAB — RAPID URINE DRUG SCREEN, HOSP PERFORMED
Amphetamines: NOT DETECTED
Barbiturates: NOT DETECTED
Benzodiazepines: POSITIVE — AB
COCAINE: NOT DETECTED
OPIATES: POSITIVE — AB
Tetrahydrocannabinol: POSITIVE — AB

## 2014-11-07 LAB — URINE MICROSCOPIC-ADD ON

## 2014-11-07 MED ORDER — ACETAMINOPHEN 325 MG PO TABS
650.0000 mg | ORAL_TABLET | Freq: Three times a day (TID) | ORAL | Status: DC | PRN
  Administered 2014-11-07 – 2014-11-09 (×5): 650 mg via ORAL
  Filled 2014-11-07 (×5): qty 2

## 2014-11-07 NOTE — Evaluation (Signed)
Physical Therapy Evaluation Patient Details Name: Terry Baxter MRN: [id number] DOB: 12/05/1945 Today's Date: 11/07/2014   History of Present Illness  69 yo male presented with dysarthria, s/p fall , L facial droop and changes in gait. MRI (-) PMH: seizures, DM, Hep C  Clinical Impression  Patient demonstrates deficits in functional mobility as indicated below. Will benefit from continued skilled PT to address deficits and maximize function. Will see as indicated and progress as tolerated.     Follow Up Recommendations Home health PT;Supervision/Assistance - 24 hour    Equipment Recommendations  None recommended by PT    Recommendations for Other Services       Precautions / Restrictions Precautions Precautions: Fall Precaution Comments: fall last week pushing trash can Restrictions Weight Bearing Restrictions: No      Mobility  Bed Mobility Overal bed mobility: Modified Independent                Transfers Overall transfer level: Needs assistance Equipment used: Rolling walker (2 wheeled) Transfers: Sit to/from Stand Sit to Stand: Min guard         General transfer comment: cues for hand placement  Ambulation/Gait Ambulation/Gait assistance: Min guard Ambulation Distance (Feet): 120 Feet (60 with RW, 60 pushing IV pole) Assistive device: Rolling walker (2 wheeled) (IV pole) Gait Pattern/deviations: Step-through pattern;Decreased stride length;Drifts right/left Gait velocity: decreased Gait velocity interpretation: Below normal speed for age/gender General Gait Details: Visual deficits impacting mobility, but overall ambulating with no significant LOB noted  Stairs Stairs: Yes Stairs assistance: Min guard Stair Management: One rail Left;Step to pattern;Forwards Number of Stairs: 3    Wheelchair Mobility    Modified Rankin (Stroke Patients Only) Modified Rankin (Stroke Patients Only) Pre-Morbid Rankin Score: Moderate disability Modified Rankin:  Moderately severe disability     Balance           Standing balance support: Bilateral upper extremity supported Standing balance-Leahy Scale: Fair                               Pertinent Vitals/Pain Pain Assessment: No/denies pain    Home Living Family/patient expects to be discharged to:: Private residence Living Arrangements: Spouse/significant other Available Help at Discharge: Friend(s);Available PRN/intermittently Type of Home: House Home Access: Stairs to enter     Home Layout: One level Home Equipment: Cane - single point;Shower seat      Prior Function Level of Independence: Independent with assistive device(s)               Hand Dominance   Dominant Hand: Right    Extremity/Trunk Assessment   Upper Extremity Assessment: Overall WFL for tasks assessed           Lower Extremity Assessment: Generalized weakness      Cervical / Trunk Assessment: Normal  Communication   Communication: Other (comment) (very soft spoken)  Cognition Arousal/Alertness: Awake/alert Behavior During Therapy: Flat affect Overall Cognitive Status: Impaired/Different from baseline Area of Impairment: Memory;Safety/judgement     Memory: Decreased short-term memory   Safety/Judgement: Decreased awareness of safety     General Comments: Patient with some delayed processing in response to questions, but able to answer appropriately with increased time    General Comments General comments (skin integrity, edema, etc.): wounds bil knees    Exercises        Assessment/Plan    PT Assessment Patient needs continued PT services  PT Diagnosis Difficulty walking;Generalized weakness  PT Problem List Decreased strength;Decreased activity tolerance;Decreased balance;Decreased mobility;Decreased cognition  PT Treatment Interventions DME instruction;Gait training;Stair training;Functional mobility training;Therapeutic activities;Therapeutic exercise;Balance  training;Patient/family education   PT Goals (Current goals can be found in the Care Plan section) Acute Rehab PT Goals Patient Stated Goal: none stated PT Goal Formulation: With patient Time For Goal Achievement: 11/21/14 Potential to Achieve Goals: Good    Frequency Min 3X/week   Barriers to discharge Decreased caregiver support      Co-evaluation               End of Session Equipment Utilized During Treatment: Gait belt Activity Tolerance: Patient tolerated treatment well;No increased pain Patient left: in bed;with call bell/phone within reach;with bed alarm set;with nursing/sitter in room Nurse Communication: Mobility status         Time: 8527-7824 PT Time Calculation (min) (ACUTE ONLY): 22 min   Charges:   PT Evaluation $Initial PT Evaluation Tier I: 1 Procedure     PT G CodesDuncan Dull 12-03-2014, 5:10 PM Alben Deeds, [place] DPT  [phone]

## 2014-11-07 NOTE — Progress Notes (Signed)
OT NOTE  OT evaluation completed and documentation to follow. OT recommending HHOT and RW for d/c home with PRN (A). Pt demonstrates mild cognitive deficits question if baseline. No family present to determine and pt very soft spoken with direct answers only. Pt self reports feeling like speech is close to baseline. Pt with x1 fall pushing trash can within last 7 days. Pt uncertain to the direct time/ day of the fall.   Jeri Modena   OTR/L Pager: [phone] Office: [phone] .

## 2014-11-07 NOTE — Progress Notes (Signed)
Pt arrived to 4N08 from ED. Pt alert and oriented x 4. In no acute distress. Vital signs stable. Pt oriented to room and call bell.

## 2014-11-07 NOTE — Progress Notes (Signed)
VASCULAR LAB PRELIMINARY  PRELIMINARY  PRELIMINARY  PRELIMINARY  Carotid Duplex completed.    Preliminary report:  1-39% stenosis bilaterally.   Vertebral arteries are patent and antegrade bilaterally.                                  August Albino, RVT 11/07/2014, 3:58 PM

## 2014-11-07 NOTE — Progress Notes (Signed)
UR complete.  Marvelous Woolford RN, MSN 

## 2014-11-07 NOTE — Evaluation (Signed)
SLP Cancellation Note  Patient Details Name: Terry Baxter MRN: [id number] DOB: 06-29-1946   Cancelled treatment:       Reason Eval/Treat Not Completed: Other (comment) (pt with MD, will reattempt eval as schedule allows)   Macario Golds 11/07/2014, 10:01 AM

## 2014-11-07 NOTE — Procedures (Signed)
ELECTROENCEPHALOGRAM REPORT   Patient: Terry Baxter       Room #: 8J85 EEG No. ID: [id number]  Age: 69 y.o.        Sex: male Referring Physician: Terrence Dupont Report Date:  11/07/2014        Interpreting Physician: Alexis Goodell  History: Marcus Schwandt is an 69 y.o. male with a history of seizures who presents with left facial weakness and slurred speech  Medications:  Scheduled: . aspirin  300 mg Rectal Daily   Or  . aspirin  325 mg Oral Daily  . atorvastatin  40 mg Oral q1800  . gabapentin  300 mg Oral BID  . heparin  5,000 Units Subcutaneous 3 times per day  . insulin aspart  0-5 Units Subcutaneous QHS  . insulin aspart  0-9 Units Subcutaneous TID WC  . levETIRAcetam  500 mg Oral BID    Conditions of Recording:  This is a 16 channel EEG carried out with the patient in the awake state.  Description:  The waking background activity consists of a low voltage, symmetrical, fairly well organized, 8-9 Hz alpha activity, seen from the parieto-occipital and posterior temporal regions.  Low voltage fast activity, poorly organized, is seen anteriorly and is at times superimposed on more posterior regions.  A mixture of theta and alpha rhythms are seen from the central and temporal regions. The patient drowses with slowing to irregular, low voltage theta and beta activity.   The patient does not drowse or sleep. No epileptiform activity is noted.   Hyperventilation was not performed.  Intermittent photic stimulation was performed but failed to illicit any change in the tracing.     IMPRESSION: Normal awake electroencephalogram with activation procedures. There are no focal lateralizing or epileptiform features.  Comment:  An EEG with the patient sleep deprived to elicit drowse and light sleep may be desirable to further elicit a possible seizure disorder.     Alexis Goodell, MD [facility] [phone] 11/07/2014, 12:24 PM

## 2014-11-07 NOTE — Progress Notes (Signed)
EEG Completed; Results Pending  

## 2014-11-07 NOTE — Progress Notes (Signed)
CARE MANAGEMENT NOTE 11/07/2014  Patient:  Terry Baxter, Terry Baxter   Account Number:  [id number]  Date Initiated:  11/07/2014  Documentation initiated by:  Lorne Skeens  Subjective/Objective Assessment:   Patient was admitted for stroke work-up.  Lives at home with spouse.     Action/Plan:   Will follow for discharge needs pending PT/OT evals and physician orders.   Anticipated DC Date:  11/08/2014   Anticipated DC Plan:  [place]         Choice offered to / List presented to:             Status of service:  In process, will continue to follow Medicare Important Message given?   (If response is "NO", the following Medicare IM given date fields will be blank) Date Medicare IM given:   Medicare IM given by:   Date Additional Medicare IM given:   Additional Medicare IM given by:    Discharge Disposition:    Per UR Regulation:  Reviewed for med. necessity/level of care/duration of stay  If discussed at [place] of Stay Meetings, dates discussed:    Comments:

## 2014-11-07 NOTE — Progress Notes (Signed)
PT Cancellation Note  Patient Details Name: Terry Baxter MRN: [id number] DOB: [birth year]   Cancelled Treatment:    Reason Eval/Treat Not Completed: Patient at procedure or test/unavailable (will follow up )   Duncan Dull 11/07/2014, 12:00 PM Alben Deeds, [place] DPT  [phone]

## 2014-11-07 NOTE — Evaluation (Signed)
Occupational Therapy Evaluation Patient Details Name: Terry Baxter MRN: [id number] DOB: [birth year] Today's Date: 11/07/2014    History of Present Illness 69 yo male presented with dysarthria, s/p fall , L facial droop and changes in gait. MRI (-) PMH: seizures, DM, Hep C   Clinical Impression   PT admitted with TIA workup underway. MRI (-). Pt currently with functional limitiations due to the deficits listed below (see OT problem list). Pt with recent fall and wounds present. Pt with balance deficits and requires home health to address deficits.  Pt will benefit from skilled OT to increase their independence and safety with adls and balance to allow discharge [place] and RW. Question cognitive changes to be further assessed next session. Pt very soft spoken and reports feeling speech is close to baseline.      Follow Up Recommendations  Home health OT;Supervision - Intermittent    Equipment Recommendations  Other (comment) (RW)    Recommendations for Other Services       Precautions / Restrictions Precautions Precautions: Fall Precaution Comments: fall last week pushing trash can      Mobility Bed Mobility Overal bed mobility: Modified Independent                Transfers Overall transfer level: Needs assistance Equipment used: Rolling walker (2 wheeled) Transfers: Sit to/from Stand Sit to Stand: Min assist         General transfer comment: cues for hand placement    Balance Overall balance assessment: Needs assistance         Standing balance support: Bilateral upper extremity supported;During functional activity Standing balance-Leahy Scale: Fair                              ADL Overall ADL's : Needs assistance/impaired Eating/Feeding: Independent   Grooming: Wash/dry hands;Independent                   Toilet Transfer: Minimal assistance;Ambulation;RW Toilet Transfer Details (indicate cue type and reason): cues for hand  placement       Tub/Shower Transfer Details (indicate cue type and reason): pt will need to be able to complete tub transfer stepping into tub supervision level Functional mobility during ADLs: Min guard;Rolling walker General ADL Comments: Pt completed bed mobility and basic transfer this session. pt is not back to baseline at this time and has a very recent fall hx. Pt with bruises and wounds on bil LE. Pt reports falling pushing the trash can to the road.      Vision Vision Assessment?: No apparent visual deficits   Perception     Praxis      Pertinent Vitals/Pain Pain Assessment: No/denies pain     Hand Dominance Right   Extremity/Trunk Assessment Upper Extremity Assessment Upper Extremity Assessment: Overall WFL for tasks assessed   Lower Extremity Assessment Lower Extremity Assessment: Defer to PT evaluation   Cervical / Trunk Assessment Cervical / Trunk Assessment: Normal   Communication Communication Communication: Other (comment) (very soft spoken)   Cognition Arousal/Alertness: Awake/alert Behavior During Therapy: Flat affect Overall Cognitive Status: Impaired/Different from baseline Area of Impairment: Memory;Safety/judgement     Memory: Decreased short-term memory   Safety/Judgement: Decreased awareness of safety     General Comments: Pt walking opposite direction of room and reports room number 8. OT reading room numbers off to demonstrate descending order to help with problem solving. Pt walking to the opening of hte hall and  unable to find room. Pt returning toward room and locating second attempt. pt very simple direct responses to all questions so difficult to fully assess cognitive deficits. Pt able to complete basic task without error during session. Pt with unsafe use of RW however pt does not use RW at baseline. Pt will require further education on safety with RW   General Comments       Exercises       Shoulder Instructions      Home  Living Family/patient expects to be discharged to:: Private residence Living Arrangements: Spouse/significant other Available Help at Discharge: Friend(s);Available PRN/intermittently Type of Home: House Home Access: Stairs to enter     Home Layout: One level     Bathroom Shower/Tub: Tub/shower unit Shower/tub characteristics: Door Biochemist, clinical: Standard     Home Equipment: Cane - single point;Shower seat          Prior Functioning/Environment Level of Independence: Independent with assistive device(s)             OT Diagnosis: Generalized weakness;Cognitive deficits   OT Problem List: Decreased strength;Decreased activity tolerance;Decreased range of motion;Impaired balance (sitting and/or standing);Decreased cognition;Decreased safety awareness;Decreased knowledge of use of DME or AE;Decreased knowledge of precautions;Obesity   OT Treatment/Interventions: Self-care/ADL training;Therapeutic exercise;DME and/or AE instruction;Therapeutic activities;Cognitive remediation/compensation;Patient/family education;Balance training    OT Goals(Current goals can be found in the care plan section) Acute Rehab OT Goals Patient Stated Goal: none stated OT Goal Formulation: With patient/family Time For Goal Achievement: 11/21/14 Potential to Achieve Goals: Good  OT Frequency: Min 2X/week   Barriers to D/C:            Co-evaluation              End of Session Equipment Utilized During Treatment: Gait belt;Rolling walker Nurse Communication: Mobility status;Precautions  Activity Tolerance: Patient tolerated treatment well Patient left: in chair;with call bell/phone within reach   Time: 0735-0758 OT Time Calculation (min): 23 min Charges:  OT General Charges $OT Visit: 1 Procedure OT Evaluation $Initial OT Evaluation Tier I: 1 Procedure G-Codes:    Parke Poisson B November 21, 2014, 8:41 AM  Pager: [phone]

## 2014-11-07 NOTE — Progress Notes (Signed)
STROKE TEAM PROGRESS NOTE   HISTORY Terry Baxter is an 69 y.o. male with a past medical history significant for HTN, DM, hepatitis C, and seizures, brought in via EMS as a code stroke due to acute onset of left face weakness, dysarthria, unsteadiness. As per EMS, patient was visiting some friends in her neighborhood and was noted to have left face weakness and slurred speech. EMS was summoned and he was unsteady on his feet. last known well 11/06/14, time unknown. Patient denies similar symptoms before but was seen in the ED few days ago after sustaining a fall. Denies associated HA, vertigo, double vision, focal weakness, or visual disturbances. NIHSS 4.  CT brain showed no acute abnormality. Patient was not administered TPA secondary to out of the window. He was admitted for further evaluation and treatment.   SUBJECTIVE (INTERVAL HISTORY) No family is at the bedside.  Overall he feels his condition is stable. Reports an episode on 10/17/13 with passing out. The current episode with bilateral LE weakness and left facial droop and slurry speech.  Denies LOC or seizure-like activity. Resolved in 2 hours. He had lower back surgery in the past.   OBJECTIVE Temp:  [97.3 F (36.3 C)-99.2 F (37.3 C)] 97.5 F (36.4 C) (04/12 0600) Pulse Rate:  [61-89] 61 (04/12 0600) Cardiac Rhythm:  [-] Normal sinus rhythm (04/12 0000) Resp:  [12-24] 16 (04/12 0600) BP: (116-155)/(70-95) 144/76 mmHg (04/12 0600) SpO2:  [94 %-100 %] 97 % (04/12 0600) Weight:  [120.203 kg (265 lb)] 120.203 kg (265 lb) (04/12 0900)   Recent Labs Lab 11/06/14 1714 11/06/14 2332 11/07/14 0651  GLUCAP 109* 96 87    Recent Labs Lab 11/06/14 1713 11/06/14 1740  NA 142 139  K 4.3 3.9  CL 106 106  CO2  --  24  GLUCOSE 110* 108*  BUN 22 15  CREATININE 1.00 1.19  CALCIUM  --  8.9    Recent Labs Lab 11/06/14 1740  AST 16  ALT 10  ALKPHOS 73  BILITOT 0.5  PROT 7.0  ALBUMIN 3.5    Recent Labs Lab 11/06/14 1713  11/06/14 1740  WBC  --  10.8*  NEUTROABS  --  8.7*  HGB 15.3 13.3  HCT 45.0 40.4  MCV  --  94.2  PLT  --  146*   No results for input(s): CKTOTAL, CKMB, CKMBINDEX, TROPONINI in the last 168 hours.  Recent Labs  11/06/14 1740  LABPROT 13.8  INR 1.05    Recent Labs  11/07/14 0511  COLORURINE YELLOW  LABSPEC 1.016  PHURINE 6.5  GLUCOSEU NEGATIVE  HGBUR LARGE*  BILIRUBINUR NEGATIVE  KETONESUR NEGATIVE  PROTEINUR NEGATIVE  UROBILINOGEN 1.0  NITRITE NEGATIVE  LEUKOCYTESUR TRACE*       Component Value Date/Time   CHOL 193 11/07/2014 0707   TRIG 53 11/07/2014 0707   HDL 55 11/07/2014 0707   CHOLHDL 3.5 11/07/2014 0707   VLDL 11 11/07/2014 0707   LDLCALC 127* 11/07/2014 0707   Lab Results  Component Value Date   HGBA1C 5.5 10/20/2013      Component Value Date/Time   LABOPIA POSITIVE* 11/07/2014 0511   COCAINSCRNUR NONE DETECTED 11/07/2014 0511   LABBENZ POSITIVE* 11/07/2014 0511   AMPHETMU NONE DETECTED 11/07/2014 0511   THCU POSITIVE* 11/07/2014 0511   LABBARB NONE DETECTED 11/07/2014 0511     Recent Labs Lab 11/06/14 1745  ETH <5   I have personally reviewed the radiological images below and agree with the radiology interpretations.  Ct  Head Wo Contrast 11/06/2014   No CT findings of acute ischemia, or significant change from prior exam.    Mr Brain Wo Contrast 11/06/2014    Mild chronic microvascular ischemia.  No acute infarct    Mr Virgel Paling Wo Contrast 11/06/2014    Limited MRA of the brain due to motion.  No large vessel occlusion.     CUS - 1-39% stenosis bilaterally. Vertebral arteries are patent and antegrade bilaterally.  EEG - Normal awake electroencephalogram with activation procedures. There are no focal lateralizing or epileptiform features. Comment: An EEG with the patient sleep deprived to elicit drowse and light sleep may be desirable to further elicit a possible seizure disorder.  2D echo - pending  PHYSICAL EXAM  Temp:  [97.5  F (36.4 C)-99.2 F (37.3 C)] 98.8 F (37.1 C) (04/12 1758) Pulse Rate:  [61-89] 75 (04/12 1758) Resp:  [12-24] 18 (04/12 1758) BP: (116-155)/(70-95) 131/81 mmHg (04/12 1758) SpO2:  [94 %-100 %] 98 % (04/12 1758) Weight:  [265 lb (120.203 kg)] 265 lb (120.203 kg) (04/12 0900)  General - Well nourished, well developed, in no apparent distress.  Ophthalmologic - Sharp disc margins OU.  Cardiovascular - Regular rate and rhythm with no murmur.  Mental Status -  Level of arousal and orientation to time, place, and person were intact. Language including expression, naming, repetition, comprehension was assessed and found intact. Fund of Knowledge was assessed and was intact.  Cranial Nerves II - XII - II - Visual field intact OU. III, IV, VI - Extraocular movements intact. V - Facial sensation intact bilaterally. VII - Facial movement intact bilaterally. VIII - Hearing & vestibular intact bilaterally. X - Palate elevates symmetrically. XI - Chin turning & shoulder shrug intact bilaterally. XII - Tongue protrusion intact.  Motor Strength - The patient's strength was normal in all extremities and pronator drift was absent.  Bulk was normal and fasciculations were absent.   Motor Tone - Muscle tone was assessed at the neck and appendages and was normal.  Reflexes - The patient's reflexes were 1+ in all extremities and he had no pathological reflexes.  Sensory - Light touch, temperature/pinprick were assessed and were symmetrical.    Coordination - The patient had normal movements in the hands and feet with no ataxia or dysmetria.  Tremor was absent.  Gait and Station - deferred due to safety concerns.   ASSESSMENT/PLAN Mr. Terry Baxter is a 69 y.o. male with history of HTN, DM, hepatitis C, and seizures presenting with left face weakness, dysarthria, unsteadiness. He did not receive IV t-PA due to delay in arrival.   Possible TIA   Resultant  Neuro deficits resolved  MRI  No  acute stroke  MRA  No large vessel occlusion  Carotid Doppler  unremarkable   2D Echo  pending   LDL 127, not at goal  HgbA1c pending  .Heparin 5000 units sq tid for VTE prophylaxis Diet Heart Room service appropriate?: Yes; Fluid consistency:: Thin  no antithrombotic prior to admission, now on aspirin 325 mg orally every day  Therapy recommendations:  HHOT, RW  Disposition:  Anticipate return home with therapies   Hypertension  Home meds:   None listed  Stable  Hyperlipidemia  Home meds:  No statin  LDL 127  New lipitor 40 mg daily  Continue statin at discharge  Other Stroke Risk Factors  UDS positive for opiates, benzos and THC. Recovering heroin addict.  Morbid Obesity, Body mass index is 40.3 kg/(m^2).  Seizures  On Keppra PTA  Typically no prodrome/aura  EEG pending  Other Active Problems  Hepatitis C  Hospital day # Terry Baxter for Pager information 11/07/2014 9:56 AM   I, the attending vascular neurologist, have personally obtained a history, examined the patient, evaluated laboratory data, individually viewed imaging studies and agree with radiology interpretations. Together with the NP/PA, we formulated the assessment and plan of care which reflects our mutual decision.  I have made any additions or clarifications directly to the above note and agree with the findings and plan as currently documented.   69 yo M with PMH of HTN, DM, seizure on keppra, hepC was admitted for possible TIA episode. Symptoms resolved. Stroke w/u including MRI, MRA, CUS negative. LDL 127, put on lipitor. Continue ASA and keppra. 2D echo and A1C pending. Aggressive risk factor modification.   Rosalin Hawking, MD PhD Stroke Neurology 11/07/2014 6:42 PM    To contact Stroke Continuity provider, please refer to [email or web address] After hours, contact General Neurology

## 2014-11-07 NOTE — Progress Notes (Signed)
Subjective:  Patient denies any chest pain or shortness of breath. More awake today denies any weakness.  Objective:  Vital Signs in the last 24 hours: Temp:  [97.3 F (36.3 C)-99.2 F (37.3 C)] 97.5 F (36.4 C) (04/12 0600) Pulse Rate:  [61-89] 61 (04/12 0600) Resp:  [12-24] 16 (04/12 0600) BP: (116-155)/(70-95) 144/76 mmHg (04/12 0600) SpO2:  [94 %-100 %] 97 % (04/12 0600) Weight:  [120.203 kg (265 lb)] 120.203 kg (265 lb) (04/12 0900)  Intake/Output from previous day: 04/11 0701 - 04/12 0700 In: 240 [P.O.:240] Out: 475 [Urine:475] Intake/Output from this shift:    Physical Exam: Neck: no adenopathy, no carotid bruit, no JVD and supple, symmetrical, trachea midline Lungs: clear to auscultation bilaterally Heart: regular rate and rhythm, S1, S2 normal and Soft systolic murmur noted no S3 gallop Abdomen: soft, non-tender; bowel sounds normal; no masses,  no organomegaly Extremities: extremities normal, atraumatic, no cyanosis or edema  Lab Results:  Recent Labs  11/06/14 1713 11/06/14 1740  WBC  --  10.8*  HGB 15.3 13.3  PLT  --  146*    Recent Labs  11/06/14 1713 11/06/14 1740  NA 142 139  K 4.3 3.9  CL 106 106  CO2  --  24  GLUCOSE 110* 108*  BUN 22 15  CREATININE 1.00 1.19   No results for input(s): TROPONINI in the last 72 hours.  Invalid input(s): CK, MB Hepatic Function Panel  Recent Labs  11/06/14 1740  PROT 7.0  ALBUMIN 3.5  AST 16  ALT 10  ALKPHOS 73  BILITOT 0.5    Recent Labs  11/07/14 0707  CHOL 193   No results for input(s): PROTIME in the last 72 hours.  Imaging: Imaging results have been reviewed and Ct Head Wo Contrast  11/06/2014   CLINICAL DATA:  Code stroke.  Left-sided weakness.  Aphasia.  EXAM: CT HEAD WITHOUT CONTRAST  TECHNIQUE: Contiguous axial images were obtained from the base of the skull through the vertex without intravenous contrast.  COMPARISON:  Head CT 11/02/2014  FINDINGS: No intracranial hemorrhage, mass  effect, or midline shift. No evidence of territorial infarct. Stable chronic small vessel ischemia. No hydrocephalus. The basilar cisterns are patent. No intracranial fluid collection. Calvarium is intact. Included paranasal sinuses and mastoid air cells are well aerated.  IMPRESSION: No CT findings of acute ischemia, or significant change from prior exam.  These results were called by telephone at the time of interpretation on 11/06/2014 at 5:30 pm to Dr. Aram Beecham , who verbally acknowledged these results.   Electronically Signed   By: Jeb Levering M.D.   On: 11/06/2014 17:31   Mr Jodene Nam Head Wo Contrast  11/06/2014   CLINICAL DATA:  Aphasia.  Left facial droop.  Stroke.  EXAM: MRI HEAD WITHOUT CONTRAST  MRA HEAD WITHOUT CONTRAST  TECHNIQUE: Multiplanar, multiecho pulse sequences of the brain and surrounding structures were obtained without intravenous contrast. Angiographic images of the head were obtained using MRA technique without contrast.  COMPARISON:  CT head 11/06/2014  FINDINGS: MRI HEAD FINDINGS  Negative for acute infarct  Mild atrophy.  Negative for hydrocephalus.  Mild chronic microvascular ischemic change in the white matter. Brainstem and cerebellum intact.  Pituitary normal in size. Craniocervical junction normal. Paranasal sinuses clear.  Negative for intracranial hemorrhage.  Negative for mass or edema.  No shift of the midline structures.  MRA HEAD FINDINGS  Image quality degraded by moderate motion.  This limits the exam.  Left vertebral artery patent  to the basilar. Right vertebral artery not visualized and may be occluded in the neck. Basilar artery is patent. Fetal origin of the posterior cerebral artery bilaterally with hypoplastic distal basilar.  Internal carotid artery patent bilaterally. Anterior and middle cerebral arteries are degraded by significant motion but appear patent.  This study is not of adequate quality to exclude cerebral aneurysm.  IMPRESSION: Mild chronic microvascular  ischemia.  No acute infarct  Limited MRA of the brain due to motion.  No large vessel occlusion.   Electronically Signed   By: Franchot Gallo M.D.   On: 11/06/2014 19:44   Mr Brain Wo Contrast  11/06/2014   CLINICAL DATA:  Aphasia.  Left facial droop.  Stroke.  EXAM: MRI HEAD WITHOUT CONTRAST  MRA HEAD WITHOUT CONTRAST  TECHNIQUE: Multiplanar, multiecho pulse sequences of the brain and surrounding structures were obtained without intravenous contrast. Angiographic images of the head were obtained using MRA technique without contrast.  COMPARISON:  CT head 11/06/2014  FINDINGS: MRI HEAD FINDINGS  Negative for acute infarct  Mild atrophy.  Negative for hydrocephalus.  Mild chronic microvascular ischemic change in the white matter. Brainstem and cerebellum intact.  Pituitary normal in size. Craniocervical junction normal. Paranasal sinuses clear.  Negative for intracranial hemorrhage.  Negative for mass or edema.  No shift of the midline structures.  MRA HEAD FINDINGS  Image quality degraded by moderate motion.  This limits the exam.  Left vertebral artery patent to the basilar. Right vertebral artery not visualized and may be occluded in the neck. Basilar artery is patent. Fetal origin of the posterior cerebral artery bilaterally with hypoplastic distal basilar.  Internal carotid artery patent bilaterally. Anterior and middle cerebral arteries are degraded by significant motion but appear patent.  This study is not of adequate quality to exclude cerebral aneurysm.  IMPRESSION: Mild chronic microvascular ischemia.  No acute infarct  Limited MRA of the brain due to motion.  No large vessel occlusion.   Electronically Signed   By: Franchot Gallo M.D.   On: 11/06/2014 19:44    Cardiac Studies:  Assessment/Plan:  Status post TIA  rule out CVA Hypertension Diabetes mellitus History of seizure disorder History of hepatitis C Hyperlipidemia Plan Continue present management as per neurology OT PT Social  service for possible skilled nursing facility.  LOS: 1 day    Bani Gianfrancesco N 11/07/2014, 12:43 PM

## 2014-11-08 DIAGNOSIS — G40909 Epilepsy, unspecified, not intractable, without status epilepticus: Secondary | ICD-10-CM

## 2014-11-08 LAB — GLUCOSE, CAPILLARY
GLUCOSE-CAPILLARY: 92 mg/dL (ref 70–99)
GLUCOSE-CAPILLARY: 103 mg/dL — AB (ref 70–99)
Glucose-Capillary: 100 mg/dL — ABNORMAL HIGH (ref 70–99)
Glucose-Capillary: 101 mg/dL — ABNORMAL HIGH (ref 70–99)

## 2014-11-08 LAB — HEMOGLOBIN A1C
Hgb A1c MFr Bld: 6.1 % — ABNORMAL HIGH (ref 4.8–5.6)
Hgb A1c MFr Bld: 6.1 % — ABNORMAL HIGH (ref 4.8–5.6)
Mean Plasma Glucose: 128 mg/dL
Mean Plasma Glucose: 128 mg/dL

## 2014-11-08 MED ORDER — PERFLUTREN LIPID MICROSPHERE
1.0000 mL | INTRAVENOUS | Status: AC | PRN
  Filled 2014-11-08: qty 10

## 2014-11-08 MED ORDER — RISPERIDONE 0.5 MG PO TABS
0.5000 mg | ORAL_TABLET | Freq: Two times a day (BID) | ORAL | Status: DC
  Administered 2014-11-08 – 2014-11-09 (×2): 0.5 mg via ORAL
  Filled 2014-11-08 (×2): qty 1

## 2014-11-08 MED ORDER — RISPERIDONE 0.5 MG PO TABS: 0.2500 mg | ORAL_TABLET | Freq: Two times a day (BID) | ORAL | Status: DC

## 2014-11-08 MED ORDER — CLONAZEPAM 0.5 MG PO TABS
0.5000 mg | ORAL_TABLET | Freq: Three times a day (TID) | ORAL | Status: DC | PRN
  Administered 2014-11-08 – 2014-11-09 (×2): 0.5 mg via ORAL
  Filled 2014-11-08 (×2): qty 1

## 2014-11-08 NOTE — Progress Notes (Addendum)
STROKE TEAM PROGRESS NOTE   HISTORY Terry Baxter is an 69 y.o. male with a past medical history significant for HTN, DM, hepatitis C, and seizures, brought in via EMS as a code stroke due to acute onset of left face weakness, dysarthria, unsteadiness. As per EMS, patient was visiting some friends in her neighborhood and was noted to have left face weakness and slurred speech. EMS was summoned and he was unsteady on his feet. last known well 11/06/14, time unknown. Patient denies similar symptoms before but was seen in the ED few days ago after sustaining a fall. Denies associated HA, vertigo, double vision, focal weakness, or visual disturbances. NIHSS 4.  CT brain showed no acute abnormality. Patient was not administered TPA secondary to out of the window. He was admitted for further evaluation and treatment.   SUBJECTIVE (INTERVAL HISTORY) No family is at the bedside.  Overall he feels his condition is stable. Reports an episode on 10/17/13 with passing out. The current episode with bilateral LE weakness and left facial droop and slurry speech.  Denies LOC or seizure-like activity. Resolved in 2 hours. He had lower back surgery in the past.  No issues overnight.  OBJECTIVE Temp:  [98.1 F (36.7 C)-99.6 F (37.6 C)] 99.6 F (37.6 C) (04/13 1020) Pulse Rate:  [58-80] 72 (04/13 1020) Cardiac Rhythm:  [-] Normal sinus rhythm (04/12 1940) Resp:  [18] 18 (04/13 1020) BP: (124-153)/(75-90) 143/83 mmHg (04/13 1020) SpO2:  [96 %-99 %] 98 % (04/13 1020)   Recent Labs Lab 11/07/14 1238 11/07/14 1553 11/07/14 2128 11/08/14 0638 11/08/14 1109  GLUCAP 109* 103* 101* 100* 92    Recent Labs Lab 11/06/14 1713 11/06/14 1740  NA 142 139  K 4.3 3.9  CL 106 106  CO2  --  24  GLUCOSE 110* 108*  BUN 22 15  CREATININE 1.00 1.19  CALCIUM  --  8.9    Recent Labs Lab 11/06/14 1740  AST 16  ALT 10  ALKPHOS 73  BILITOT 0.5  PROT 7.0  ALBUMIN 3.5    Recent Labs Lab 11/06/14 1713  11/06/14 1740  WBC  --  10.8*  NEUTROABS  --  8.7*  HGB 15.3 13.3  HCT 45.0 40.4  MCV  --  94.2  PLT  --  146*   No results for input(s): CKTOTAL, CKMB, CKMBINDEX, TROPONINI in the last 168 hours.  Recent Labs  11/06/14 1740  LABPROT 13.8  INR 1.05    Recent Labs  11/07/14 0511  COLORURINE YELLOW  LABSPEC 1.016  PHURINE 6.5  GLUCOSEU NEGATIVE  HGBUR LARGE*  BILIRUBINUR NEGATIVE  KETONESUR NEGATIVE  PROTEINUR NEGATIVE  UROBILINOGEN 1.0  NITRITE NEGATIVE  LEUKOCYTESUR TRACE*       Component Value Date/Time   CHOL 193 11/07/2014 0707   TRIG 53 11/07/2014 0707   HDL 55 11/07/2014 0707   CHOLHDL 3.5 11/07/2014 0707   VLDL 11 11/07/2014 0707   LDLCALC 127* 11/07/2014 0707   Lab Results  Component Value Date   HGBA1C 6.1* 11/07/2014      Component Value Date/Time   LABOPIA POSITIVE* 11/07/2014 0511   COCAINSCRNUR NONE DETECTED 11/07/2014 0511   LABBENZ POSITIVE* 11/07/2014 0511   AMPHETMU NONE DETECTED 11/07/2014 0511   THCU POSITIVE* 11/07/2014 0511   LABBARB NONE DETECTED 11/07/2014 0511     Recent Labs Lab 11/06/14 1745  ETH <5   I have personally reviewed the radiological images below and agree with the radiology interpretations.  Ct Head Wo Contrast  11/06/2014   No CT findings of acute ischemia, or significant change from prior exam.    Mr Brain Wo Contrast 11/06/2014    Mild chronic microvascular ischemia.  No acute infarct    Mr Virgel Paling Wo Contrast 11/06/2014    Limited MRA of the brain due to motion.  No large vessel occlusion.     CUS - 1-39% stenosis bilaterally. Vertebral arteries are patent and antegrade bilaterally.  EEG - Normal awake electroencephalogram with activation procedures. There are no focal lateralizing or epileptiform features. Comment: An EEG with the patient sleep deprived to elicit drowse and light sleep may be desirable to further elicit a possible seizure disorder.  2D echo - pending  PHYSICAL EXAM  Temp:  [98.1  F (36.7 C)-99.6 F (37.6 C)] 99.6 F (37.6 C) (04/13 1020) Pulse Rate:  [58-80] 72 (04/13 1020) Resp:  [18] 18 (04/13 1020) BP: (124-153)/(75-90) 143/83 mmHg (04/13 1020) SpO2:  [96 %-99 %] 98 % (04/13 1020)  General - Well nourished, well developed, in no apparent distress.  Ophthalmologic - Sharp disc margins OU.  Cardiovascular - Regular rate and rhythm with no murmur.  Mental Status -  Level of arousal and orientation to time, place, and person were intact. Language including expression, naming, repetition, comprehension was assessed and found intact. Fund of Knowledge was assessed and was intact.  Cranial Nerves II - XII - II - Visual field intact OU. III, IV, VI - Extraocular movements intact. V - Facial sensation intact bilaterally. VII - Facial movement intact bilaterally. VIII - Hearing & vestibular intact bilaterally. X - Palate elevates symmetrically. XI - Chin turning & shoulder shrug intact bilaterally. XII - Tongue protrusion intact.  Motor Strength - The patient's strength was normal in all extremities and pronator drift was absent.  Bulk was normal and fasciculations were absent.   Motor Tone - Muscle tone was assessed at the neck and appendages and was normal.  Reflexes - The patient's reflexes were 1+ in all extremities and he had no pathological reflexes.  Sensory - Light touch, temperature/pinprick were assessed and were symmetrical.    Coordination - The patient had normal movements in the hands and feet with no ataxia or dysmetria.  Tremor was absent.  Gait and Station - deferred due to safety concerns.   ASSESSMENT/PLAN Terry Baxter is a 69 y.o. male with history of HTN, DM, hepatitis C, and seizures presenting with left face weakness, dysarthria, unsteadiness. He did not receive IV t-PA due to delay in arrival.   Possible TIA   Resultant  Neuro deficits resolved  MRI  No acute stroke  MRA  No large vessel occlusion  Carotid Doppler   unremarkable   2D Echo  pending   LDL 127, not at goal  HgbA1c 6.1, at the goal  .Heparin 5000 units sq tid for VTE prophylaxis Diet heart healthy/carb modified Room service appropriate?: Yes; Fluid consistency:: Thin  no antithrombotic prior to admission, now on aspirin 325 mg orally every day  Therapy recommendations:  HHOT, RW  Disposition:  Anticipate return home with therapies   Hypertension  Home meds:   None listed  Stable  Hyperlipidemia  Home meds:  No statin  LDL 127  New lipitor 40 mg daily  Continue statin at discharge  Other Stroke Risk Factors  UDS positive for opiates, benzos and THC. Recovering heroin addict.  Morbid Obesity, Body mass index is 40.3 kg/(m^2).   Seizures  On Keppra PTA  Typically no prodrome/aura  Continue Keppra  EEG negative for seizure  Other Active Problems  Hepatitis C  Hospital day # 2 Patient seen and discussed with [name]. If patient's echocardiogram is negative today patient is okay to be discharged home with follow-up with neurology, [name], in 2 months. No other neurology recommendations at this time.  [place] for Pager information 11/08/2014 12:45 PM   I, the attending vascular neurologist, have personally obtained a history, examined the patient, evaluated laboratory data, individually viewed imaging studies and agree with radiology interpretations. Together with the NP/PA, we formulated the assessment and plan of care which reflects our mutual decision.  I have made any additions or clarifications directly to the above note and agree with the findings and plan as currently documented.   69 yo M with PMH of HTN, DM, seizure on keppra, hepC was admitted for possible TIA episode. Symptoms resolved. Stroke w/u including MRI, MRA, CUS negative. LDL 127, put on lipitor. A1c 6.1. Continue ASA and keppra. 2D echo pending. Aggressive risk factor modification.    Neurology will sign off. Please call with questions. Pt will follow up with [name] at [facility] in about 2 months. Thanks for the consult.  Rosalin Hawking, MD PhD Stroke Neurology 11/08/2014 12:45 PM    To contact Stroke Continuity provider, please refer to [email or web address] After hours, contact General Neurology

## 2014-11-08 NOTE — Progress Notes (Signed)
  Echocardiogram 2D Echocardiogram has been performed.  Terry Baxter 11/08/2014, 4:51 PM

## 2014-11-08 NOTE — Clinical Social Work Note (Signed)
CSW has assessed patient at bedside. Full psychosocial assessment to follow.   Glendon Axe, MSW, LCSWA [phone] 11/08/2014 4:17 PM

## 2014-11-08 NOTE — Progress Notes (Signed)
OT NOTE  OT note to follow with further details:  Pt requesting SW to discuss placement. Pt completed MOCA score 21 demonstrating deficits with memory, visuospatial/ executive, language, and abstract thought. Pt could benefit from SNF with balance deficits and memory deficits. OT to change recommendations.   Jeri Modena   OTR/L Pager: [phone] Office: [phone] .

## 2014-11-08 NOTE — Progress Notes (Signed)
Physical Therapy Treatment Patient Details Name: Cheney Ewart MRN: [id number] DOB: [birth year] Today's Date: 11/08/2014    History of Present Illness 69 yo male presented with dysarthria, s/p fall , L facial droop and changes in gait. MRI (-) PMH: seizures, DM, Hep C    PT Comments    Patient seen for mobility progression this session. Patient higher level balance assessment performed, patient remains at high risk for falls (per DGI). Patient continues to demonstrates instability with activity and poor awareness and problem solving. Given fall history, current risk and decreased caregiver support, feel patient may benefit from [facility] upon acute discharge.   Follow Up Recommendations  SNF;Supervision/Assistance - 24 hour     Equipment Recommendations  None recommended by PT    Recommendations for Other Services       Precautions / Restrictions Precautions Precautions: Fall Precaution Comments: fall last week pushing trash can Restrictions Weight Bearing Restrictions: No    Mobility  Bed Mobility Overal bed mobility: Modified Independent                Transfers Overall transfer level: Needs assistance Equipment used: Rolling walker (2 wheeled) Transfers: Sit to/from Stand Sit to Stand: Min guard         General transfer comment: cues for hand placement  Ambulation/Gait Ambulation/Gait assistance: Min guard Ambulation Distance (Feet): 130 Feet Assistive device: Rolling walker (2 wheeled) (IV pole) Gait Pattern/deviations: Step-through pattern;Decreased stride length;Drifts right/left Gait velocity: decreased   General Gait Details: Visual deficits impacting mobility, but overall ambulating with no significant LOB noted   Stairs            Wheelchair Mobility    Modified Rankin (Stroke Patients Only) Modified Rankin (Stroke Patients Only) Pre-Morbid Rankin Score: Moderate disability Modified Rankin: Moderately severe disability     Balance              Standing balance-Leahy Scale: Fair                      Cognition Arousal/Alertness: Awake/alert Behavior During Therapy: Flat affect Overall Cognitive Status: Impaired/Different from baseline Area of Impairment: Memory;Safety/judgement     Memory: Decreased short-term memory   Safety/Judgement: Decreased awareness of safety     General Comments: patient continues to demonstrate some delays in processing time, patient also demonstrates poor awareness and problem solving (entered room, patient IV beeping and had been for several minutes but patient states he did not known what to do to get someone to come shut it off despite call bell positoned on patients lap).     Exercises      General Comments        Pertinent Vitals/Pain Pain Assessment: No/denies pain    Home Living                      Prior Function            PT Goals (current goals can now be found in the care plan section) Acute Rehab PT Goals Patient Stated Goal: none stated PT Goal Formulation: With patient Time For Goal Achievement: 11/21/14 Potential to Achieve Goals: Good Progress towards PT goals: Progressing toward goals    Frequency  Min 3X/week    PT Plan Discharge plan needs to be updated    Co-evaluation             End of Session Equipment Utilized During Treatment: Gait belt Activity Tolerance: Patient tolerated treatment well;No  increased pain Patient left: in bed;with call bell/phone within reach;with bed alarm set;with nursing/sitter in room     Time: 2800-3491 PT Time Calculation (min) (ACUTE ONLY): 19 min  Charges:  $Gait Training: 8-22 mins                    G CodesDuncan Dull Nov 19, 2014, 9:51 AM Alben Deeds, PT DPT  [phone]

## 2014-11-08 NOTE — Evaluation (Signed)
Speech Language Pathology Evaluation Patient Details Name: Terry Baxter MRN: [id number] DOB: Jan 29, 1946 Today's Date: 11/08/2014 Time: 6378-5885 SLP Time Calculation (min) (ACUTE ONLY): 40 min  Problem List:  Patient Active Problem List   Diagnosis Date Noted  . TIA (transient ischemic attack) 11/06/2014  . Syncope 10/19/2013  . Cannabis abuse 10/19/2013  . Seizure disorder 10/19/2013  . CKD (chronic kidney disease) stage 2, GFR 60-89 ml/min 10/19/2013  . Edema 05/05/2013  . Cardiomegaly 05/05/2013  . Hepatitis C    Past Medical History:  Past Medical History  Diagnosis Date  . Hepatitis C   . Diabetes mellitus   . Hypertension    Past Surgical History: History reviewed. No pertinent past surgical history. HPI:  69 yo male adm to [facility] with slurred speech, leg numbness, left facial droop and fall.  PMH + for seizures, DM, Hep C, pt reported methodone overruse requiring rehab.  MRI 4/11 negative.     Assessment / Plan / Recommendation Clinical Impression  Pt presents with mild dysarthria and cognitive deficits most notably impacting areas of attention and memory.  Note pt results on MOCA administered by OT.  Pt is intelligible but has rapid rate of speech with imprecise articulation.  He admits to premorbid dysarthria and memory deficits - that has worsened with this event.  Skilled SlP included providing compenstion strategies for dysarthria and memory with pt benefiting from moderate cues to implement.  He will benefit from skilled SLP to maximize functional cog ling and speech skills to allow maximal participation in his ADLs.  Advised pt use recorder on his cell phone to record himself to allow self modification to speech.  Demonstrated its use to pt.  Pt agreeable to plan.     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  Overall Cognitive Status: Impaired/Different from baseline (pt reports worsening memory deficits with this event) Arousal/Alertness: Awake/alert Orientation Level: Oriented X4 Attention: Sustained;Selective Sustained Attention: Impaired Sustained Attention Impairment: Verbal complex Selective Attention: Appears intact Memory: Impaired Memory Impairment: Storage deficit;Retrieval deficit;Decreased recall of new information Awareness: Appears intact (to memory, speech deficits) Problem Solving:  (for complex puzzle intact but pt admits to decr ability to do crossword puzzle in paper) Safety/Judgment: Appears intact (pt reports being willing to go for rehab)    Comprehension  Auditory Comprehension Overall Auditory Comprehension: Appears within functional limits for tasks assessed Yes/No Questions: Within Functional Limits Commands: Within Functional Limits Conversation: Complex Visual Recognition/Discrimination Discrimination: Within Function Limits Reading Comprehension Reading Status: Not tested    Expression Expression Primary Mode of Expression: Verbal Verbal Expression Overall Verbal Expression: Appears within functional limits for tasks assessed Initiation: No impairment Level of Generative/Spontaneous Verbalization: Conversation Repetition: No impairment Naming: Not tested Interfering Components: Speech intelligibility Written Expression Dominant Hand: Right Written Expression: Not tested   Oral / Motor Oral Motor/Sensory Function Overall Oral Motor/Sensory Function:  (tremorous movement and  decreased ROM) Motor Speech Overall Motor Speech: Impaired Respiration: Impaired Phonation: Low vocal intensity Resonance: Within functional limits Articulation: Impaired Level of Impairment: Phrase  Intelligibility: Intelligible Motor Planning: Witnin functional limits Motor Speech Errors: Not applicable Interfering Components: Inadequate dentition (upper dentition removed a year ago) Effective Techniques: Over-articulate;Increased vocal intensity   GO     Luanna Salk, [place] [facility] SLP [phone]

## 2014-11-08 NOTE — Progress Notes (Signed)
Occupational Therapy Treatment Patient Details Name: Terry Baxter MRN: [id number] DOB: January 27, 1946 Today's Date: 11/08/2014    History of present illness 69 yo male presented with dysarthria, s/p fall , L facial droop and changes in gait. MRI (-) PMH: seizures, DM, Hep C   OT comments  Pt could benefit from SNF upon d/c due to cognitive and balance deficits. Pt is agreeable to placement at this time. Ot recommendations updated.  Follow Up Recommendations  SNF    Equipment Recommendations  Other (comment)    Recommendations for Other Services      Precautions / Restrictions Precautions Precautions: Fall Precaution Comments: fall last week pushing trash can Restrictions Weight Bearing Restrictions: No       Mobility Bed Mobility Overal bed mobility: Modified Independent                Transfers Overall transfer level: Needs assistance Equipment used: Rolling walker (2 wheeled) Transfers: Sit to/from Stand Sit to Stand: Min guard         General transfer comment: cuse for hand placement    Balance             Standing balance-Leahy Scale: Fair                     ADL Overall ADL's : Needs assistance/impaired Eating/Feeding: Independent   Grooming: Wash/dry hands;Wash/dry face;Oral care;Modified independent                               Functional mobility during ADLs: Min guard;Rolling walker General ADL Comments: Pt completed bed mobility to chair and completed the [place]. Pt requesting to speak to SW regarding d/c to SNF. OT changing recommendation.       Vision                     Perception     Praxis      Cognition   Behavior During Therapy: Flat affect Overall Cognitive Status: Impaired/Different from baseline Area of Impairment: Memory;Safety/judgement     Memory: Decreased short-term memory    Safety/Judgement: Decreased awareness of safety     General Comments: MOCA provided and scoring 21 out of  30. Pt demonstrates memory, abstract, visuospacial deficits    Extremity/Trunk Assessment               Exercises     Shoulder Instructions       General Comments      Pertinent Vitals/ Pain       Pain Assessment: No/denies pain  Home Living     Available Help at Discharge: Friend(s);Available PRN/intermittently Type of Home: House                                  Prior Functioning/Environment              Frequency Min 2X/week     Progress Toward Goals  OT Goals(current goals can now be found in the care plan section)  Progress towards OT goals: Progressing toward goals  Acute Rehab OT Goals Patient Stated Goal: none stated OT Goal Formulation: With patient/family Time For Goal Achievement: 11/21/14 Potential to Achieve Goals: Good ADL Goals Pt Will Perform Lower Body Dressing: with modified independence;sit to/from stand Pt Will Transfer to Toilet: with modified independence;regular height toilet;ambulating Pt Will Perform Tub/Shower Transfer: Tub transfer;with supervision;ambulating;shower  seat;rolling walker Additional ADL Goal #1: Pt will complete the MOCA with score > 26   Plan Discharge plan remains appropriate    Co-evaluation                 End of Session Equipment Utilized During Treatment: Rolling walker   Activity Tolerance Patient tolerated treatment well   Patient Left in chair;with call bell/phone within reach   Nurse Communication Mobility status;Precautions        Time: 0730-0800 OT Time Calculation (min): 30 min  Charges: OT General Charges $OT Visit: 1 Procedure OT Treatments $Cognitive Skills Development: 23-37 mins  Peri Maris 11/08/2014, 10:37 AM  Pager: [phone]

## 2014-11-08 NOTE — Progress Notes (Signed)
Subjective:  Denies any chest pain or shortness of breath. Neuro workup in progress 2-D echo still pending.  Objective:  Vital Signs in the last 24 hours: Temp:  [98.1 F (36.7 C)-98.8 F (37.1 C)] 98.2 F (36.8 C) (04/13 7353) Pulse Rate:  [58-80] 59 (04/13 0614) Resp:  [18] 18 (04/13 0614) BP: (124-153)/(75-90) 149/87 mmHg (04/13 0614) SpO2:  [96 %-99 %] 99 % (04/13 0614)  Intake/Output from previous day: 04/12 0701 - 04/13 0700 In: 660 [P.O.:660] Out: 1350 [Urine:1350] Intake/Output from this shift: Total I/O In: -  Out: 200 [Urine:200]  Physical Exam: Neck: no adenopathy, no carotid bruit, no JVD and supple, symmetrical, trachea midline Lungs: clear to auscultation bilaterally Heart: regular rate and rhythm, S1, S2 normal and Soft systolic murmur noted Abdomen: soft, non-tender; bowel sounds normal; no masses,  no organomegaly Extremities: extremities normal, atraumatic, no cyanosis or edema  Lab Results:  Recent Labs  11/06/14 1713 11/06/14 1740  WBC  --  10.8*  HGB 15.3 13.3  PLT  --  146*    Recent Labs  11/06/14 1713 11/06/14 1740  NA 142 139  K 4.3 3.9  CL 106 106  CO2  --  24  GLUCOSE 110* 108*  BUN 22 15  CREATININE 1.00 1.19   No results for input(s): TROPONINI in the last 72 hours.  Invalid input(s): CK, MB Hepatic Function Panel  Recent Labs  11/06/14 1740  PROT 7.0  ALBUMIN 3.5  AST 16  ALT 10  ALKPHOS 73  BILITOT 0.5    Recent Labs  11/07/14 0707  CHOL 193   No results for input(s): PROTIME in the last 72 hours.  Imaging: Imaging results have been reviewed and Ct Head Wo Contrast  11/06/2014   CLINICAL DATA:  Code stroke.  Left-sided weakness.  Aphasia.  EXAM: CT HEAD WITHOUT CONTRAST  TECHNIQUE: Contiguous axial images were obtained from the base of the skull through the vertex without intravenous contrast.  COMPARISON:  Head CT 11/02/2014  FINDINGS: No intracranial hemorrhage, mass effect, or midline shift. No evidence of  territorial infarct. Stable chronic small vessel ischemia. No hydrocephalus. The basilar cisterns are patent. No intracranial fluid collection. Calvarium is intact. Included paranasal sinuses and mastoid air cells are well aerated.  IMPRESSION: No CT findings of acute ischemia, or significant change from prior exam.  These results were called by telephone at the time of interpretation on 11/06/2014 at 5:30 pm to Dr. Aram Beecham , who verbally acknowledged these results.   Electronically Signed   By: Jeb Levering M.D.   On: 11/06/2014 17:31   Mr Jodene Nam Head Wo Contrast  11/06/2014   CLINICAL DATA:  Aphasia.  Left facial droop.  Stroke.  EXAM: MRI HEAD WITHOUT CONTRAST  MRA HEAD WITHOUT CONTRAST  TECHNIQUE: Multiplanar, multiecho pulse sequences of the brain and surrounding structures were obtained without intravenous contrast. Angiographic images of the head were obtained using MRA technique without contrast.  COMPARISON:  CT head 11/06/2014  FINDINGS: MRI HEAD FINDINGS  Negative for acute infarct  Mild atrophy.  Negative for hydrocephalus.  Mild chronic microvascular ischemic change in the white matter. Brainstem and cerebellum intact.  Pituitary normal in size. Craniocervical junction normal. Paranasal sinuses clear.  Negative for intracranial hemorrhage.  Negative for mass or edema.  No shift of the midline structures.  MRA HEAD FINDINGS  Image quality degraded by moderate motion.  This limits the exam.  Left vertebral artery patent to the basilar. Right vertebral artery not visualized  and may be occluded in the neck. Basilar artery is patent. Fetal origin of the posterior cerebral artery bilaterally with hypoplastic distal basilar.  Internal carotid artery patent bilaterally. Anterior and middle cerebral arteries are degraded by significant motion but appear patent.  This study is not of adequate quality to exclude cerebral aneurysm.  IMPRESSION: Mild chronic microvascular ischemia.  No acute infarct  Limited MRA  of the brain due to motion.  No large vessel occlusion.   Electronically Signed   By: Franchot Gallo M.D.   On: 11/06/2014 19:44   Mr Brain Wo Contrast  11/06/2014   CLINICAL DATA:  Aphasia.  Left facial droop.  Stroke.  EXAM: MRI HEAD WITHOUT CONTRAST  MRA HEAD WITHOUT CONTRAST  TECHNIQUE: Multiplanar, multiecho pulse sequences of the brain and surrounding structures were obtained without intravenous contrast. Angiographic images of the head were obtained using MRA technique without contrast.  COMPARISON:  CT head 11/06/2014  FINDINGS: MRI HEAD FINDINGS  Negative for acute infarct  Mild atrophy.  Negative for hydrocephalus.  Mild chronic microvascular ischemic change in the white matter. Brainstem and cerebellum intact.  Pituitary normal in size. Craniocervical junction normal. Paranasal sinuses clear.  Negative for intracranial hemorrhage.  Negative for mass or edema.  No shift of the midline structures.  MRA HEAD FINDINGS  Image quality degraded by moderate motion.  This limits the exam.  Left vertebral artery patent to the basilar. Right vertebral artery not visualized and may be occluded in the neck. Basilar artery is patent. Fetal origin of the posterior cerebral artery bilaterally with hypoplastic distal basilar.  Internal carotid artery patent bilaterally. Anterior and middle cerebral arteries are degraded by significant motion but appear patent.  This study is not of adequate quality to exclude cerebral aneurysm.  IMPRESSION: Mild chronic microvascular ischemia.  No acute infarct  Limited MRA of the brain due to motion.  No large vessel occlusion.   Electronically Signed   By: Franchot Gallo M.D.   On: 11/06/2014 19:44    Cardiac Studies:  Assessment/Plan:  Status post TIA Hypertension Diabetes mellitus History of seizure disorder History of hepatitis C Hyperlipidemia Anxiety disorder Plan Continue present management Check 2-D echo Increase ambulation   LOS: 2 days    Terry Baxter  N 11/08/2014, 11:42 AM

## 2014-11-09 LAB — GLUCOSE, CAPILLARY
GLUCOSE-CAPILLARY: 97 mg/dL (ref 70–99)
Glucose-Capillary: 100 mg/dL — ABNORMAL HIGH (ref 70–99)
Glucose-Capillary: 96 mg/dL (ref 70–99)

## 2014-11-09 MED ORDER — SENNOSIDES-DOCUSATE SODIUM 8.6-50 MG PO TABS: 1.0000 | ORAL_TABLET | Freq: Every evening | ORAL | Status: DC | PRN

## 2014-11-09 MED ORDER — ATORVASTATIN CALCIUM 40 MG PO TABS: 40.0000 mg | ORAL_TABLET | Freq: Every day | ORAL | Status: DC

## 2014-11-09 MED ORDER — ASPIRIN 325 MG PO TABS: 325.0000 mg | ORAL_TABLET | Freq: Every day | ORAL | Status: DC

## 2014-11-09 MED ORDER — RISPERIDONE 0.5 MG PO TABS: 0.5000 mg | ORAL_TABLET | Freq: Two times a day (BID) | ORAL | Status: DC

## 2014-11-09 MED ORDER — CLONAZEPAM 0.5 MG PO TABS: 0.5000 mg | ORAL_TABLET | Freq: Two times a day (BID) | ORAL | Status: DC | PRN

## 2014-11-09 MED ORDER — QUETIAPINE FUMARATE 100 MG PO TABS: 100.0000 mg | ORAL_TABLET | Freq: Every day | ORAL | Status: DC

## 2014-11-09 NOTE — Clinical Social Work Note (Signed)
Clinical Social Worker facilitated patient discharge including contacting patient family and facility to confirm patient discharge plans.  Clinical information faxed to facility and family agreeable with plan.  CSW to arrange ambulance transport via [facility] to [facility].  RN to call report prior to discharge.  CSW notified MD of prescription requiring signature. CSW awaiting discharge summary to be uploaded into EPIC.  DC packet on chart for transport.   Clinical Social Worker will sign off for now as social work intervention is no longer needed. Please consult [place] again if new need arises.  Glendon Axe, MSW, LCSWA [phone] 11/09/2014 3:12 PM

## 2014-11-09 NOTE — Clinical Social Work Placement (Signed)
Clinical Social Work Department CLINICAL SOCIAL WORK PLACEMENT NOTE 11/09/2014  Patient:  Terry Baxter, Terry Baxter  Account Number:  [id number] [place] date:  11/06/2014  Clinical Social Worker:  Glendon Axe, CLINICAL SOCIAL WORKER  Date/time:  11/09/2014 03:10 PM  Clinical Social Work is seeking post-discharge placement for this patient at the following level of care:   SKILLED NURSING   (*CSW will update this form in Epic as items are completed)   11/08/2014  Patient/family provided with [place] Department of Clinical Social Work's list of facilities offering this level of care within the geographic area requested by the patient (or if unable, by the patient's family).  11/08/2014  Patient/family informed of their freedom to choose among providers that offer the needed level of care, that participate in Medicare, Medicaid or managed care program needed by the patient, have an available bed and are willing to accept the patient.  11/08/2014  Patient/family informed of [facility]' ownership interest in [facility], as well as of the fact that they are under no obligation to receive care at this facility.  PASARR submitted to EDS on 11/08/2014 PASARR number received on 11/08/2014  FL2 transmitted to all facilities in geographic area requested by pt/family on  11/08/2014 FL2 transmitted to all facilities within larger geographic area on   Patient informed that his/her managed care company has contracts with or will negotiate with  certain facilities, including the following:   YES     Patient/family informed of bed offers received:  11/09/2014 Patient chooses bed at [facility] Physician recommends and patient chooses bed at    Patient to be transferred to [facility] on  11/09/2014 Patient to be transferred to facility by [facility] Patient and family notified of transfer on 11/09/2014 Name of family member notified:  Pt's brother, Beverly Milch  The following physician request were entered in Epic:   Additional Comments:   Glendon Axe, MSW, [place] [phone] 11/09/2014 3:12 PM

## 2014-11-09 NOTE — Discharge Summary (Signed)
Terry Baxter, Terry Baxter               ACCOUNT NO.:  [id number]  MEDICAL RECORD NO.:  [id number]  LOCATION:  4N08C                        FACILITY:  [facility]  PHYSICIAN:  Allegra Lai. Terrence Dupont, M.D. DATE OF BIRTH:  Mar 10, 1946  DATE OF ADMISSION:  11/06/2014 DATE OF DISCHARGE:  11/09/2014                              DISCHARGE SUMMARY   ADMITTING DIAGNOSES: 1. Status post transient ischemic attack, rule out cerebrovascular     accident. 2. Hypertension. 3. Diabetes mellitus. 4. History of seizure disorder. 5. History of hepatitis C. 6. Depression. 7. Anxiety disorder.  FINAL DIAGNOSES: 1. Status post transient ischemic attack, small vessel disease. 2. Hypertension, controlled by diet. 3. Diabetes mellitus, controlled by diet. 4. History of seizure disorder. 5. History of hepatitis C. 6. Hyperlipidemia. 7. Anxiety disorder. 8. Depression.  MEDICATIONS:  Discharge home medications are: 1. Aspirin 325 mg 1 tablet daily. 2. Atorvastatin 40 mg daily. 3. Senokot 1 tab date at bedtime for constipation as needed. 4. BuSpar 10 mg twice daily as before. 5. Neurontin 600 mg daily as before. 6. Keppra 500 mg twice daily as before. 7. Clonazepam has been reduced to 0.5 mg twice daily. 8. Seroquel is reduced to 100 mg daily at bedtime. 9. Risperidone is reduced to 0.5 mg twice daily. 10.The patient has been advised to stop hydrocodone and acetaminophen.  CONDITION AT DISCHARGE:  Stable.  FOLLOWUP:  Follow up with [name] in 2 weeks.  The patient will be transferred to skilled nursing facility/rehab.  BRIEF HISTORY AND HOSPITAL COURSE:  Terry Baxter is a 69 year old male with past medical history significant for hypertension; diabetes mellitus, controlled by diet; history of seizure disorder; history of hepatitis C came to the ER by EMS as stroke.  Code stroke was called because of left facial weakness and  dysarthria.  The patient denies any weakness in the arms or legs, but states had  unsteady gait and felt generalized weakness.  The patient had CT of the brain and MRI of the brain which showed small vessel disease; no acute ischemic changes were noted.  The patient presently denies any complaints.  The patient was seen by Neuro and was felt not to be candidate for tPA as he was out of window time.  PAST MEDICAL HISTORY:  As above.  PHYSICAL EXAMINATION:  VITAL SIGNS:  When seen in the ED; his blood pressure was 124/79, pulse 89.  He was afebrile. HEENT:  Conjunctivae were pink. NECK:  Supple.  No JVD. LUNGS:  Clear to auscultation without rhonchi or rales. CARDIOVASCULAR:  S1, S2 was normal.  There was soft systolic murmur.  No S3 gallop. ABDOMEN:  Soft.  Bowel sounds were present.  Nontender. EXTREMITIES:  There is no clubbing, cyanosis, or edema. NEURO:  He was alert, oriented. Occasional episodes of drowsiness, no focal deficit noted.  LABORATORY DATA:  Sodium 142, potassium 4.3, BUN 22, creatinine 1.0, blood sugar was 110.  Hemoglobin was 15.3, hematocrit 45.  CT of the brain showed no acute intracranial abnormalities, minimal small-vessel chronic ischemic changes of deep white cerebral matter, no acute facial bone abnormalities, multiple degenerative disk and facet disease changes of the C-spine, no acute cervical spine abnormalities. MRI  of the brain showed negative for acute infarct, mild atrophy; negative hydrocephalus, mild chronic microvascular ischemic changes in the white matter of the brain stem and cerebellum; pituitary size was normal; negative for intracranial hemorrhage.  There was no mass or edema, no shift of the midline structures.  A 2D echo showed mildly depressed LV systolic function, otherwise no significant findings,  No evidence of cardioembolic CVA.  EKG showed normal sinus rhythm, possible left atrial enlargement, nonspecific T-wave changes.  BRIEF HOSPITAL COURSE:  The patient was admitted to telemetry unit. Neurology consultation  was called.  The patient's swallowing evaluation was normal.  OT/PT consultation was called.  The patient has been ambulating in the room without any problems.  Discussed with the patient regarding lifestyle changes, diet, etc.  The patient will be transferred to short-term rehab and then will be followed up by [name] and Neuro as outpatient as scheduled.  The patient did not have any further episodes of weakness or slurred speech during the hospital stay.  The patient appears to be stable to be discharged home.  The patient will be followed up by Neuro and [name] as outpatient as scheduled.     Allegra Lai. Terrence Dupont, M.D.     MNH/MEDQ  D:  11/09/2014  T:  11/09/2014  Job:  [id number]

## 2014-11-09 NOTE — Discharge Instructions (Signed)
Transient Ischemic Attack  A transient ischemic attack (TIA) is a "warning stroke" that causes stroke-like symptoms. Unlike a stroke, a TIA does not cause permanent damage to the brain. The symptoms of a TIA can happen very fast and do not last long. It is important to know the symptoms of a TIA and what to do. This can help prevent a major stroke or death.  CAUSES   · A TIA is caused by a temporary blockage in an artery in the brain or neck (carotid artery). The blockage does not allow the brain to get the blood supply it needs and can cause different symptoms. The blockage can be caused by either:  ¨ A blood clot.  ¨ Fatty buildup (plaque) in a neck or brain artery.  RISK FACTORS  · High blood pressure (hypertension).  · High cholesterol.  · Diabetes mellitus.  · Heart disease.  · The build up of plaque in the blood vessels (peripheral artery disease or atherosclerosis).  · The build up of plaque in the blood vessels providing blood and oxygen to the brain (carotid artery stenosis).  · An abnormal heart rhythm (atrial fibrillation).  · Obesity.  · Smoking.  · Taking oral contraceptives (especially in combination with smoking).  · Physical inactivity.  · A diet high in fats, salt (sodium), and calories.  · Alcohol use.  · Use of illegal drugs (especially cocaine and methamphetamine).  · Being male.  · Being African American.  · Being over the age of 55.  · Family history of stroke.  · Previous history of blood clots, stroke, TIA, or heart attack.  · Sickle cell disease.  SYMPTOMS   TIA symptoms are the same as a stroke but are temporary. These symptoms usually develop suddenly, or may be newly present upon awakening from sleep:  · Sudden weakness or numbness of the face, arm, or leg, especially on one side of the body.  · Sudden trouble walking or difficulty moving arms or legs.  · Sudden confusion.  · Sudden personality changes.  · Trouble speaking (aphasia) or understanding.  · Difficulty swallowing.  · Sudden  trouble seeing in one or both eyes.  · Double vision.  · Dizziness.  · Loss of balance or coordination.  · Sudden severe headache with no known cause.  · Trouble reading or writing.  · Loss of bowel or bladder control.  · Loss of consciousness.  DIAGNOSIS   Your caregiver may be able to determine the presence or absence of a TIA based on your symptoms, history, and physical exam. Computed tomography (CT scan) of the brain is usually performed to help identify a TIA. Other tests may be done to diagnose a TIA. These tests may include:  · Electrocardiography.  · Continuous heart monitoring.  · Echocardiography.  · Carotid ultrasonography.  · Magnetic resonance imaging (MRI).  · A scan of the brain circulation.  · Blood tests.  PREVENTION   The risk of a TIA can be decreased by appropriately treating high blood pressure, high cholesterol, diabetes, heart disease, and obesity and by quitting smoking, limiting alcohol, and staying physically active.  TREATMENT   Time is of the essence. Since the symptoms of TIA are the same as a stroke, it is important to seek treatment as soon as possible because you may need a medicine to dissolve the clot (thrombolytic) that cannot be given if too much time has passed. Treatment options vary. Treatment options may include rest, oxygen, intravenous (IV) fluids,   and medicines to thin the blood (anticoagulants). Medicines and diet may be used to address diabetes, high blood pressure, and other risk factors. Measures will be taken to prevent short-term and long-term complications, including infection from breathing foreign material into the lungs (aspiration pneumonia), blood clots in the legs, and falls. Treatment options include procedures to either remove plaque in the carotid arteries or dilate carotid arteries that have narrowed due to plaque. Those procedures are:  · Carotid endarterectomy.  · Carotid angioplasty and stenting.  HOME CARE INSTRUCTIONS   · Take all medicines prescribed  by your caregiver. Follow the directions carefully. Medicines may be used to control risk factors for a stroke. Be sure you understand all your medicine instructions.  · You may be told to take aspirin or the anticoagulant warfarin. Warfarin needs to be taken exactly as instructed.  ¨ Taking too much or too little warfarin is dangerous. Too much warfarin increases the risk of bleeding. Too little warfarin continues to allow the risk for blood clots. While taking warfarin, you will need to have regular blood tests to measure your blood clotting time. A PT blood test measures how long it takes for blood to clot. Your PT is used to calculate another value called an INR. Your PT and INR help your caregiver to adjust your dose of warfarin. The dose can change for many reasons. It is critically important that you take warfarin exactly as prescribed.  ¨ Many foods, especially foods high in vitamin K can interfere with warfarin and affect the PT and INR. Foods high in vitamin K include spinach, kale, broccoli, cabbage, collard and turnip greens, brussels sprouts, peas, cauliflower, seaweed, and parsley as well as beef and pork liver, green tea, and soybean oil. You should eat a consistent amount of foods high in vitamin K. Avoid major changes in your diet, or notify your caregiver before changing your diet. Arrange a visit with a dietitian to answer your questions.  ¨ Many medicines can interfere with warfarin and affect the PT and INR. You must tell your caregiver about any and all medicines you take, this includes all vitamins and supplements. Be especially cautious with aspirin and anti-inflammatory medicines. Do not take or discontinue any prescribed or over-the-counter medicine except on the advice of your caregiver or pharmacist.  ¨ Warfarin can have side effects, such as excessive bruising or bleeding. You will need to hold pressure over cuts for longer than usual. Your caregiver or pharmacist will discuss other  potential side effects.  ¨ Avoid sports or activities that may cause injury or bleeding.  ¨ Be mindful when shaving, flossing your teeth, or handling sharp objects.  ¨ Alcohol can change the body's ability to handle warfarin. It is best to avoid alcoholic drinks or consume only very small amounts while taking warfarin. Notify your caregiver if you change your alcohol intake.  ¨ Notify your dentist or other caregivers before procedures.  · Eat a diet that includes 5 or more servings of fruits and vegetables each day. This may reduce the risk of stroke. Certain diets may be prescribed to address high blood pressure, high cholesterol, diabetes, or obesity.  ¨ A low-sodium, low-saturated fat, low-trans fat, low-cholesterol diet is recommended to manage high blood pressure.  ¨ A low-saturated fat, low-trans fat, low-cholesterol, and high-fiber diet may control cholesterol levels.  ¨ A controlled-carbohydrate, controlled-sugar diet is recommended to manage diabetes.  ¨ A reduced-calorie, low-sodium, low-saturated fat, low-trans fat, low-cholesterol diet is recommended to manage obesity.  ·   Maintain a healthy weight.  · Stay physically active. It is recommended that you get at least 30 minutes of activity on most or all days.  · Do not smoke.  · Limit alcohol use even if you are not taking warfarin. Moderate alcohol use is considered to be:  ¨ No more than 2 drinks each day for men.  ¨ No more than 1 drink each day for nonpregnant women.  · Stop drug abuse.  · Home safety. A safe home environment is important to reduce the risk of falls. Your caregiver may arrange for specialists to evaluate your home. Having grab bars in the bedroom and bathroom is often important. Your caregiver may arrange for equipment to be used at home, such as raised toilets and a seat for the shower.  · Follow all instructions for follow-up with your caregiver. This is very important. This includes any referrals and lab tests. Proper follow up can  prevent a stroke or another TIA from occurring.  SEEK MEDICAL CARE IF:  · You have personality changes.  · You have difficulty swallowing.  · You are seeing double.  · You have dizziness.  · You have a fever.  · You have skin breakdown.  SEEK IMMEDIATE MEDICAL CARE IF:   Any of these symptoms may represent a serious problem that is an emergency. Do not wait to see if the symptoms will go away. Get medical help right away. Call your local emergency services (911 in [place] Do not drive yourself to the hospital.  · You have sudden weakness or numbness of the face, arm, or leg, especially on one side of the body.  · You have sudden trouble walking or difficulty moving arms or legs.  · You have sudden confusion.  · You have trouble speaking (aphasia) or understanding.  · You have sudden trouble seeing in one or both eyes.  · You have a loss of balance or coordination.  · You have a sudden, severe headache with no known cause.  · You have new chest pain or an irregular heartbeat.  · You have a partial or total loss of consciousness.  MAKE SURE YOU:   · Understand these instructions.  · Will watch your condition.  · Will get help right away if you are not doing well or get worse.  Document Released: 04/23/2005 Document Revised: 07/19/2013 Document Reviewed: 10/19/2013  Exit[employer]® Patient Information ©2015 [employer]. This information is not intended to replace advice given to you by your health care provider. Make sure you discuss any questions you have with your health care provider.

## 2014-11-09 NOTE — Clinical Social Work Psychosocial (Signed)
Clinical Social Work Department BRIEF PSYCHOSOCIAL ASSESSMENT 11/09/2014  Patient:  Terry Baxter, Terry Baxter     Account Number:  [id number]     Admit date:  11/06/2014  Clinical Social Worker:  Glendon Axe, CLINICAL SOCIAL WORKER  Date/Time:  11/09/2014 02:37 PM  Referred by:  Physician  Date Referred:  11/09/2014 Referred for  SNF Placement   Other Referral:   Interview type:  Patient Other interview type:   CSW met with patient at bedside.    PSYCHOSOCIAL DATA Living Status:  ALONE Admitted from facility:  n/a Level of care:  n/a Primary support name:  Terry Baxter Primary support relationship to patient:  SIBLING Degree of support available:   Good    CURRENT CONCERNS Current Concerns  Post-Acute Placement   Other Concerns:    SOCIAL WORK ASSESSMENT / PLAN Clinical Social Worker met with patient at bedside in reference to post-acute placement for SNF. CSW introduced CSW role and SNF process. CSW also reviewed and provided SNF list. Pt reported he lives home alone however has family in the area. Pt stated he is agreeable to SNF placement and would like to include his brother, Terry Baxter to assist him in decision making. CSW will continue to follow pt and pt's family for continued support.   Assessment/plan status:  Psychosocial Support/Ongoing Assessment of Needs Other assessment/ plan:   Information/referral to community resources:   SNF information/ list provided.    PATIENT'S/FAMILY'S RESPONSE TO PLAN OF CARE: Pt alert and oriented X4. Pt agreeable to SNF placement with brother's consent. Pt's brother agreeable to placement as well. Pt and pt's brother pleasant and appreciated social work intervention.    Glendon Axe, MSW, LCSWA [phone] 11/09/2014 3:09 PM

## 2014-11-09 NOTE — Discharge Summary (Signed)
[facility] discharge summary dictated on 11/09/2014 dictation number is [phone]

## 2014-11-09 NOTE — Progress Notes (Signed)
CARE MANAGEMENT NOTE 11/09/2014  Patient:  Terry Baxter, Terry Baxter   Account Number:  [id number]  Date Initiated:  11/07/2014  Documentation initiated by:  Lorne Skeens  Subjective/Objective Assessment:   Patient was admitted for stroke work-up.  Lives at home with spouse.     Action/Plan:   Will follow for discharge needs pending PT/OT evals and physician orders.   Anticipated DC Date:  11/08/2014   Anticipated DC Plan:  [place]         Choice offered to / List presented to:             Status of service:  Completed, signed off Medicare Important Message given?  YES (If response is "NO", the following Medicare IM given date fields will be blank) Date Medicare IM given:  11/09/2014 Medicare IM given by:  Lorne Skeens Date Additional Medicare IM given:   Additional Medicare IM given by:    Discharge Disposition:  [place]  Per UR Regulation:  Reviewed for med. necessity/level of care/duration of stay  If discussed at [place] of Stay Meetings, dates discussed:    Comments:  11/09/14  Lorne Skeens RN, MSN, CM- Medicare IM letter provided.

## 2014-11-10 ENCOUNTER — Non-Acute Institutional Stay (SKILLED_NURSING_FACILITY): Payer: Medicare Other | Admitting: Internal Medicine

## 2014-11-10 ENCOUNTER — Encounter: Payer: Self-pay | Admitting: Internal Medicine

## 2014-11-10 DIAGNOSIS — F3130 Bipolar disorder, current episode depressed, mild or moderate severity, unspecified: Secondary | ICD-10-CM

## 2014-11-10 DIAGNOSIS — E1340 Other specified diabetes mellitus with diabetic neuropathy, unspecified: Secondary | ICD-10-CM | POA: Diagnosis not present

## 2014-11-10 DIAGNOSIS — F319 Bipolar disorder, unspecified: Secondary | ICD-10-CM | POA: Insufficient documentation

## 2014-11-10 DIAGNOSIS — G451 Carotid artery syndrome (hemispheric): Secondary | ICD-10-CM

## 2014-11-10 DIAGNOSIS — B1920 Unspecified viral hepatitis C without hepatic coma: Secondary | ICD-10-CM | POA: Diagnosis not present

## 2014-11-10 DIAGNOSIS — F1210 Cannabis abuse, uncomplicated: Secondary | ICD-10-CM | POA: Diagnosis not present

## 2014-11-10 DIAGNOSIS — F1110 Opioid abuse, uncomplicated: Secondary | ICD-10-CM

## 2014-11-10 DIAGNOSIS — F1111 Opioid abuse, in remission: Secondary | ICD-10-CM

## 2014-11-10 MED FILL — Perflutren Lipid Microsphere IV Susp 1.1 MG/ML: INTRAVENOUS | Qty: 10 | Status: AC

## 2014-11-10 NOTE — Assessment & Plan Note (Signed)
Pt presented with left facial weakness and dysarthria. The patient denies any weakness in the arms or legs, but states had unsteady gait and felt generalized weakness. The patient had CT of the brain and MRI of the brain which showed small vessel disease; no acute ischemic changes were noted. The patient presently without sx.Swallowing study normal

## 2014-11-10 NOTE — Assessment & Plan Note (Signed)
Being tx with risperdal and seroquel;Plan- continue home meds

## 2014-11-10 NOTE — Assessment & Plan Note (Signed)
UDS+ for marijuana

## 2014-11-10 NOTE — Assessment & Plan Note (Addendum)
Pt says he was treated with 2 meds for months for this in [place] and it is cured;not receiving any treatment currently; I make the assumption that more info is needed from other sources

## 2014-11-10 NOTE — Assessment & Plan Note (Signed)
Pt's drug screen was positive for opiates on current hospitalization admission, yet d/c summary says pt has been advised to stop hydrocodone; per nursing brother has expressed concern that pt is taking things he shouldn't; no narcotics are written for him here, even though he says he goes to a pain clinic, he doesn't appear to be in any discomfort without them; as a former heroin abuser, any narcotic (even marijuana) is a slippery slope for him

## 2014-11-10 NOTE — Progress Notes (Signed)
MRN: [id number] Name: Terry Baxter  Sex: male Age: 69 y.o. DOB: 12-Apr-1946  [place] [facility]: [facility] Facility/Room:108 Level Of Care: SNF Provider: Inocencio Homes D Emergency Contacts: Extended Emergency Contact Information Primary Emergency Contact: Terry Baxter Address: [place], [place] Phone: [phone] Relation: Friend  Code Status:   Allergies: Review of patient's allergies indicates no known allergies.  Chief Complaint  Patient presents with  . New Admit To SNF    HPI: Patient is 69 y.o. male who had a TIA who is admitted to SNF for rehab.  Past Medical History  Diagnosis Date  . Hepatitis C   . Diabetes mellitus   . Hypertension     History reviewed. No pertinent past surgical history.    Medication List       This list is accurate as of: 11/10/14 10:13 PM.  Always use your most recent med list.               aspirin 325 MG tablet  Take 1 tablet (325 mg total) by mouth daily.     atorvastatin 40 MG tablet  Commonly known as:  LIPITOR  Take 1 tablet (40 mg total) by mouth daily at 6 PM.     busPIRone 10 MG tablet  Commonly known as:  BUSPAR  Take 10 mg by mouth 2 (two) times daily.     clonazePAM 0.5 MG tablet  Commonly known as:  KLONOPIN  Take 1 tablet (0.5 mg total) by mouth 2 (two) times daily as needed for anxiety.     gabapentin 600 MG tablet  Commonly known as:  NEURONTIN  Take 600 mg by mouth daily.     levETIRAcetam 500 MG tablet  Commonly known as:  KEPPRA  Take 500 mg by mouth 2 (two) times daily.     QUEtiapine 100 MG tablet  Commonly known as:  SEROQUEL  Take 1 tablet (100 mg total) by mouth at bedtime.     risperiDONE 0.5 MG tablet  Commonly known as:  RISPERDAL  Take 1 tablet (0.5 mg total) by mouth 2 (two) times daily.     senna-docusate 8.6-50 MG per tablet  Commonly known as:  Senokot-S  Take 1 tablet by mouth at bedtime as needed for mild constipation.        No  orders of the defined types were placed in this encounter.     There is no immunization history on file for this patient.  History  Substance Use Topics  . Smoking status: Never Smoker   . Smokeless tobacco: Not on file  . Alcohol Use: No    Family history is noncontributory    Review of Systems  DATA OBTAINED: from patient, nurse, medical record, family member GENERAL:  no fevers, fatigue, appetite changes SKIN: No itching, rash or wounds EYES: No eye pain, redness, discharge EARS: No earache, tinnitus, change in hearing NOSE: No congestion, drainage or bleeding  MOUTH/THROAT: No mouth or tooth pain, No sore throat RESPIRATORY: No cough, wheezing, SOB CARDIAC: No chest pain, palpitations, lower extremity edema  GI: No abdominal pain, No N/V/D or constipation, No heartburn or reflux  GU: No dysuria, frequency or urgency, or incontinence  MUSCULOSKELETAL: No unrelieved bone/joint pain NEUROLOGIC: No headache, dizziness or focal weakness PSYCHIATRIC: No overt anxiety or sadness, No behavior issue.   There were no vitals filed for this visit.  Physical Exam  GENERAL APPEARANCE: Alert,mod conversant,  No acute distress.  SKIN: No diaphoresis rash HEAD: Normocephalic, atraumatic  EYES: Conjunctiva/lids clear. Pupils round, reactive. EOMs intact.  EARS: External exam WNL, canals clear. Hearing grossly normal.  NOSE: No deformity or discharge.  MOUTH/THROAT: Lips w/o lesions  RESPIRATORY: Breathing is even, unlabored. Lung sounds are clear   CARDIOVASCULAR: Heart RRR no murmurs, rubs or gallops. No peripheral edema.   GASTROINTESTINAL: Abdomen is soft, non-tender, not distended w/ normal bowel sounds. GENITOURINARY: Bladder non tender, not distended  MUSCULOSKELETAL: No abnormal joints or musculature NEUROLOGIC:  Cranial nerves 2-12 grossly intact. Moves all extremities  PSYCHIATRIC: pt appears to have some dementia, no behavioral issues  Patient Active Problem List    Diagnosis Date Noted  . Bipolar disorder 11/10/2014  . History of opioid abuse 11/10/2014  . Neuropathy due to secondary diabetes mellitus 11/10/2014  . TIA (transient ischemic attack) 11/06/2014  . Syncope 10/19/2013  . Cannabis abuse 10/19/2013  . Seizure disorder 10/19/2013  . CKD (chronic kidney disease) stage 2, GFR 60-89 ml/min 10/19/2013  . Edema 05/05/2013  . Cardiomegaly 05/05/2013  . Hepatitis C     CBC    Component Value Date/Time   WBC 10.8* 11/06/2014 1740   RBC 4.29 11/06/2014 1740   HGB 13.3 11/06/2014 1740   HCT 40.4 11/06/2014 1740   PLT 146* 11/06/2014 1740   MCV 94.2 11/06/2014 1740   LYMPHSABS 1.2 11/06/2014 1740   MONOABS 0.8 11/06/2014 1740   EOSABS 0.1 11/06/2014 1740   BASOSABS 0.0 11/06/2014 1740    CMP     Component Value Date/Time   NA 139 11/06/2014 1740   K 3.9 11/06/2014 1740   CL 106 11/06/2014 1740   CO2 24 11/06/2014 1740   GLUCOSE 108* 11/06/2014 1740   BUN 15 11/06/2014 1740   CREATININE 1.19 11/06/2014 1740   CALCIUM 8.9 11/06/2014 1740   PROT 7.0 11/06/2014 1740   ALBUMIN 3.5 11/06/2014 1740   AST 16 11/06/2014 1740   ALT 10 11/06/2014 1740   ALKPHOS 73 11/06/2014 1740   BILITOT 0.5 11/06/2014 1740   GFRNONAA 61* 11/06/2014 1740   GFRAA 71* 11/06/2014 1740    Assessment and Plan  TIA (transient ischemic attack) Pt presented with left facial weakness and dysarthria. The patient denies any weakness in the arms or legs, but states had unsteady gait and felt generalized weakness. The patient had CT of the brain and MRI of the brain which showed small vessel disease; no acute ischemic changes were noted. The patient presently without sx.Swallowing study normal   Cannabis abuse UDS+ for marijuana   Bipolar disorder Being tx with risperdal and seroquel;Plan- continue home meds   Hepatitis C Pt says he was treated with 2 meds for months for this in [place] and it is cured;not receiving any treatment currently; I make  the assumption that more info is needed   History of opioid abuse Pt's drug screen was positive for opiates on current hospitalization admission, yet d/c summary says pt has been advised to stop hydrocodone; per nursing brother has expressed concern that pt is taking things he shouldn't; no narcotics are written for him here, even though he says he goes to a pain clinic, he doesn't appear to be in any discomfort without them; as a former heroin abuser, any narcotic (even marijuana) is a slippery slope for him     Hennie Duos, MD

## 2014-11-11 DIAGNOSIS — I639 Cerebral infarction, unspecified: Secondary | ICD-10-CM | POA: Insufficient documentation

## 2014-11-16 ENCOUNTER — Encounter: Payer: Self-pay | Admitting: Internal Medicine

## 2014-11-16 DIAGNOSIS — G40909 Epilepsy, unspecified, not intractable, without status epilepticus: Secondary | ICD-10-CM

## 2014-11-16 DIAGNOSIS — N182 Chronic kidney disease, stage 2 (mild): Secondary | ICD-10-CM

## 2014-11-16 DIAGNOSIS — I639 Cerebral infarction, unspecified: Secondary | ICD-10-CM

## 2014-11-16 DIAGNOSIS — E1340 Other specified diabetes mellitus with diabetic neuropathy, unspecified: Secondary | ICD-10-CM

## 2014-11-16 NOTE — Progress Notes (Signed)
This encounter was created in error - please disregard.

## 2014-11-20 ENCOUNTER — Non-Acute Institutional Stay (SKILLED_NURSING_FACILITY): Payer: Medicare Other | Admitting: Internal Medicine

## 2014-11-20 ENCOUNTER — Encounter: Payer: Self-pay | Admitting: Internal Medicine

## 2014-11-20 DIAGNOSIS — G40909 Epilepsy, unspecified, not intractable, without status epilepticus: Secondary | ICD-10-CM

## 2014-11-20 DIAGNOSIS — G451 Carotid artery syndrome (hemispheric): Secondary | ICD-10-CM

## 2014-11-20 DIAGNOSIS — F1110 Opioid abuse, uncomplicated: Secondary | ICD-10-CM | POA: Diagnosis not present

## 2014-11-20 DIAGNOSIS — F1210 Cannabis abuse, uncomplicated: Secondary | ICD-10-CM

## 2014-11-20 DIAGNOSIS — F1111 Opioid abuse, in remission: Secondary | ICD-10-CM

## 2014-11-20 DIAGNOSIS — F3130 Bipolar disorder, current episode depressed, mild or moderate severity, unspecified: Secondary | ICD-10-CM

## 2014-11-20 DIAGNOSIS — B1920 Unspecified viral hepatitis C without hepatic coma: Secondary | ICD-10-CM | POA: Diagnosis not present

## 2014-11-20 DIAGNOSIS — E1340 Other specified diabetes mellitus with diabetic neuropathy, unspecified: Secondary | ICD-10-CM

## 2014-11-20 NOTE — Progress Notes (Signed)
MRN: [id number] Name: Terry Baxter  Sex: male Age: 69 y.o. DOB: November 14, 1945  [place] #: [facility] Facility/Room:108 Level Of Care: SNF Provider: Inocencio Homes D Emergency Contacts: Extended Emergency Contact Information Primary Emergency Contact: [name],[name] Address: [place], [place] Phone: [phone] Relation: Friend  Code Status: FULL  Allergies: Review of patient's allergies indicates no known allergies.  Chief Complaint  Patient presents with  . Discharge Note    HPI: Patient is 69 y.o. male who was admitted to SNF for OT/PT after a TIA involving balance is now ready to be d/c to home. Past Medical History  Diagnosis Date  . Hepatitis C   . Diabetes mellitus   . Hypertension     History reviewed. No pertinent past surgical history.    Medication List       This list is accurate as of: 11/20/14  2:26 PM.  Always use your most recent med list.               aspirin 325 MG tablet  Take 1 tablet (325 mg total) by mouth daily.     atorvastatin 40 MG tablet  Commonly known as:  LIPITOR  Take 1 tablet (40 mg total) by mouth daily at 6 PM.     busPIRone 10 MG tablet  Commonly known as:  BUSPAR  Take 10 mg by mouth 2 (two) times daily.     clonazePAM 0.5 MG tablet  Commonly known as:  KLONOPIN  Take 1 tablet (0.5 mg total) by mouth 2 (two) times daily as needed for anxiety.     gabapentin 600 MG tablet  Commonly known as:  NEURONTIN  Take 600 mg by mouth daily.     levETIRAcetam 500 MG tablet  Commonly known as:  KEPPRA  Take 500 mg by mouth 2 (two) times daily.     QUEtiapine 100 MG tablet  Commonly known as:  SEROQUEL  Take 1 tablet (100 mg total) by mouth at bedtime.     risperiDONE 0.5 MG tablet  Commonly known as:  RISPERDAL  Take 1 tablet (0.5 mg total) by mouth 2 (two) times daily.     senna-docusate 8.6-50 MG per tablet  Commonly known as:  Senokot-S  Take 1 tablet by mouth at bedtime as  needed for mild constipation.        No orders of the defined types were placed in this encounter.     There is no immunization history on file for this patient.  History  Substance Use Topics  . Smoking status: Never Smoker   . Smokeless tobacco: Not on file  . Alcohol Use: No    Filed Vitals:   11/20/14 1415  BP: 114/69  Pulse: 67  Temp: 98.2 F (36.8 C)  Resp: 20    Physical Exam  GENERAL APPEARANCE: Alert, conversant. No acute distress.  HEENT: Unremarkable. RESPIRATORY: Breathing is even, unlabored. Lung sounds are clear   CARDIOVASCULAR: Heart RRR no murmurs, rubs or gallops. No peripheral edema.  GASTROINTESTINAL: Abdomen is soft, non-tender, not distended w/ normal bowel sounds.  NEUROLOGIC: Cranial nerves 2-12 grossly intact. Moves all extremities  Patient Active Problem List   Diagnosis Date Noted  . Stroke with cerebral ischemia   . Bipolar disorder 11/10/2014  . History of opioid abuse 11/10/2014  . Neuropathy due to secondary diabetes mellitus 11/10/2014  . TIA (transient ischemic attack) 11/06/2014  . Syncope 10/19/2013  .  Cannabis abuse 10/19/2013  . Seizure disorder 10/19/2013  . CKD (chronic kidney disease) stage 2, GFR 60-89 ml/min 10/19/2013  . Edema 05/05/2013  . Cardiomegaly 05/05/2013  . Hepatitis C     CBC    Component Value Date/Time   WBC 10.8* 11/06/2014 1740   RBC 4.29 11/06/2014 1740   HGB 13.3 11/06/2014 1740   HCT 40.4 11/06/2014 1740   PLT 146* 11/06/2014 1740   MCV 94.2 11/06/2014 1740   LYMPHSABS 1.2 11/06/2014 1740   MONOABS 0.8 11/06/2014 1740   EOSABS 0.1 11/06/2014 1740   BASOSABS 0.0 11/06/2014 1740    CMP     Component Value Date/Time   NA 139 11/06/2014 1740   K 3.9 11/06/2014 1740   CL 106 11/06/2014 1740   CO2 24 11/06/2014 1740   GLUCOSE 108* 11/06/2014 1740   BUN 15 11/06/2014 1740   CREATININE 1.19 11/06/2014 1740   CALCIUM 8.9 11/06/2014 1740   PROT 7.0 11/06/2014 1740   ALBUMIN 3.5 11/06/2014  1740   AST 16 11/06/2014 1740   ALT 10 11/06/2014 1740   ALKPHOS 73 11/06/2014 1740   BILITOT 0.5 11/06/2014 1740   GFRNONAA 61* 11/06/2014 1740   GFRAA 71* 11/06/2014 1740    Assessment and Plan  Pt is d/c to home with HH/OT/PT/nursing. DMEs are rolling walker and bedside commode.  Hennie Duos, MD

## 2015-01-08 ENCOUNTER — Telehealth: Payer: Self-pay | Admitting: *Deleted

## 2015-01-08 NOTE — Telephone Encounter (Signed)
Spoke with patient and rescheduled his hospital FU to be with Dr Erlinda Hong, for 02/08/15. Pt agreed, verbalized understanding of new date/time.

## 2015-01-09 ENCOUNTER — Ambulatory Visit: Payer: Self-pay | Admitting: Nurse Practitioner

## 2015-02-08 ENCOUNTER — Ambulatory Visit (INDEPENDENT_AMBULATORY_CARE_PROVIDER_SITE_OTHER): Payer: Medicare Other | Admitting: Neurology

## 2015-02-08 ENCOUNTER — Encounter: Payer: Self-pay | Admitting: Neurology

## 2015-02-08 VITALS — BP 101/72 | HR 65 | Ht 69.0 in | Wt 266.8 lb

## 2015-02-08 DIAGNOSIS — G451 Carotid artery syndrome (hemispheric): Secondary | ICD-10-CM

## 2015-02-08 DIAGNOSIS — I639 Cerebral infarction, unspecified: Secondary | ICD-10-CM | POA: Diagnosis not present

## 2015-02-08 DIAGNOSIS — E1159 Type 2 diabetes mellitus with other circulatory complications: Secondary | ICD-10-CM | POA: Diagnosis not present

## 2015-02-08 DIAGNOSIS — E785 Hyperlipidemia, unspecified: Secondary | ICD-10-CM | POA: Diagnosis not present

## 2015-02-08 DIAGNOSIS — R569 Unspecified convulsions: Secondary | ICD-10-CM | POA: Diagnosis not present

## 2015-02-08 NOTE — Progress Notes (Signed)
STROKE NEUROLOGY FOLLOW UP NOTE  NAME: Terry Baxter DOB: 01-Aug-1945  REASON FOR VISIT: stroke follow up HISTORY FROM: chart and pt  Today we had the pleasure of seeing Terry Baxter in follow-up at our Neurology Clinic. Pt was accompanied by no one.   History Summary Terry Baxter is a 69 y.o. male with history of HTN, DM, hepatitis C, and seizures was admitted on 11/06/14 for left face weakness, dysarthria, unsteadiness. Symptoms resolved quickly and MRI negative for acute stroke.  Stroke w/u including MRA, CUS, TTE were negative. LDL 127, put on lipitor. A1c 6.1. Continue ASA and keppra. He was discharged home in good condition.  Interval History During the interval time, the patient has been doing well. He walks 0.5 mile daily. No recurrent symptoms. BP at home 115/76 and in clinic 101/72. Glucose at home ranging from 78-98. He has no seizure since 2014. He will see PCP on 02/13/15. He walks with cane.  REVIEW OF SYSTEMS: Full 14 system review of systems performed and notable only for those listed below and in HPI above, all others are negative:  Constitutional:  Weight gain, fatigue Cardiovascular:  Ear/Nose/Throat:   Skin:  Eyes:   Respiratory:   Gastroitestinal:  constipation Genitourinary: blood in urine, impotence Hematology/Lymphatic:   Endocrine:  Musculoskeletal:   Allergy/Immunology:   Neurological:   Psychiatric:  Sleep:   The following represents the patient's updated allergies and side effects list: No Known Allergies  The neurologically relevant items on the patient's problem list were reviewed on today's visit.  Neurologic Examination  A problem focused neurological exam (12 or more points of the single system neurologic examination, vital signs counts as 1 point, cranial nerves count for 8 points) was performed.  Blood pressure 101/72, pulse 65, height 5\' 9"  (1.753 m), weight 266 lb 12.8 oz (121.02 kg).  General - obese, well developed, in no  apparent distress.  Ophthalmologic - Fundi not visualized due to eye movement.  Cardiovascular - Regular rate and rhythm with no murmur.  Mental Status -  Level of arousal and orientation to time, place, and person were intact. Language including expression, naming, repetition, comprehension was assessed and found intact. Fund of Knowledge was assessed and was intact.  Cranial Nerves II - XII - II - Visual field intact OU. III, IV, VI - Extraocular movements intact. V - Facial sensation intact bilaterally. VII - Facial movement intact bilaterally. VIII - Hearing & vestibular intact bilaterally. X - Palate elevates symmetrically. XI - Chin turning & shoulder shrug intact bilaterally. XII - Tongue protrusion intact.  Motor Strength - The patient's strength was normal in all extremities and pronator drift was absent.  Bulk was normal and fasciculations were absent.   Motor Tone - Muscle tone was assessed at the neck and appendages and was normal.  Reflexes - The patient's reflexes were 1+ in all extremities and he had no pathological reflexes.  Sensory - Light touch, temperature/pinprick were assessed and were normal.    Coordination - The patient had normal movements in the hands and feet with no ataxia or dysmetria.  Tremor was absent.  Gait and Station - walk with cane, stable and normal stride.  Data reviewed: I personally reviewed the images and agree with the radiology interpretations.  Ct Head Wo Contrast 11/06/2014 No CT findings of acute ischemia, or significant change from prior exam.   Terry Brain Wo Contrast 11/06/2014 Mild chronic microvascular ischemia. No acute infarct   Terry Baxter - [place] Contrast  11/06/2014 Limited MRA of the brain due to motion. No large vessel occlusion.   CUS - 1-39% stenosis bilaterally. Vertebral arteries are patent and antegrade bilaterally.  EEG - Normal awake electroencephalogram with activation procedures. There are no focal  lateralizing or epileptiform features. Comment: An EEG with the patient sleep deprived to elicit drowse and light sleep may be desirable to further elicit a possible seizure disorder.  TTE - - Left ventricle: The cavity size was normal. Systolic function was mildly reduced. The estimated ejection fraction was in the range of 45% to 50%. Diffuse hypokinesis. - Atrial septum: No defect or patent foramen ovale was identified.  Component     Latest Ref Rng 11/07/2014  Cholesterol     0 - 200 mg/dL 193  Triglycerides     <150 mg/dL 53  HDL Cholesterol     >39 mg/dL 55  Total CHOL/HDL Ratio      3.5  VLDL     0 - 40 mg/dL 11  LDL (calc)     0 - 99 mg/dL 127 (H)  Hemoglobin A1C     4.8 - 5.6 % 6.1 (H)  Mean Plasma Glucose      128    Assessment: As you may recall, he is a 69 y.o. African American male with PMH of HTN, DM, hepatitis C, and seizures was admitted on 11/06/14 for possible TIA episode. Stroke w/u including MRI, MRA, CUS, TTE were all negative. LDL 127, put on lipitor. A1c 6.1. Continue ASA and keppra. He has been doing well during the interval time. BP and glucose in control.  Plan:  - continue ASA and lipitor for stroke prevention - continue keppra for seizure control - check BP and glucose at home - Follow up with your primary care physician for stroke risk factor modification. Recommend maintain blood pressure goal <130/80, diabetes with hemoglobin A1c goal below 6.5% and lipids with LDL cholesterol goal below 70 mg/dL.  - healthy diet and regular exercise - RTC in 6 months.   No orders of the defined types were placed in this encounter.    Meds ordered this encounter  Medications  . DISCONTD: busPIRone (BUSPAR) 15 MG tablet    Sig: Take 15 mg by mouth 2 (two) times daily.    Refill:  0  . DISCONTD: doxepin (SINEQUAN) 10 MG capsule    Sig: TAKE 1-2 CAPSULES BY MOUTH AT BEDTIME    Refill:  1  . hydrochlorothiazide (MICROZIDE) 12.5 MG capsule    Sig: Take  12.5 mg by mouth daily.    Refill:  4  . HYDROcodone-acetaminophen (NORCO) 7.5-325 MG per tablet    Sig: Take 1 tablet by mouth 3 (three) times daily as needed.    Refill:  0  . naproxen (NAPROSYN) 500 MG tablet    Sig: Take 500 mg by mouth 2 (two) times daily.    Refill:  0    Patient Instructions  - continue ASA and lipitor for stroke prevention - continue keppra for seizure control - check BP and glucose at home and record and bring over to PCP - Follow up with your primary care physician for stroke risk factor modification. Recommend maintain blood pressure goal <130/80, diabetes with hemoglobin A1c goal below 6.5% and lipids with LDL cholesterol goal below 70 mg/dL.  - healthy diet and regular exercise - follow up in 6 months.     Rosalin Hawking, MD PhD [facility] [address] [place], [place] [address] [phone]   

## 2015-02-08 NOTE — Patient Instructions (Signed)
-   continue ASA and lipitor for stroke prevention - continue keppra for seizure control - check BP and glucose at home and record and bring over to PCP - Follow up with your primary care physician for stroke risk factor modification. Recommend maintain blood pressure goal <130/80, diabetes with hemoglobin A1c goal below 6.5% and lipids with LDL cholesterol goal below 70 mg/dL.  - healthy diet and regular exercise - follow up in 6 months.

## 2015-08-14 ENCOUNTER — Ambulatory Visit (INDEPENDENT_AMBULATORY_CARE_PROVIDER_SITE_OTHER): Payer: Medicare Other | Admitting: Nurse Practitioner

## 2015-08-14 ENCOUNTER — Encounter: Payer: Self-pay | Admitting: Nurse Practitioner

## 2015-08-14 DIAGNOSIS — G451 Carotid artery syndrome (hemispheric): Secondary | ICD-10-CM | POA: Diagnosis not present

## 2015-08-14 DIAGNOSIS — G40909 Epilepsy, unspecified, not intractable, without status epilepticus: Secondary | ICD-10-CM

## 2015-08-14 DIAGNOSIS — E785 Hyperlipidemia, unspecified: Secondary | ICD-10-CM | POA: Diagnosis not present

## 2015-08-14 NOTE — Progress Notes (Signed)
[facility]  PATIENT: Terry Baxter DOB: 16-Oct-1945   REASON FOR VISIT: Follow-up for TIA,  hyperlipidemia, seizure disorder, diabetes, bipolar disorder HISTORY FROM: Patient    HISTORY OF PRESENT ILLNESS: HISTORY JXMr. Terry Baxter is a 70 y.o. male with history of HTN, DM, hepatitis C, and seizures was admitted on 11/06/14 for left face weakness, dysarthria, unsteadiness. Symptoms resolved quickly and MRI negative for acute stroke. Stroke w/u including MRA, CUS, TTE were negative. LDL 127, put on lipitor. A1c 6.1. Continue ASA and keppra. He was discharged home in good condition.  Interval History7/16/16 JXDuring the interval time, the patient has been doing well. He walks 0.5 mile daily. No recurrent symptoms. BP at home 115/76 and in clinic 101/72. Glucose at home ranging from 78-98. He has no seizure since 2014. He will see PCP on 02/13/15. He walks with cane. UPDATE 08/14/15 Terry Baxter, 70 year old male returns for follow-up. He has previously been seen in this office by [name]. He has a history of left facial weakness dysarthria and unsteadiness on 11/06/2014 and his symptoms resolved quickly stroke workup was negative. He also has a history of hypertension hyperlipidemia and diabetes which are in good control according to the patient. His blood pressure in the office today is 119/85. CBC checked yesterday was 114. He remains active by going to the [facility] 3 times a week to walk. He does not drive, he uses public transportation. He also has history of bipolar disorder and sees [name]  every 2 months. Patient denies further stroke or TIA symptoms. Labs followed by PCP [name]. He returns for reevaluation  REVIEW OF SYSTEMS: Full 14 system review of systems performed and notable only for those listed, all others are neg:  Constitutional: Fatigue  Cardiovascular: neg Ear/Nose/Throat: neg  Skin: neg Eyes: neg Respiratory: neg Gastroitestinal: Urinary  frequency Hematology/Lymphatic: neg  Endocrine: neg Musculoskeletal:neg Allergy/Immunology: neg Neurological: Memory loss Psychiatric: Bipolar disorder Sleep : neg   ALLERGIES: No Known Allergies  HOME MEDICATIONS: Outpatient Prescriptions Prior to Visit  Medication Sig Dispense Refill  . aspirin 325 MG tablet Take 1 tablet (325 mg total) by mouth daily. 30 tablet 3  . atorvastatin (LIPITOR) 40 MG tablet Take 1 tablet (40 mg total) by mouth daily at 6 PM. 30 tablet 3  . busPIRone (BUSPAR) 10 MG tablet Take 10 mg by mouth 2 (two) times daily.    . hydrochlorothiazide (MICROZIDE) 12.5 MG capsule Take 12.5 mg by mouth daily.  4  . HYDROcodone-acetaminophen (NORCO) 7.5-325 MG per tablet Take 1 tablet by mouth 3 (three) times daily as needed.  0  . levETIRAcetam (KEPPRA) 500 MG tablet Take 500 mg by mouth 2 (two) times daily.    . naproxen (NAPROSYN) 500 MG tablet Take 500 mg by mouth 2 (two) times daily as needed.   0  . risperiDONE (RISPERDAL) 0.5 MG tablet Take 1 tablet (0.5 mg total) by mouth 2 (two) times daily. 60 tablet 3  . senna-docusate (SENOKOT-S) 8.6-50 MG per tablet Take 1 tablet by mouth at bedtime as needed for mild constipation. 30 tablet 3  . clonazePAM (KLONOPIN) 0.5 MG tablet Take 1 tablet (0.5 mg total) by mouth 2 (two) times daily as needed for anxiety. 30 tablet 0  . gabapentin (NEURONTIN) 600 MG tablet Take 600 mg by mouth daily.     . QUEtiapine (SEROQUEL) 100 MG tablet Take 1 tablet (100 mg total) by mouth at bedtime. (Patient not taking: Reported on 02/08/2015) 30 tablet 3  No facility-administered medications prior to visit.    PAST MEDICAL HISTORY: Past Medical History  Diagnosis Date  . Hepatitis C   . Diabetes mellitus   . Hypertension   . Hyperlipidemia     PAST SURGICAL HISTORY: History reviewed. No pertinent past surgical history.  FAMILY HISTORY: History reviewed. No pertinent family history.  SOCIAL HISTORY: Social History   Social  History  . Marital Status: Legally Separated    Spouse Name: N/A  . Number of Children: N/A  . Years of Education: N/A   Occupational History  . retired    Social History Main Topics  . Smoking status: Never Smoker   . Smokeless tobacco: Not on file  . Alcohol Use: No  . Drug Use: Yes    Special: Amphetamines     Comment: h/o heroin abuse, marijuana  . Sexual Activity: Not on file   Other Topics Concern  . Not on file   Social History Narrative   Lives with room mate at home    Never married    Has 2 children, 4 years of college    1 cup of caffeine a week     PHYSICAL EXAM  Filed Vitals:   08/14/15 0919  BP: 119/85  Pulse: 70  Height: 5\' 9"  (1.753 m)  Weight: 275 lb 9.6 oz (125.011 kg)   Body mass index is 40.68 kg/(m^2). General - obese, well developed, in no apparent distress. Cardiovascular - Regular rate and rhythm with no murmur. Neck supple no carotid bruits  Mental Status -  Level of arousal and orientation to time, place, and person were intact. Language including expression, naming, repetition, comprehension was assessed and found intact. Fund of Knowledge was assessed and was intact.MMSE 28/30   Cranial Nerves II - XII - II - Visual field intact OU.Fundi not visualized due to eye movement. III, IV, VI - Extraocular movements intact. V - Facial sensation intact bilaterally. VII - Facial movement intact bilaterally. VIII - Hearing & vestibular intact bilaterally. X - Palate elevates symmetrically. XI - Chin turning & shoulder shrug intact bilaterally. XII - Tongue protrusion intact.  Motor Strength - The patient's strength was normal in all extremities and pronator drift was absent. Bulk and tone was normal . Reflexes - The patient's reflexes were 1+ in all extremities and he had no pathological reflexes. Sensory - Light touch, temperature/pinprick were assessed and were normal.  Coordination - The patient had normal movements in the hands and  feet with no ataxia or dysmetria. Tremor was absent. Gait and Station - walk with cane, stable and normal stride.No difficulty with turns DIAGNOSTIC DATA (LABS, IMAGING, TESTING) - I reviewed patient records, labs, notes, testing and imaging myself where available.  Lab Results  Component Value Date   WBC 10.8* 11/06/2014   HGB 13.3 11/06/2014   HCT 40.4 11/06/2014   MCV 94.2 11/06/2014   PLT 146* 11/06/2014      Component Value Date/Time   NA 139 11/06/2014 1740   K 3.9 11/06/2014 1740   CL 106 11/06/2014 1740   CO2 24 11/06/2014 1740   GLUCOSE 108* 11/06/2014 1740   BUN 15 11/06/2014 1740   CREATININE 1.19 11/06/2014 1740   CALCIUM 8.9 11/06/2014 1740   PROT 7.0 11/06/2014 1740   ALBUMIN 3.5 11/06/2014 1740   AST 16 11/06/2014 1740   ALT 10 11/06/2014 1740   ALKPHOS 73 11/06/2014 1740   BILITOT 0.5 11/06/2014 1740   GFRNONAA 61* 11/06/2014 1740   GFRAA  71* 11/06/2014 1740   Lab Results  Component Value Date   CHOL 193 11/07/2014   HDL 55 11/07/2014   LDLCALC 127* 11/07/2014   TRIG 53 11/07/2014   CHOLHDL 3.5 11/07/2014   Lab Results  Component Value Date   HGBA1C 6.1* 11/07/2014   Mr Brain Wo Contrast 11/06/2014 Mild chronic microvascular ischemia. No acute infarct   Mr Virgel [place] Wo Contrast 11/06/2014 Limited MRA of the brain due to motion. No large vessel occlusion.   CUS - 1-39% stenosis bilaterally. Vertebral arteries are patent and antegrade bilaterally.  EEG - Normal awake electroencephalogram with activation procedures. There are no focal lateralizing or epileptiform features.  ASSESSMENT AND PLAN 70 y.o. African American male with PMH of HTN, DM, hepatitis C, and seizures and hyperlipidemia was admitted on 11/06/14 for possible TIA episode. Stroke w/u including MRI, MRA, CUS, TTE were all negative. LDL 127, put on lipitor. A1c 6.1. Continue ASA and keppra. He has been doing well during the interval time. BP and glucose in control. Reviewed  previous record The patient is a current patient of [name]  who is out of the office today . This note is sent to the work in doctor.    PLAN: Management of risk factors to prevent  repeat stroke/TIA continue ASA and lipitor for stroke prevention Labs followed by [name] Continue keppra for seizure control B/P 119/85 in the office Diabetes in good control CBG yesterday 114 reported CBG. Follow up with your primary care physician for stroke risk factor modification. Recommend maintain blood pressure goal <130/80, diabetes with hemoglobin A1c goal below 6.5% and lipids with LDL cholesterol goal below 70 mg/dL.  healthy diet and regular exercise continue walking at the [facility] Continue follow up with [name], psych RTC in 6 months.If continues to be stable will discharge at that time Vst time 25 min Dennie Bible, [facility], [facility], [facility] [address], [place], [place] [address] [phone]

## 2015-08-14 NOTE — Patient Instructions (Signed)
continue ASA and lipitor for stroke prevention Labs followed by [name] Continue keppra for seizure control B/P 119/85 in the office Diabetes in good control CBG yesterday 114 reported CBG. Follow up with your primary care physician for stroke risk factor modification. Recommend maintain blood pressure goal <130/80, diabetes with hemoglobin A1c goal below 6.5% and lipids with LDL cholesterol goal below 70 mg/dL.  healthy diet and regular exercise continue walking at the [facility] Continue follow up with [name] RTC in 6 months.

## 2015-08-14 NOTE — Progress Notes (Signed)
I agree with the assessment and plan as directed by NP .The patient is known to me .   Terry Ricotta, MD  

## 2015-08-20 ENCOUNTER — Ambulatory Visit: Payer: Medicare Other | Admitting: Neurology

## 2015-10-31 ENCOUNTER — Other Ambulatory Visit (HOSPITAL_COMMUNITY)
Admission: RE | Admit: 2015-10-31 | Discharge: 2015-10-31 | Disposition: A | Discharge status: Home / Self Care | Payer: Medicare Other | Source: Ambulatory Visit | Attending: Cardiology | Admitting: Cardiology

## 2015-10-31 DIAGNOSIS — Z029 Encounter for administrative examinations, unspecified: Secondary | ICD-10-CM | POA: Insufficient documentation

## 2015-10-31 LAB — COMPREHENSIVE METABOLIC PANEL
ALT: 13 U/L — ABNORMAL LOW (ref 17–63)
AST: 28 U/L (ref 15–41)
Albumin: 4.1 g/dL (ref 3.5–5.0)
Alkaline Phosphatase: 77 U/L (ref 38–126)
Anion gap: 12 (ref 5–15)
BILIRUBIN TOTAL: 0.8 mg/dL (ref 0.3–1.2)
BUN: 15 mg/dL (ref 6–20)
CHLORIDE: 105 mmol/L (ref 101–111)
CO2: 23 mmol/L (ref 22–32)
Calcium: 9.9 mg/dL (ref 8.9–10.3)
Creatinine, Ser: 1.44 mg/dL — ABNORMAL HIGH (ref 0.61–1.24)
GFR calc Af Amer: 56 mL/min — ABNORMAL LOW (ref >60)
GFR calc non Af Amer: 48 mL/min — ABNORMAL LOW (ref >60)
GLUCOSE: 125 mg/dL — AB (ref 65–99)
POTASSIUM: 3.9 mmol/L (ref 3.5–5.1)
Sodium: 140 mmol/L (ref 135–145)
TOTAL PROTEIN: 8.9 g/dL — AB (ref 6.5–8.1)

## 2015-10-31 LAB — CBC
HCT: 45.4 % (ref 39.0–52.0)
Hemoglobin: 14.8 g/dL (ref 13.0–17.0)
MCH: 30.6 pg (ref 26.0–34.0)
MCHC: 32.6 g/dL (ref 30.0–36.0)
MCV: 94 fL (ref 78.0–100.0)
Platelets: 187 10*3/uL (ref 150–400)
RBC: 4.83 MIL/uL (ref 4.22–5.81)
RDW: 13.8 % (ref 11.5–15.5)
WBC: 10.2 10*3/uL (ref 4.0–10.5)

## 2015-10-31 LAB — PSA: PSA: 0.2 ng/mL (ref 0.00–4.00)

## 2015-11-01 LAB — HEMOGLOBIN A1C
Hgb A1c MFr Bld: 6 % — ABNORMAL HIGH (ref 4.8–5.6)
Mean Plasma Glucose: 126 mg/dL

## 2015-11-28 ENCOUNTER — Encounter: Payer: Medicare Other | Attending: Cardiology | Admitting: Dietician

## 2015-11-28 ENCOUNTER — Encounter: Payer: Self-pay | Admitting: Dietician

## 2015-11-28 VITALS — Ht 69.0 in | Wt 289.0 lb

## 2015-11-28 DIAGNOSIS — E119 Type 2 diabetes mellitus without complications: Secondary | ICD-10-CM | POA: Diagnosis present

## 2015-11-28 DIAGNOSIS — I10 Essential (primary) hypertension: Secondary | ICD-10-CM

## 2015-11-28 DIAGNOSIS — E669 Obesity, unspecified: Secondary | ICD-10-CM

## 2015-11-28 NOTE — Patient Instructions (Signed)
Keep up the exercise most days of the week.  Walking is great! Continue to use the whole wheat bread. Change sweet tea to unsweetened and sweetened tea and sweeten with splenda if desired. Reduce the serving size of sugar free pudding to 1/2 cup. Add more non starchy vegetables.  You should have vegetables at every lunch and dinner. When choosing the TV dinner, read the label.  Look for ones with less than 500 calories, less than 17 grams of fat, less than 700 mg sodium, around 45-60 grams of carbohydrate.  Healthier options include Lean Cuisine, EVOL, Healthy choice.

## 2015-11-28 NOTE — Progress Notes (Signed)
Diabetes Self-Management Education  Visit Type: First/Initial  Appt. Start Time: 1230 Appt. End Time: 1330  11/28/2015  Mr. Terry Baxter, identified by name and date of birth, is a 70 y.o. male with a diagnosis of Diabetes: Type 2. He reports hx of type 2 diabetes for the past 10 years.  He would like to lose weight.  Weight increase after starting antipsychotic >10 years ago.  He lives alone.  His brother drove him but did not come into the appointment.  Patient does not cook but does his own shopping with transportation.  He has been eating sandwiches for lunch and dinner due to forgetting TV dinners which he often relies on.  Question safety if he would cook.  He only boils eggs.  Question advanced comprehension.  Some of his affect may be medication related.  Simplified education provided. Referral for hypercholesterolemia, HTN, obesity, and type 2 diabetes.  Labs include GFR 56, Creatinine 1.44, HgbA1C 6% 11/28/15.   ASSESSMENT  Height 5\' 9"  (1.753 m), weight 289 lb (131.09 kg). Body mass index is 42.66 kg/(m^2).      Diabetes Self-Management Education - 11/28/15 1239    Visit Information   Visit Type First/Initial   Initial Visit   Diabetes Type Type 2   Are you taking your medications as prescribed? Yes   Date Diagnosed 10 years ago   Health Coping   How would you rate your overall health? Fair   Psychosocial Assessment   Patient Belief/Attitude about Diabetes Motivated to manage diabetes   Self-care barriers Unable to determine   Self-management support Doctor's office   Other persons present Patient   Patient Concerns Weight Control   Special Needs Simplified materials   Preferred Learning Style No preference indicated   Learning Readiness Ready   How often do you need to have someone help you when you read instructions, pamphlets, or other written materials from your doctor or pharmacy? 1 - Never   What is the last grade level you completed in school? 4 years college   Pre-Education Assessment   Patient understands the diabetes disease and treatment process. Demonstrates understanding / competency   Patient understands incorporating nutritional management into lifestyle. Needs Instruction   Patient undertands incorporating physical activity into lifestyle. Demonstrates understanding / competency   Patient understands using medications safely. --  n/a   Patient understands monitoring blood glucose, interpreting and using results Demonstrates understanding / competency   Patient understands prevention, detection, and treatment of acute complications. Needs Instruction   Patient understands prevention, detection, and treatment of chronic complications. Needs Instruction   Patient understands how to develop strategies to address psychosocial issues. Demonstrates understanding / competency   Patient understands how to develop strategies to promote health/change behavior. Needs Instruction   Complications   Last HgB A1C per patient/outside source 6 %  10/31/15   How often do you check your blood sugar? 1-2 times/day   Fasting Blood glucose range (mg/dL) 70-129   Have you had a dilated eye exam in the past 12 months? Yes   Have you had a dental exam in the past 12 months? Yes   Are you checking your feet? Yes   How many days per week are you checking your feet? 7   Dietary Intake   Breakfast banana, boiled egg  9-10   Snack (morning) none   Lunch chicken sandwich on [employer] bread with mustard  1   Snack (afternoon) fruit cup or 1 cup sugar free pudding  Dinner chicken sandwich on [employer] bread with mustard OR lean cuisine frozen meal OR peanut butter sandwich  5   Snack (evening) 1 cup sugar free pudding   Beverage(s) sweet tea, water,    Exercise   Exercise Type Light (walking / raking leaves)  was going to the [facility]   How many days per week to you exercise? 7   How many minutes per day do you exercise? 30   Total minutes per week of exercise 210   Patient  Education   Previous Diabetes Education No   Nutrition management  Food label reading, portion sizes and measuring food.;Meal options for control of blood glucose level and chronic complications.   Physical activity and exercise  Role of exercise on diabetes management, blood pressure control and cardiac health.   Monitoring Daily foot exams;Yearly dilated eye exam   Psychosocial adjustment Worked with patient to identify barriers to care and solutions   Personal strategies to promote health Lifestyle issues that need to be addressed for better diabetes care   Individualized Goals (developed by patient)   Nutrition General guidelines for healthy choices and portions discussed   Physical Activity Exercise 5-7 days per week;30 minutes per day   Medications Not Applicable   Monitoring  test my blood glucose as discussed   Health Coping discuss diabetes with (comment)  MD   Post-Education Assessment   Patient understands the diabetes disease and treatment process. Demonstrates understanding / competency  based on ability to understand   Patient understands incorporating nutritional management into lifestyle. Demonstrates understanding / competency  basic knowledge   Patient undertands incorporating physical activity into lifestyle. Demonstrates understanding / competency   Patient understands using medications safely. --  n/a   Patient understands monitoring blood glucose, interpreting and using results Demonstrates understanding / competency   Patient understands prevention, detection, and treatment of acute complications. Needs Review   Patient understands prevention, detection, and treatment of chronic complications. Demonstrates understanding / competency  very basic knowlege   Patient understands how to develop strategies to address psychosocial issues. Demonstrates understanding / competency   Patient understands how to develop strategies to promote health/change behavior. Demonstrates  understanding / competency  basic knowledge   Outcomes   Expected Outcomes Other (comment)  question comprehension.  understood basic material.  patient verbalized understanding and willingness to change   Future DMSE PRN   Program Status Completed      Individualized Plan for Diabetes Self-Management Training:   Learning Objective:  Patient will have a greater understanding of diabetes self-management. Patient education plan is to attend individual and/or group sessions per assessed needs and concerns.   Plan:   Patient Instructions  Keep up the exercise most days of the week.  Walking is great! Continue to use the whole wheat bread. Change sweet tea to unsweetened and sweetened tea and sweeten with splenda if desired. Reduce the serving size of sugar free pudding to 1/2 cup. Add more non starchy vegetables.  You should have vegetables at every lunch and dinner. When choosing the TV dinner, read the label.  Look for ones with less than 500 calories, less than 17 grams of fat, less than 700 mg sodium, around 45-60 grams of carbohydrate.  Healthier options include Lean Cuisine, EVOL, Healthy choice.   Expected Outcomes:  Other (comment) (question comprehension.  understood basic material.  patient verbalized understanding and willingness to change)  Education material provided: Food label handouts and My Plate  If problems or questions, patient  to contact team via:  Phone and Email  Future DSME appointment: PRN

## 2015-12-15 ENCOUNTER — Emergency Department (HOSPITAL_COMMUNITY)
Admission: EM | Admit: 2015-12-15 | Discharge: 2015-12-15 | Disposition: A | Discharge status: Home / Self Care | Payer: Medicare Other | Attending: Emergency Medicine | Admitting: Emergency Medicine

## 2015-12-15 ENCOUNTER — Encounter (HOSPITAL_COMMUNITY): Payer: Self-pay

## 2015-12-15 DIAGNOSIS — R2241 Localized swelling, mass and lump, right lower limb: Secondary | ICD-10-CM | POA: Insufficient documentation

## 2015-12-15 DIAGNOSIS — Z7982 Long term (current) use of aspirin: Secondary | ICD-10-CM | POA: Insufficient documentation

## 2015-12-15 DIAGNOSIS — Z8619 Personal history of other infectious and parasitic diseases: Secondary | ICD-10-CM | POA: Diagnosis not present

## 2015-12-15 DIAGNOSIS — I10 Essential (primary) hypertension: Secondary | ICD-10-CM | POA: Insufficient documentation

## 2015-12-15 DIAGNOSIS — E119 Type 2 diabetes mellitus without complications: Secondary | ICD-10-CM | POA: Diagnosis not present

## 2015-12-15 DIAGNOSIS — M7989 Other specified soft tissue disorders: Secondary | ICD-10-CM

## 2015-12-15 DIAGNOSIS — Z79899 Other long term (current) drug therapy: Secondary | ICD-10-CM | POA: Insufficient documentation

## 2015-12-15 DIAGNOSIS — E785 Hyperlipidemia, unspecified: Secondary | ICD-10-CM | POA: Diagnosis not present

## 2015-12-15 MED ORDER — ENOXAPARIN SODIUM 150 MG/ML ~~LOC~~ SOLN
1.0000 mg/kg | Freq: Once | SUBCUTANEOUS | Status: AC
  Administered 2015-12-15: 130 mg via SUBCUTANEOUS
  Filled 2015-12-15: qty 0.88

## 2015-12-15 NOTE — Discharge Instructions (Signed)
Possible Deep Vein Thrombosis You will need to return it to the hospital for an outpatient ultrasound tomorrow. You have be given a dose of blood thinner tonight. A deep vein thrombosis (DVT) is a blood clot (thrombus) that usually occurs in a deep, larger vein of the lower leg or the pelvis, or in an upper extremity such as the arm. These are dangerous and can lead to serious and even life-threatening complications if the clot travels to the lungs. A DVT can damage the valves in your leg veins so that instead of flowing upward, the blood pools in the lower leg. This is called post-thrombotic syndrome, and it can result in pain, swelling, discoloration, and sores on the leg. CAUSES A DVT is caused by the formation of a blood clot in your leg, pelvis, or arm. Usually, several things contribute to the formation of blood clots. A clot may develop when:  Your blood flow slows down.  Your vein becomes damaged in some way.  You have a condition that makes your blood clot more easily. RISK FACTORS A DVT is more likely to develop in:  People who are older, especially over 43 years of age.  People who are overweight (obese).  People who sit or lie still for a long time, such as during long-distance travel (over 4 hours), bed rest, hospitalization, or during recovery from certain medical conditions like a stroke.  People who do not engage in much physical activity (sedentary lifestyle).  People who have chronic breathing disorders.  People who have a personal or family history of blood clots or blood clotting disease.  People who have peripheral vascular disease (PVD), diabetes, or some types of cancer.  People who have heart disease, especially if the person had a recent heart attack or has congestive heart failure.  People who have neurological diseases that affect the legs (leg paresis).  People who have had a traumatic injury, such as breaking a hip or leg.  People who have recently had  major or lengthy surgery, especially on the hip, knee, or abdomen.  People who have had a central line placed inside a large vein.  People who take medicines that contain the hormone estrogen. These include birth control pills and hormone replacement therapy.  Pregnancy or during childbirth or the postpartum period.  Long plane flights (over 8 hours). SIGNS AND SYMPTOMS Symptoms of a DVT can include:   Swelling of your leg or arm, especially if one side is much worse.  Warmth and redness of your leg or arm, especially if one side is much worse.  Pain in your arm or leg. If the clot is in your leg, symptoms may be more noticeable or worse when you stand or walk.  A feeling of pins and needles, if the clot is in the arm. The symptoms of a DVT that has traveled to the lungs (pulmonary embolism, PE) usually start suddenly and include:  Shortness of breath while active or at rest.  Coughing or coughing up blood or blood-tinged mucus.  Chest pain that is often worse with deep breaths.  Rapid or irregular heartbeat.  Feeling light-headed or dizzy.  Fainting.  Feeling anxious.  Sweating. There may also be pain and swelling in a leg if that is where the blood clot started. These symptoms may represent a serious problem that is an emergency. Do not wait to see if the symptoms will go away. Get medical help right away. Call your local emergency services (911 in the [place]  Do not drive yourself to the hospital. DIAGNOSIS Your health care provider will take a medical history and perform a physical exam. You may also have other tests, including:  Blood tests to assess the clotting properties of your blood.  Imaging tests, such as CT, ultrasound, MRI, X-ray, and other tests to see if you have clots anywhere in your body. TREATMENT After a DVT is identified, it can be treated. The type of treatment that you receive depends on many factors, such as the cause of your DVT, your risk for  bleeding or developing more clots, and other medical conditions that you have. Sometimes, a combination of treatments is necessary. Treatment options may be combined and include:  Monitoring the blood clot with ultrasound.  Taking medicines by mouth, such as newer blood thinners (anticoagulants), thrombolytics, or warfarin.  Taking anticoagulant medicine by injection or through an IV tube.  Wearing compression stockings or using different types ofdevices.  Surgery (rare) to remove the blood clot or to place a filter in your abdomen to stop the blood clot from traveling to your lungs. Treatments for a DVT are often divided into immediate treatment and long-term treatment (up to 3 months after DVT). You can work with your health care provider to choose the treatment program that is best for you. HOME CARE INSTRUCTIONS If you are taking a newer oral anticoagulant:  Take the medicine every single day at the same time each day.  Understand what foods and drugs interact with this medicine.  Understand that there are no regular blood tests required when using this medicine.  Understand the side effects of this medicine, including excessive bruising or bleeding. Ask your health care provider or pharmacist about other possible side effects. If you are taking warfarin:  Understand how to take warfarin and know which foods can affect how warfarin works in Veterinary surgeon.  Understand that it is dangerous to take too much or too little warfarin. Too much warfarin increases the risk of bleeding. Too little warfarin continues to allow the risk for blood clots.  Follow your PT and INR blood testing schedule. The PT and INR results allow your health care provider to adjust your dose of warfarin. It is very important that you have your PT and INR tested as often as told by your health care provider.  Avoid major changes in your diet, or tell your health care provider before you change your diet. Arrange a  visit with a registered dietitian to answer your questions. Many foods, especially foods that are high in vitamin K, can interfere with warfarin and affect the PT and INR results. Eat a consistent amount of foods that are high in vitamin K, such as:  Spinach, kale, broccoli, cabbage, collard greens, turnip greens, Brussels sprouts, peas, cauliflower, seaweed, and parsley.  Beef liver and pork liver.  Green tea.  Soybean oil.  Tell your health care provider about any and all medicines, vitamins, and supplements that you take, including aspirin and other over-the-counter anti-inflammatory medicines. Be especially cautious with aspirin and anti-inflammatory medicines. Do not take those before you ask your health care provider if it is safe to do so. This is important because many medicines can interfere with warfarin and affect the PT and INR results.  Do not start or stop taking any over-the-counter or prescription medicine unless your health care provider or pharmacist tells you to do so. If you take warfarin, you will also need to do these things:  Hold pressure over cuts  for longer than usual.  Tell your dentist and other health care providers that you are taking warfarin before you have any procedures in which bleeding may occur.  Avoid alcohol or drink very small amounts. Tell your health care provider if you change your alcohol intake.  Do not use tobacco products, including cigarettes, chewing tobacco, and e-cigarettes. If you need help quitting, ask your health care provider.  Avoid contact sports. General Instructions  Take over-the-counter and prescription medicines only as told by your health care provider. Anticoagulant medicines can have side effects, including easy bruising and difficulty stopping bleeding. If you are prescribed an anticoagulant, you will also need to do these things:  Hold pressure over cuts for longer than usual.  Tell your dentist and other health care  providers that you are taking anticoagulants before you have any procedures in which bleeding may occur.  Avoid contact sports.  Wear a medical alert bracelet or carry a medical alert card that says you have had a PE.  Ask your health care provider how soon you can go back to your normal activities. Stay active to prevent new blood clots from forming.  Make sure to exercise while traveling or when you have been sitting or standing for a long period of time. It is very important to exercise. Exercise your legs by walking or by tightening and relaxing your leg muscles often. Take frequent walks.  Wear compression stockings as told by your health care provider to help prevent more blood clots from forming.  Do not use tobacco products, including cigarettes, chewing tobacco, and e-cigarettes. If you need help quitting, ask your health care provider.  Keep all follow-up appointments with your health care provider. This is important. PREVENTION Take these actions to decrease your risk of developing another DVT:  Exercise regularly. For at least 30 minutes every day, engage in:  Activity that involves moving your arms and legs.  Activity that encourages good blood flow through your body by increasing your heart rate.  Exercise your arms and legs every hour during long-distance travel (over 4 hours). Drink plenty of water and avoid drinking alcohol while traveling.  Avoid sitting or lying in bed for long periods of time without moving your legs.  Maintain a weight that is appropriate for your height. Ask your health care provider what weight is healthy for you.  If you are a woman who is over 25 years of age, avoid unnecessary use of medicines that contain estrogen. These include birth control pills.  Do not smoke, especially if you take estrogen medicines. If you need help quitting, ask your health care provider. If you are hospitalized, prevention measures may include:  Early walking  after surgery, as soon as your health care provider says that it is safe.  Receiving anticoagulants to prevent blood clots.If you cannot take anticoagulants, other options may be available, such as wearing compression stockings or using different types of devices. SEEK IMMEDIATE MEDICAL CARE IF:  You have new or increased pain, swelling, or redness in an arm or leg.  You have numbness or tingling in an arm or leg.  You have shortness of breath while active or at rest.  You have chest pain.  You have a rapid or irregular heartbeat.  You feel light-headed or dizzy.  You cough up blood.  You notice blood in your vomit, bowel movement, or urine. These symptoms may represent a serious problem that is an emergency. Do not wait to see if the symptoms  will go away. Get medical help right away. Call your local emergency services (911 in the [place] Do not drive yourself to the hospital.   This information is not intended to replace advice given to you by your health care provider. Make sure you discuss any questions you have with your health care provider.   Document Released: 07/14/2005 Document Revised: 04/04/2015 Document Reviewed: 11/08/2014 Elsevier Interactive Patient Education [employer].

## 2015-12-15 NOTE — ED Notes (Signed)
Onset 2 days right lower leg swelling.  No shortness of breath or chest pain.  Pt wanting to be seen to r/o blood clot.  No recent travel.

## 2015-12-15 NOTE — ED Provider Notes (Signed)
CSN: [id number]     Arrival date & time 12/15/15  1738 History   First MD Initiated Contact with Patient 12/15/15 1841     Chief Complaint  Patient presents with  . Leg Swelling     (Consider location/radiation/quality/duration/timing/severity/associated sxs/prior Treatment) HPI Patient has no prior history of DVT. He has noticed swelling of his right leg over the past 2 days. He reports his uncomfortable behind his knee and his calf. His doctor sent him to the hospital for evaluation for possible DVT. Patient has started walking more as part of an exercise routine. He has no other injury or change in routine habits. No recent travel. No associated chest pain or shortness of breath. Pain is exacerbated by weightbearing and walking. Past Medical History  Diagnosis Date  . Hepatitis C   . Diabetes mellitus   . Hypertension   . Hyperlipidemia    History reviewed. No pertinent past surgical history. History reviewed. No pertinent family history. Social History  Substance Use Topics  . Smoking status: Never Smoker   . Smokeless tobacco: None  . Alcohol Use: No    Review of Systems 10 Systems reviewed and are negative for acute change except as noted in the HPI.    Allergies  Tizanidine  Home Medications   Prior to Admission medications   Medication Sig Start Date End Date Taking? Authorizing Provider  aspirin 325 MG tablet Take 1 tablet (325 mg total) by mouth daily. 11/09/14  Yes Charolette Forward, MD  atorvastatin (LIPITOR) 40 MG tablet Take 1 tablet (40 mg total) by mouth daily at 6 PM. 11/09/14  Yes Charolette Forward, MD  busPIRone (BUSPAR) 10 MG tablet Take 10 mg by mouth 2 (two) times daily. Reported on 11/28/2015 03/02/14  Yes Historical Provider, MD  clonazePAM (KLONOPIN) 1 MG tablet TAKE 1 TABLET THREE TIMES A DAY AS NEEDED FOR ANXIETY 08/03/15  Yes Historical Provider, MD  docusate sodium (COLACE) 100 MG capsule Take 100 mg by mouth daily. 11/29/15  Yes Historical Provider, MD   doxepin (SINEQUAN) 10 MG capsule TAkE 1-2 CAPSULES BY MOUTH AT BEDTIME 07/07/15  Yes Historical Provider, MD  gabapentin (NEURONTIN) 300 MG capsule Take 300-600 mg by mouth 3 (three) times daily. 600mg  po am, 300mg  po noon 600mg  po pm 08/10/15  Yes Historical Provider, MD  hydrochlorothiazide (MICROZIDE) 12.5 MG capsule Take 12.5 mg by mouth daily. 01/03/15  Yes Historical Provider, MD  HYDROcodone-acetaminophen (NORCO) 7.5-325 MG per tablet Take 1 tablet by mouth 3 (three) times daily as needed. 01/23/15  Yes Historical Provider, MD  levETIRAcetam (KEPPRA) 500 MG tablet Take 500 mg by mouth 2 (two) times daily.   Yes Historical Provider, MD  Multiple Vitamin (MULTIVITAMIN WITH MINERALS) TABS tablet Take 1 tablet by mouth daily.   Yes Historical Provider, MD  nitrofurantoin (MACRODANTIN) 100 MG capsule Take 100 mg by mouth 2 (two) times daily. Started 12/14/15, for 7 days ending 12/21/15 12/13/15  Yes Historical Provider, MD  risperiDONE (RISPERDAL) 0.5 MG tablet Take 1 tablet (0.5 mg total) by mouth 2 (two) times daily. 11/09/14  Yes Charolette Forward, MD  senna-docusate (SENOKOT-S) 8.6-50 MG per tablet Take 1 tablet by mouth at bedtime as needed for mild constipation. 11/09/14  Yes Charolette Forward, MD  tamsulosin (FLOMAX) 0.4 MG CAPS capsule Take 0.4 mg by mouth every evening. 12/11/15  Yes Historical Provider, MD   BP 111/66 mmHg  Pulse 59  Temp(Src) 98 F (36.7 C) (Oral)  Resp 20  Ht 5\' 9"  (1.753  m)  Wt 291 lb 9.6 oz (132.269 kg)  BMI 43.04 kg/m2  SpO2 97% Physical Exam  Constitutional:  Patient is morbidly obese. Alert and nontoxic. No respiratory distress.  HENT:  Head: Normocephalic and atraumatic.  Cardiovascular: Normal rate, regular rhythm, normal heart sounds and intact distal pulses.   Pulmonary/Chest: Effort normal and breath sounds normal.  Abdominal: Soft. There is no tenderness.  Musculoskeletal: He exhibits no edema.  The patient does appear to have slight edema of the right lower  extremity relative to the left. 1+ pitting over the anterior tibia region. The calf is soft. No knee effusion. Pulses are 2+. No edema of the feet or ankles. She does have significantly obese lower extremities. Skin is warm and dry without erythema or signs of cellulitis.  Neurological: He is alert. Coordination normal.  Skin: Skin is warm and dry.  Psychiatric: He has a normal mood and affect.    ED Course  Procedures (including critical care time) Labs Review Labs Reviewed - No data to display  Imaging Review No results found. I have personally reviewed and evaluated these images and lab results as part of my medical decision-making.   EKG Interpretation None      MDM   Final diagnoses:  Swelling of right lower extremity   Ultrasound was not available at the time of the patient's evaluation. Patient has been given a dose of Lovenox with planned ultrasound in the morning as an outpatient. He does not have any signs of PE. No tachycardia no tachypnea and no dyspnea or chest pain. The swelling is very subtle but I do feel there is a slight difference from right to left. Patient will be given Lovenox with planned DVT study in the morning. Of note, the patient has started to walk and exercise more recently, this may be the etiology if DVT study is negative.    Charlesetta Shanks, MD 12/15/15 2153

## 2015-12-16 ENCOUNTER — Ambulatory Visit (HOSPITAL_COMMUNITY)
Admission: RE | Admit: 2015-12-16 | Discharge: 2015-12-16 | Disposition: A | Discharge status: Home / Self Care | Payer: Medicare Other | Source: Ambulatory Visit | Attending: Emergency Medicine | Admitting: Emergency Medicine

## 2015-12-16 DIAGNOSIS — M79609 Pain in unspecified limb: Secondary | ICD-10-CM

## 2015-12-16 DIAGNOSIS — R609 Edema, unspecified: Secondary | ICD-10-CM | POA: Insufficient documentation

## 2015-12-16 DIAGNOSIS — R52 Pain, unspecified: Secondary | ICD-10-CM | POA: Diagnosis not present

## 2015-12-16 NOTE — Progress Notes (Signed)
VASCULAR LAB PRELIMINARY  PRELIMINARY  PRELIMINARY  PRELIMINARY  Right lower extremity venous duplex completed.    Preliminary report:  There is no DVT or SVT noted in the right lower extremity.   Rewa Weissberg, RVT 12/16/2015, 8:32 AM

## 2016-01-03 ENCOUNTER — Encounter (HOSPITAL_COMMUNITY): Payer: Self-pay

## 2016-01-03 ENCOUNTER — Emergency Department (HOSPITAL_COMMUNITY)
Admission: EM | Admit: 2016-01-03 | Discharge: 2016-01-03 | Disposition: A | Discharge status: Home / Self Care | Payer: Medicare Other | Attending: Emergency Medicine | Admitting: Emergency Medicine

## 2016-01-03 ENCOUNTER — Emergency Department (HOSPITAL_COMMUNITY): Payer: Medicare Other

## 2016-01-03 DIAGNOSIS — E669 Obesity, unspecified: Secondary | ICD-10-CM | POA: Diagnosis not present

## 2016-01-03 DIAGNOSIS — I10 Essential (primary) hypertension: Secondary | ICD-10-CM | POA: Diagnosis not present

## 2016-01-03 DIAGNOSIS — Y92009 Unspecified place in unspecified non-institutional (private) residence as the place of occurrence of the external cause: Secondary | ICD-10-CM | POA: Insufficient documentation

## 2016-01-03 DIAGNOSIS — Y9301 Activity, walking, marching and hiking: Secondary | ICD-10-CM | POA: Diagnosis not present

## 2016-01-03 DIAGNOSIS — Z7982 Long term (current) use of aspirin: Secondary | ICD-10-CM | POA: Diagnosis not present

## 2016-01-03 DIAGNOSIS — W19XXXA Unspecified fall, initial encounter: Secondary | ICD-10-CM

## 2016-01-03 DIAGNOSIS — W01190A Fall on same level from slipping, tripping and stumbling with subsequent striking against furniture, initial encounter: Secondary | ICD-10-CM | POA: Insufficient documentation

## 2016-01-03 DIAGNOSIS — Z23 Encounter for immunization: Secondary | ICD-10-CM | POA: Diagnosis not present

## 2016-01-03 DIAGNOSIS — S50811A Abrasion of right forearm, initial encounter: Secondary | ICD-10-CM | POA: Diagnosis not present

## 2016-01-03 DIAGNOSIS — Y999 Unspecified external cause status: Secondary | ICD-10-CM | POA: Insufficient documentation

## 2016-01-03 DIAGNOSIS — Z79899 Other long term (current) drug therapy: Secondary | ICD-10-CM | POA: Insufficient documentation

## 2016-01-03 DIAGNOSIS — S060X0A Concussion without loss of consciousness, initial encounter: Secondary | ICD-10-CM | POA: Insufficient documentation

## 2016-01-03 DIAGNOSIS — E119 Type 2 diabetes mellitus without complications: Secondary | ICD-10-CM | POA: Diagnosis not present

## 2016-01-03 DIAGNOSIS — Z6841 Body Mass Index (BMI) 40.0 and over, adult: Secondary | ICD-10-CM | POA: Diagnosis not present

## 2016-01-03 DIAGNOSIS — M25551 Pain in right hip: Secondary | ICD-10-CM | POA: Diagnosis not present

## 2016-01-03 DIAGNOSIS — S0990XA Unspecified injury of head, initial encounter: Secondary | ICD-10-CM | POA: Diagnosis present

## 2016-01-03 LAB — I-STAT CHEM 8, ED
BUN: 21 mg/dL — ABNORMAL HIGH (ref 6–20)
CHLORIDE: 105 mmol/L (ref 101–111)
Calcium, Ion: 1.12 mmol/L — ABNORMAL LOW (ref 1.13–1.30)
Creatinine, Ser: 1.2 mg/dL (ref 0.61–1.24)
GLUCOSE: 110 mg/dL — AB (ref 65–99)
HEMATOCRIT: 40 % (ref 39.0–52.0)
HEMOGLOBIN: 13.6 g/dL (ref 13.0–17.0)
POTASSIUM: 3.5 mmol/L (ref 3.5–5.1)
SODIUM: 141 mmol/L (ref 135–145)
TCO2: 24 mmol/L (ref 0–100)

## 2016-01-03 MED ORDER — BACITRACIN ZINC 500 UNIT/GM EX OINT
TOPICAL_OINTMENT | Freq: Two times a day (BID) | CUTANEOUS | Status: DC
  Administered 2016-01-03: 1 via TOPICAL
  Filled 2016-01-03: qty 0.9

## 2016-01-03 MED ORDER — TETANUS-DIPHTH-ACELL PERTUSSIS 5-2.5-18.5 LF-MCG/0.5 IM SUSP
0.5000 mL | Freq: Once | INTRAMUSCULAR | Status: AC
  Administered 2016-01-03: 0.5 mL via INTRAMUSCULAR
  Filled 2016-01-03: qty 0.5

## 2016-01-03 MED ORDER — HYDROCODONE-ACETAMINOPHEN 5-325 MG PO TABS
1.0000 | ORAL_TABLET | Freq: Once | ORAL | Status: AC
  Administered 2016-01-03: 1 via ORAL
  Filled 2016-01-03: qty 1

## 2016-01-03 NOTE — ED Notes (Signed)
MD at bedside. 

## 2016-01-03 NOTE — ED Notes (Signed)
Patient transported to CT 

## 2016-01-03 NOTE — ED Notes (Signed)
This RN called CT to ask if he could go ahead and have his CT scan done. The CT tech stated they would do him next.

## 2016-01-03 NOTE — ED Notes (Signed)
Urinal left at bedside per pt request

## 2016-01-03 NOTE — Discharge Instructions (Signed)
Take your hydrocodone for pain. All x-rays and and CAT scan of your brain and neck showed no new injury. Wash the wound on your forearm daily with soap and water and place a thin layer of bacitracin ointment over the wound with a bandage after washing. Signs of infection include redness swelling or pain or drainage from the wound. See Dr.Spruill or an urgent care center if you feel that you may feet developing infection. Return if you feel worse for any reason.

## 2016-01-03 NOTE — ED Provider Notes (Signed)
CSN: [id number]     Arrival date & time 01/03/16  1459 History   First MD Initiated Contact with Patient 01/03/16 1618     Chief Complaint  Patient presents with  . Fall     (Consider location/radiation/quality/duration/timing/severity/associated sxs/prior Treatment) HPI fell on his home last night. He states he tripped over his feet while walking. He struck his head questionable loss of consciousness today he feels his speech is "slower than normal. He complains of mild right-sided parietal headache, posterior neck pain and right hip pain since the event. He also suffered an abrasion to his right forearm is resolved event fair. No treatment prior to coming here no other associated symptoms he comes today at the persuasion of his therapist whom he sees for bipolar disorder. Nothing makes symptoms better or worse. No nausea or vomiting. No visual changes. No other associated symptoms. . States pain mild to moderate at present. Treated himself with 1 hydrocodone tablet prior to coming here this morning 6 AM  Past Medical History  Diagnosis Date  . Hepatitis C   . Diabetes mellitus   . Hypertension   . Hyperlipidemia   Anxiety bipolar disorder.  History reviewed. No pertinent past surgical history. No family history on file. Social History  Substance Use Topics  . Smoking status: Never Smoker   . Smokeless tobacco: None  . Alcohol Use: No    Review of Systems  Musculoskeletal: Positive for back pain, arthralgias and gait problem.       Chronic back pain  Skin: Positive for wound.       Abrasion to left forearm  All other systems reviewed and are negative.     Allergies  Tizanidine  Home Medications   Prior to Admission medications   Medication Sig Start Date End Date Taking? Authorizing Provider  aspirin 325 MG tablet Take 1 tablet (325 mg total) by mouth daily. 11/09/14   Charolette Forward, MD  atorvastatin (LIPITOR) 40 MG tablet Take 1 tablet (40 mg total) by mouth daily at 6  PM. 11/09/14   Charolette Forward, MD  busPIRone (BUSPAR) 10 MG tablet Take 10 mg by mouth 2 (two) times daily. Reported on 11/28/2015 03/02/14   Historical Provider, MD  clonazePAM (KLONOPIN) 1 MG tablet TAKE 1 TABLET THREE TIMES A DAY AS NEEDED FOR ANXIETY 08/03/15   Historical Provider, MD  docusate sodium (COLACE) 100 MG capsule Take 100 mg by mouth daily. 11/29/15   Historical Provider, MD  doxepin (SINEQUAN) 10 MG capsule TAkE 1-2 CAPSULES BY MOUTH AT BEDTIME 07/07/15   Historical Provider, MD  gabapentin (NEURONTIN) 300 MG capsule Take 300-600 mg by mouth 3 (three) times daily. 600mg  po am, 300mg  po noon 600mg  po pm 08/10/15   Historical Provider, MD  hydrochlorothiazide (MICROZIDE) 12.5 MG capsule Take 12.5 mg by mouth daily. 01/03/15   Historical Provider, MD  HYDROcodone-acetaminophen (NORCO) 7.5-325 MG per tablet Take 1 tablet by mouth 3 (three) times daily as needed. 01/23/15   Historical Provider, MD  levETIRAcetam (KEPPRA) 500 MG tablet Take 500 mg by mouth 2 (two) times daily.    Historical Provider, MD  Multiple Vitamin (MULTIVITAMIN WITH MINERALS) TABS tablet Take 1 tablet by mouth daily.    Historical Provider, MD  nitrofurantoin (MACRODANTIN) 100 MG capsule Take 100 mg by mouth 2 (two) times daily. Started 12/14/15, for 7 days ending 12/21/15 12/13/15   Historical Provider, MD  risperiDONE (RISPERDAL) 0.5 MG tablet Take 1 tablet (0.5 mg total) by mouth 2 (two) times  daily. 11/09/14   Charolette Forward, MD  senna-docusate (SENOKOT-S) 8.6-50 MG per tablet Take 1 tablet by mouth at bedtime as needed for mild constipation. 11/09/14   Charolette Forward, MD  tamsulosin (FLOMAX) 0.4 MG CAPS capsule Take 0.4 mg by mouth every evening. 12/11/15   Historical Provider, MD   BP 121/90 mmHg  Pulse 100  Temp(Src) 99.1 F (37.3 C) (Oral)  Resp 18  Ht 5\' 9"  (1.753 m)  Wt 291 lb 7 oz (132.195 kg)  BMI 43.02 kg/m2  SpO2 95% Physical Exam  Constitutional: He is oriented to person, place, and time. He appears well-developed  and well-nourished. No distress.  HENT:  Head: Normocephalic and atraumatic.  Eyes: Conjunctivae are normal. Pupils are equal, round, and reactive to light.  Neck: Neck supple. No tracheal deviation present. No thyromegaly present.  No tenderness no bruit  Cardiovascular: Normal rate and regular rhythm.   No murmur heard. Pulmonary/Chest: Effort normal and breath sounds normal.  Obese  Abdominal: Soft. Bowel sounds are normal. He exhibits no distension. There is no tenderness.  Musculoskeletal: Normal range of motion. He exhibits no edema or tenderness.  Pelvis stable. Right lower extremity minimally tender at hip. No pain on internal or external rotation of thigh. DP pulse 2+. Good capillary refill. Right upper extremity with linear abrasion at volar forearm length of the forearm. No surrounding redness or swelling. Entire spine nontender. Pelvis stable. All other extremities without contusion abrasion or tenderness neurovascularly intact  Neurological: He is alert and oriented to person, place, and time. No cranial nerve deficit. Coordination normal.  Skin: Skin is warm and dry. No rash noted.  Psychiatric: He has a normal mood and affect.  Nursing note and vitals reviewed.   ED Course  Procedures (including critical care time) Labs Review Labs Reviewed  I-STAT CHEM 8, ED    Imaging Review No results found. I have personally reviewed and evaluated these images and lab results as part of my medical decision-making.   EKG Interpretation None     7:20 PM patient feels well complains only of low back pain which is chronic. He ambulates without difficulty without with his cane. No lightheadedness on standing. Results for orders placed or performed during the hospital encounter of 01/03/16  I-stat chem 8, ed  Result Value Ref Range   Sodium 141 135 - 145 mmol/L   Potassium 3.5 3.5 - 5.1 mmol/L   Chloride 105 101 - 111 mmol/L   BUN 21 (H) 6 - 20 mg/dL   Creatinine, Ser 1.20 0.61  - 1.24 mg/dL   Glucose, Bld 110 (H) 65 - 99 mg/dL   Calcium, Ion 1.12 (L) 1.13 - 1.30 mmol/L   TCO2 24 0 - 100 mmol/L   Hemoglobin 13.6 13.0 - 17.0 g/dL   HCT 40.0 39.0 - 52.0 %   Ct Head Wo Contrast  01/03/2016  CLINICAL DATA:  Fall this morning, right side neck pain EXAM: CT HEAD WITHOUT CONTRAST CT CERVICAL SPINE WITHOUT CONTRAST TECHNIQUE: Multidetector CT imaging of the head and cervical spine was performed following the standard protocol without intravenous contrast. Multiplanar CT image reconstructions of the cervical spine were also generated. COMPARISON:  11/06/2014 cervical spine CT 11/02/2014 FINDINGS: CT HEAD FINDINGS No skull fracture is noted. Paranasal sinuses and mastoid air cells are unremarkable. No intracranial hemorrhage, mass effect or midline shift. Stable mild cerebral atrophy. Stable mild periventricular chronic white matter disease. No acute cortical infarction. No mass lesion is noted on this unenhanced scan. CT  CERVICAL SPINE FINDINGS Axial images of the cervical spine shows no acute fracture or subluxation. Again noted degenerative changes C1-C2 articulation. Again noted anterior spurring at C4-C5 level. Large anterior bridging osteophytes at C5-C6 and C6-C7 are again noted. No prevertebral soft tissue swelling. Again noticed Schmorl's node deformity upper endplate of C7 vertebral body. No prevertebral soft tissue swelling. Cervical airway is patent. Multilevel mild facet degenerative changes are stable. IMPRESSION: 1. No acute intracranial abnormality. Stable atrophy and mild periventricular chronic white matter disease. 2. No cervical spine acute fracture or subluxation. Stable degenerative changes as described above. Electronically Signed   By: Lahoma Crocker M.D.   On: 01/03/2016 17:07   Ct Cervical Spine Wo Contrast  01/03/2016  CLINICAL DATA:  Fall this morning, right side neck pain EXAM: CT HEAD WITHOUT CONTRAST CT CERVICAL SPINE WITHOUT CONTRAST TECHNIQUE: Multidetector CT  imaging of the head and cervical spine was performed following the standard protocol without intravenous contrast. Multiplanar CT image reconstructions of the cervical spine were also generated. COMPARISON:  11/06/2014 cervical spine CT 11/02/2014 FINDINGS: CT HEAD FINDINGS No skull fracture is noted. Paranasal sinuses and mastoid air cells are unremarkable. No intracranial hemorrhage, mass effect or midline shift. Stable mild cerebral atrophy. Stable mild periventricular chronic white matter disease. No acute cortical infarction. No mass lesion is noted on this unenhanced scan. CT CERVICAL SPINE FINDINGS Axial images of the cervical spine shows no acute fracture or subluxation. Again noted degenerative changes C1-C2 articulation. Again noted anterior spurring at C4-C5 level. Large anterior bridging osteophytes at C5-C6 and C6-C7 are again noted. No prevertebral soft tissue swelling. Again noticed Schmorl's node deformity upper endplate of C7 vertebral body. No prevertebral soft tissue swelling. Cervical airway is patent. Multilevel mild facet degenerative changes are stable. IMPRESSION: 1. No acute intracranial abnormality. Stable atrophy and mild periventricular chronic white matter disease. 2. No cervical spine acute fracture or subluxation. Stable degenerative changes as described above. Electronically Signed   By: Lahoma Crocker M.D.   On: 01/03/2016 17:07   Dg Hip Unilat With Pelvis 2-3 Views Right  01/03/2016  CLINICAL DATA:  Fall yesterday with lateral hip pain, initial encounter EXAM: DG HIP (WITH OR WITHOUT PELVIS) 2-3V RIGHT COMPARISON:  None. FINDINGS: Pelvic ring is intact. Degenerative changes of the pubic symphysis are seen. No acute fracture or dislocation is noted. No soft tissue changes are seen. IMPRESSION: No acute abnormality noted. Electronically Signed   By: Inez Catalina M.D.   On: 01/03/2016 17:11    MDM  Plan local wound care to forearm. He has hydrocodone which he can take at  home. Diagnoses #1 fall #2 minor concussion #3 abrasion to right forearm #4 contusions multiple sites Final diagnoses:  Clint Guy, MD 01/03/16 1929

## 2016-01-03 NOTE — ED Notes (Signed)
Pt states he fell last night and hit his head on the corner of his dresser. He reports right hip/back pain and has laceration with dried blood to right posterior forearm. He states he feels more "slow than normal." Speech is clear.  He is A&Ox4, ambulatory. Denies N/V, headache.

## 2016-01-22 ENCOUNTER — Other Ambulatory Visit (HOSPITAL_COMMUNITY): Payer: Self-pay | Admitting: General Surgery

## 2016-01-22 DIAGNOSIS — Z6841 Body Mass Index (BMI) 40.0 and over, adult: Principal | ICD-10-CM

## 2016-01-28 ENCOUNTER — Ambulatory Visit: Payer: Self-pay | Admitting: Dietician

## 2016-01-30 ENCOUNTER — Encounter: Payer: Self-pay | Admitting: Skilled Nursing Facility1

## 2016-01-30 ENCOUNTER — Encounter: Payer: Medicare Other | Attending: Cardiology | Admitting: Skilled Nursing Facility1

## 2016-01-30 VITALS — Ht 69.0 in | Wt 284.0 lb

## 2016-01-30 DIAGNOSIS — E119 Type 2 diabetes mellitus without complications: Secondary | ICD-10-CM | POA: Insufficient documentation

## 2016-01-30 DIAGNOSIS — E669 Obesity, unspecified: Secondary | ICD-10-CM

## 2016-01-30 NOTE — Progress Notes (Addendum)
  Pre-Op Assessment Visit:  Pre-Operative Sleeve Gastrectomy Surgery  Medical Nutrition Therapy:  Appt start time: 1500   End time:  1600.  Patient was seen on 01/30/2016 for Pre-Operative Nutrition Assessment. Assessment and letter of approval faxed to [facility] coordinator on 01/30/2016.  Pt returns having met with Antonieta Iba RD,LDN for diabetes education. Pt returns having lost 5 pounds. Pt states his blood sugars have been running about 110 an hour after he eats. Pt states his sister and cousin had the sleeve gastrectomy and have been successful.  Pt states he has been drinking ensure for breakfast.  Pt is seemingly cognitively stable as he was mentally sharp there was only a delayed reaction time and slowed physical movement which did not impair his ability to understand.  Diet Recall: Cereal Sandwich Chicken salad and greens Beverages: soda, water, tea, coffee  -Follow diet recommendations listed below   Energy and Macronutrient Recomendations: Calories: 1800 Carbohydrate: 200 Protein: 135 Fat: 50  Encouraged to engage in 50 minutes of moderate physical activity including cardiovascular and weight baring weekly  Preferred Learning Style: Hands on  Learning Readiness:  Ready  Handouts given during visit include:  Pre-Op Goals Bariatric Surgery Protein Shakes  During the appointment today the following Pre-Op Goals were reviewed with the patient: Maintain or lose weight as instructed by your surgeon Make healthy food choices Begin to limit portion sizes Limited concentrated sugars and fried foods Keep fat/sugar in the single digits per serving on  food labels Practice CHEWING your food  (aim for 30 chews per bite or until applesauce consistency) Practice not drinking 15 minutes before, during, and 30 minutes after each meal/snack Avoid all carbonated beverages  Avoid/limit caffeinated beverages  Avoid all sugar-sweetened  beverages Consume 3 meals per day; eat every 3-5 hours Make a list of non-food related activities Aim for 64-100 ounces of FLUID daily  Aim for at least 60-80 grams of PROTEIN daily Look for a liquid protein source that contain ?15 g protein and ?5 g carbohydrate  (ex: shakes, drinks, shots)  Patient-Centered Goals: 10/10 specific/non-scale and confidence/importance scale 1-10  Demonstrated degree of understanding via:  Teach Back  Teaching Method Utilized:  Visual Auditory Hands on  Barriers to learning/adherence to lifestyle change: none identified   Patient to call the [facility] to enroll in Pre-Op and Post-Op Nutrition Education when surgery date is scheduled.

## 2016-01-30 NOTE — Patient Instructions (Addendum)
Follow Pre-Op Goals Try Protein Shakes Call [facility] at [phone] when surgery is scheduled to enroll in Pre-Op Class  Things to remember:  Please always be honest with [place]. We want to support you!  If you have any questions or concerns in between appointments, please call or email Ferol Luz, or Margarita Grizzle.  The diet after surgery will be high protein and low in carbohydrate.  Vitamins and calcium need to be taken for the rest of your life.  Feel free to include support people in any classes or appointments.  Try to get canned vegetables with a pop top and heat them up in the microwave   Supplement recommendations:  Complete" Multivitamin: Sleeve Gastrectomy and RYGB patients take a double dose of MVI. LAGB patients take single dose as it is written on the package. Vitamin must be liquid or chewable but not gummy. Examples of these include Flintstones Complete and Centrum Complete. If the vitamin is bariatric-specific, take 1 dose as it is already formulated for bariatric surgery patients. Examples of these are Bariatric Advantage, Celebrate, and [employer]. These can be found at the [facility] and/or online.     Calcium citrate: 1500 mg/day of Calcium citrate (also chewable or liquid) is recommended for all procedures. The body is only able to absorb 500-600 mg of Calcium at one time so 3 daily doses of 500 mg are recommended. Calcium doses must be taken a minimum of 2 hours apart. Additionally, Calcium must be taken 2 hours apart from iron-containing MVI. Examples of brands include Celebrate, Bariatric Advantage, and Wellesse. These brands must be purchased online or at the [facility]. Citracal Petites is the only Calcium citrate supplement found in general grocery stores and pharmacies. This is in tablet form and may be recommended for patients who do not tolerate chewable Calcium.  Continued or added Vitamin D supplementation based on individual needs.     Vitamin B12: 300-500 mcg/day for Sleeve Gastrectomy and RYGB. Optional for LAGB patients as stomach remains fully intact. Must be taken intramuscularly, sublingually, or inhaled nasally. Oral route is not recommended.

## 2016-02-11 ENCOUNTER — Ambulatory Visit: Payer: Medicare Other | Admitting: Nurse Practitioner

## 2016-02-18 ENCOUNTER — Ambulatory Visit (INDEPENDENT_AMBULATORY_CARE_PROVIDER_SITE_OTHER): Payer: Medicare Other | Admitting: Nurse Practitioner

## 2016-02-18 ENCOUNTER — Encounter: Payer: Self-pay | Admitting: Nurse Practitioner

## 2016-02-18 VITALS — BP 129/82 | HR 94 | Ht 69.0 in | Wt 278.4 lb

## 2016-02-18 DIAGNOSIS — E1159 Type 2 diabetes mellitus with other circulatory complications: Secondary | ICD-10-CM | POA: Diagnosis not present

## 2016-02-18 DIAGNOSIS — G40909 Epilepsy, unspecified, not intractable, without status epilepticus: Secondary | ICD-10-CM

## 2016-02-18 DIAGNOSIS — E785 Hyperlipidemia, unspecified: Secondary | ICD-10-CM

## 2016-02-18 DIAGNOSIS — G451 Carotid artery syndrome (hemispheric): Secondary | ICD-10-CM

## 2016-02-18 NOTE — Progress Notes (Signed)
[facility]  PATIENT: Terry Baxter DOB: [birth year]   REASON FOR VISIT: Follow-up for TIA,  hyperlipidemia, seizure disorder, diabetes, bipolar disorder HISTORY FROM: Patient    HISTORY OF PRESENT ILLNESS: HISTORY JXMr. Terry Baxter is a 70 y.o. male with history of HTN, DM, hepatitis C, and seizures was admitted on 11/06/14 for left face weakness, dysarthria, unsteadiness. Symptoms resolved quickly and MRI negative for acute stroke. Stroke w/u including MRA, CUS, TTE were negative. LDL 127, put on lipitor. A1c 6.1. Continue ASA and keppra. He was discharged home in good condition.  Interval History7/16/16 JXDuring the interval time, the patient has been doing well. He walks 0.5 mile daily. No recurrent symptoms. BP at home 115/76 and in clinic 101/72. Glucose at home ranging from 78-98. He has no seizure since 2014. He will see PCP on 02/13/15. He walks with cane. UPDATE 1/17/17CM Terry Baxter, 70 year old male returns for follow-up. He has previously been seen in this office by [name]. He has a history of left facial weakness dysarthria and unsteadiness on 11/06/2014 and his symptoms resolved quickly stroke workup was negative. He also has a history of hypertension hyperlipidemia and diabetes which are in good control according to the patient. His blood pressure in the office today is 119/85. CBC checked yesterday was 114. He remains active by going to the [facility] 3 times a week to walk. He does not drive, he uses public transportation. He also has history of bipolar disorder and sees [name]  every 2 months. Patient denies further stroke or TIA symptoms. Labs followed by PCP [name]. He returns for reevaluation UPDATE 07/24/2017CM Terry Baxter, 70 year old male returns for follow-up. He has a history of left facial weakness dysarthria and unsteadiness in April 2016. His symptoms quickly resolved stroke workup was negative. He has a history of hypertension and blood pressure  08/16/1980 in the office today in addition he has diabetic with recent hemoglobin A1c of 6 in April 2017 T needs to be active. He uses public transportation. He has bipolar depression and sees [name] every 2 months. Patient denies further stroke or TIA symptoms, his labs are followed by [name] his primary care.  He has history of seizure disorder with last seizure occurring in 2014. Keppra is refilled by [name]. He returns for reevaluation. He goes to a pain center for chronic pain. REVIEW OF SYSTEMS: Full 14 system review of systems performed and notable only for those listed, all others are neg:  Constitutional: Fatigue  Cardiovascular: neg Ear/Nose/Throat: neg  Skin: neg Eyes: neg Respiratory: Shortness of breath Gastroitestinal: Urinary frequency Hematology/Lymphatic: neg  Endocrine: neg Musculoskeletal: Joint pain, walking difficulty Allergy/Immunology: neg Neurological: Seizure disorder Psychiatric: Depression and anxiety bipolar disorder Sleep : Frequent wakening   ALLERGIES: Allergies  Allergen Reactions  . Tizanidine Other (See Comments)    dizziness    HOME MEDICATIONS: Outpatient Medications Prior to Visit  Medication Sig Dispense Refill  . aspirin 325 MG tablet Take 1 tablet (325 mg total) by mouth daily. 30 tablet 3  . atorvastatin (LIPITOR) 40 MG tablet Take 1 tablet (40 mg total) by mouth daily at 6 PM. 30 tablet 3  . busPIRone (BUSPAR) 10 MG tablet Take 10 mg by mouth 2 (two) times daily. Reported on 11/28/2015    . docusate sodium (COLACE) 100 MG capsule Take 100 mg by mouth daily.  4  . doxepin (SINEQUAN) 10 MG capsule TAkE 1-2 CAPSULES BY MOUTH AT BEDTIME  1  . hydrochlorothiazide (  MICROZIDE) 12.5 MG capsule Take 12.5 mg by mouth daily.  4  . levETIRAcetam (KEPPRA) 500 MG tablet Take 500 mg by mouth 2 (two) times daily.    . Multiple Vitamin (MULTIVITAMIN WITH MINERALS) TABS tablet Take 1 tablet by mouth daily.    Marland Kitchen senna-docusate (SENOKOT-S) 8.6-50  MG per tablet Take 1 tablet by mouth at bedtime as needed for mild constipation. 30 tablet 3  . tamsulosin (FLOMAX) 0.4 MG CAPS capsule Take 0.4 mg by mouth every evening.    . clonazePAM (KLONOPIN) 1 MG tablet TAKE 1 TABLET THREE TIMES A DAY AS NEEDED FOR ANXIETY  1  . gabapentin (NEURONTIN) 300 MG capsule Take 300-600 mg by mouth 3 (three) times daily. 600mg  po am, 300mg  po noon 600mg  po pm    . HYDROcodone-acetaminophen (NORCO) 7.5-325 MG per tablet Take 1 tablet by mouth 3 (three) times daily as needed.  0  . nitrofurantoin (MACRODANTIN) 100 MG capsule Take 100 mg by mouth 2 (two) times daily. Started 12/14/15, for 7 days ending 12/21/15    . risperiDONE (RISPERDAL) 0.5 MG tablet Take 1 tablet (0.5 mg total) by mouth 2 (two) times daily. (Patient not taking: Reported on 02/18/2016) 60 tablet 3   No facility-administered medications prior to visit.     PAST MEDICAL HISTORY: Past Medical History:  Diagnosis Date  . Diabetes mellitus   . Hepatitis C   . Hyperlipidemia   . Hypertension     PAST SURGICAL HISTORY: No past surgical history on file.  FAMILY HISTORY: No family history on file.  SOCIAL HISTORY: Social History   Social History  . Marital status: Legally Separated    Spouse name: N/A  . Number of children: N/A  . Years of education: N/A   Occupational History  . retired    Social History Main Topics  . Smoking status: Never Smoker  . Smokeless tobacco: Not on file  . Alcohol use No  . Drug use: No     Comment: h/o heroin abuse, marijuana  . Sexual activity: Not on file   Other Topics Concern  . Not on file   Social History Narrative   Lives with room mate at home    Never married    Has 2 children, 4 years of college    1 cup of caffeine a week     PHYSICAL EXAM  Vitals:   02/18/16 1449  BP: 129/82  Pulse: 94  Weight: 278 lb 6.4 oz (126.3 kg)  Height: 5\' 9"  (1.753 m)   Body mass index is 41.11 kg/m. General - obese, well developed, in no  apparent distress. Cardiovascular - Regular rate and rhythm with no murmur. Neck supple no carotid bruits  Mental Status -  Level of arousal and orientation to time, place, and person were intact. Language including expression, naming, repetition, comprehension was assessed and found intact. Fund of Knowledge was assessed and was intact.MMSE 29/30 AFT 9.   Cranial Nerves II - XII - II - Visual field intact OU.Fundi not visualized due to eye movement. III, IV, VI - Extraocular movements intact. V - Facial sensation intact bilaterally. VII - Facial movement intact bilaterally. VIII - Hearing & vestibular intact bilaterally. X - Palate elevates symmetrically. XI - Chin turning & shoulder shrug intact bilaterally. XII - Tongue protrusion intact.  Motor Strength - The patient's strength was normal in all extremities and pronator drift was absent. Bulk and tone was normal . Reflexes - The patient's reflexes were 1+ in  all extremities  Sensory - Light touch, temperature/pinprick were assessed and were normal.  Coordination - The patient had normal movements in the hands and feet with no ataxia or dysmetria. Tremor was absent. Gait and Station - walk with cane, stable and normal stride.No difficulty with turns DIAGNOSTIC DATA (LABS, IMAGING, TESTING) - I reviewed patient records, labs, notes, testing and imaging myself where available.  Lab Results  Component Value Date   WBC 10.2 10/31/2015   HGB 13.6 01/03/2016   HCT 40.0 01/03/2016   MCV 94.0 10/31/2015   PLT 187 10/31/2015      Component Value Date/Time   NA 141 01/03/2016 1732   K 3.5 01/03/2016 1732   CL 105 01/03/2016 1732   CO2 23 10/31/2015 1340   GLUCOSE 110 (H) 01/03/2016 1732   BUN 21 (H) 01/03/2016 1732   CREATININE 1.20 01/03/2016 1732   CALCIUM 9.9 10/31/2015 1340   PROT 8.9 (H) 10/31/2015 1340   ALBUMIN 4.1 10/31/2015 1340   AST 28 10/31/2015 1340   ALT 13 (L) 10/31/2015 1340   ALKPHOS 77 10/31/2015 1340     BILITOT 0.8 10/31/2015 1340   GFRNONAA 48 (L) 10/31/2015 1340   GFRAA 56 (L) 10/31/2015 1340   Lab Results  Component Value Date   CHOL 193 11/07/2014   HDL 55 11/07/2014   LDLCALC 127 (H) 11/07/2014   TRIG 53 11/07/2014   CHOLHDL 3.5 11/07/2014   Lab Results  Component Value Date   HGBA1C 6.0 (H) 10/31/2015   Mr Brain Wo Contrast 11/06/2014 Mild chronic microvascular ischemia. No acute infarct   Mr Virgel Paling Wo Contrast 11/06/2014 Limited MRA of the brain due to motion. No large vessel occlusion.   CUS - 1-39% stenosis bilaterally. Vertebral arteries are patent and antegrade bilaterally.  EEG - Normal awake electroencephalogram with activation procedures. There are no focal lateralizing or epileptiform features.  ASSESSMENT AND PLAN 70 y.o. African American male with PMH of HTN, DM, hepatitis C, and seizures and hyperlipidemia was admitted on 11/06/14 for possible TIA episode. Stroke w/u including MRI, MRA, CUS, TTE were all negative. LDL 127, put on lipitor. A1c 6.1. Continue ASA and keppra. He has been doing well during the interval time. BP and glucose in control.     PLAN: Management of risk factors to prevent  repeat stroke/TIA continue ASA and lipitor for stroke prevention Labs followed by [name] Continue keppra for seizure control last seizure 2014 B/P 129/82 in the office Diabetes in good control most recent hemoglobin A1c 6  Follow up with your primary care physician for stroke risk factor modification. Recommend maintain blood pressure goal <130/80, diabetes with hemoglobin A1c goal below 6.5% and lipids with LDL cholesterol goal below 70 mg/dL.  healthy diet and regular exercise continue walking at the [facility] Continue follow up with [name], psych RTC in 1 year.If continues to be stable will discharge at that time  Dennie Bible, [facility], [facility], [facility] [address], [place], [place] [address] [phone]

## 2016-02-18 NOTE — Patient Instructions (Signed)
continue ASA and lipitor for stroke prevention Labs followed by [name] Continue keppra for seizure control last seizure 2014 B/P 129/82 in the office Diabetes in good control most recent hemoglobin A1c 6  Follow up with your primary care physician for stroke risk factor modification. Recommend maintain blood pressure goal <130/80, diabetes with hemoglobin A1c goal below 6.5% and lipids with LDL cholesterol goal below 70 mg/dL.  healthy diet and regular exercise continue walking at the [facility] Continue follow up with [name], psych RTC in 1 year.If continues to be stable will discharge at that time

## 2016-02-18 NOTE — Progress Notes (Signed)
I reviewed above note and agree with the assessment and plan.  Rosalin Hawking, MD PhD Stroke Neurology 02/18/2016 7:19 PM

## 2016-02-21 ENCOUNTER — Ambulatory Visit (HOSPITAL_COMMUNITY)
Admission: RE | Admit: 2016-02-21 | Discharge: 2016-02-21 | Disposition: A | Discharge status: Home / Self Care | Payer: Medicare Other | Source: Ambulatory Visit | Attending: General Surgery | Admitting: General Surgery

## 2016-02-21 DIAGNOSIS — K224 Dyskinesia of esophagus: Secondary | ICD-10-CM | POA: Insufficient documentation

## 2016-02-21 DIAGNOSIS — Z6841 Body Mass Index (BMI) 40.0 and over, adult: Secondary | ICD-10-CM | POA: Insufficient documentation

## 2016-02-21 DIAGNOSIS — K449 Diaphragmatic hernia without obstruction or gangrene: Secondary | ICD-10-CM | POA: Diagnosis not present

## 2016-02-21 DIAGNOSIS — Z01818 Encounter for other preprocedural examination: Secondary | ICD-10-CM | POA: Insufficient documentation

## 2016-03-18 ENCOUNTER — Ambulatory Visit (INDEPENDENT_AMBULATORY_CARE_PROVIDER_SITE_OTHER): Payer: Medicare Other | Admitting: Psychiatry

## 2016-03-18 DIAGNOSIS — E661 Drug-induced obesity: Secondary | ICD-10-CM

## 2016-03-20 ENCOUNTER — Other Ambulatory Visit: Payer: Self-pay | Admitting: Urology

## 2016-04-17 ENCOUNTER — Ambulatory Visit (INDEPENDENT_AMBULATORY_CARE_PROVIDER_SITE_OTHER): Payer: Medicare Other | Admitting: Psychiatry

## 2016-04-23 ENCOUNTER — Encounter (HOSPITAL_BASED_OUTPATIENT_CLINIC_OR_DEPARTMENT_OTHER): Payer: Self-pay | Admitting: *Deleted

## 2016-04-23 NOTE — Progress Notes (Signed)
NPO AFTER MN.  ARRIVE AT [id number].  NEEDS ISTAT.  CURRENT EKG IN CHART AND EPIC.  WILL TAKE AM MEDS W/ SIPS OF WATER.  PT STATED THAT HE WAS TAKING TAXI DOS , DROP-OFF AND PICK-UP) WITHOUT RESPONSIBLE PERSON.  ADVISED PT OF OUR  POLICY AT DISCHARGE TO HAVE RESPONSIBLE PERSON >68 YRS OLD .   AFTER SPEAKING/ HELPING HIM THINK OF PERSON , ASKED HIM ABOUT IF HE HAD A EMERGENCY CONTACT PERSON, WHICH IS LISTED IN EPIC ,  HE STATED HIS FRIEND Terry Baxter BUT HE WOULD NOT BE BACK IN TOWN UNTIL LATER TONIGHT.  PT VERBALIZED UNDERSTANDING OF PLAN THAT HE COULD BE DROPPED OFF BY TAXI AND HIS FRIEND COULD PICK HIM UP AT DISCHARGE, IF HE CAN,  I AM TO CALL PT BACK TOMORROW TO CONFIRM HIS FRIEND CAN AND WILL PICK HIM UP DOS.

## 2016-04-25 ENCOUNTER — Ambulatory Visit (HOSPITAL_BASED_OUTPATIENT_CLINIC_OR_DEPARTMENT_OTHER): Payer: Medicare Other | Admitting: Anesthesiology

## 2016-04-25 ENCOUNTER — Ambulatory Visit (HOSPITAL_BASED_OUTPATIENT_CLINIC_OR_DEPARTMENT_OTHER)
Admission: RE | Admit: 2016-04-25 | Discharge: 2016-04-25 | Disposition: A | Discharge status: Home / Self Care | Payer: Medicare Other | Source: Ambulatory Visit | Attending: Urology | Admitting: Urology

## 2016-04-25 ENCOUNTER — Encounter (HOSPITAL_BASED_OUTPATIENT_CLINIC_OR_DEPARTMENT_OTHER): Payer: Self-pay | Admitting: *Deleted

## 2016-04-25 ENCOUNTER — Encounter (HOSPITAL_BASED_OUTPATIENT_CLINIC_OR_DEPARTMENT_OTHER)
Admission: RE | Disposition: A | Discharge status: Home / Self Care | Payer: Self-pay | Source: Ambulatory Visit | Attending: Urology

## 2016-04-25 DIAGNOSIS — Z8673 Personal history of transient ischemic attack (TIA), and cerebral infarction without residual deficits: Secondary | ICD-10-CM | POA: Insufficient documentation

## 2016-04-25 DIAGNOSIS — Z6841 Body Mass Index (BMI) 40.0 and over, adult: Secondary | ICD-10-CM | POA: Diagnosis not present

## 2016-04-25 DIAGNOSIS — N476 Balanoposthitis: Secondary | ICD-10-CM | POA: Diagnosis not present

## 2016-04-25 DIAGNOSIS — G40909 Epilepsy, unspecified, not intractable, without status epilepticus: Secondary | ICD-10-CM | POA: Diagnosis not present

## 2016-04-25 DIAGNOSIS — F319 Bipolar disorder, unspecified: Secondary | ICD-10-CM | POA: Diagnosis not present

## 2016-04-25 DIAGNOSIS — N478 Other disorders of prepuce: Secondary | ICD-10-CM | POA: Insufficient documentation

## 2016-04-25 DIAGNOSIS — M1990 Unspecified osteoarthritis, unspecified site: Secondary | ICD-10-CM | POA: Insufficient documentation

## 2016-04-25 DIAGNOSIS — N182 Chronic kidney disease, stage 2 (mild): Secondary | ICD-10-CM | POA: Insufficient documentation

## 2016-04-25 DIAGNOSIS — E119 Type 2 diabetes mellitus without complications: Secondary | ICD-10-CM | POA: Insufficient documentation

## 2016-04-25 DIAGNOSIS — E785 Hyperlipidemia, unspecified: Secondary | ICD-10-CM | POA: Diagnosis not present

## 2016-04-25 DIAGNOSIS — N471 Phimosis: Secondary | ICD-10-CM | POA: Diagnosis present

## 2016-04-25 DIAGNOSIS — G8929 Other chronic pain: Secondary | ICD-10-CM | POA: Diagnosis not present

## 2016-04-25 DIAGNOSIS — I129 Hypertensive chronic kidney disease with stage 1 through stage 4 chronic kidney disease, or unspecified chronic kidney disease: Secondary | ICD-10-CM | POA: Diagnosis not present

## 2016-04-25 HISTORY — DX: Phimosis: N47.1

## 2016-04-25 HISTORY — DX: Unspecified urinary incontinence: R32

## 2016-04-25 HISTORY — DX: Personal history of other infectious and parasitic diseases: Z86.19

## 2016-04-25 HISTORY — DX: Unspecified convulsions: R56.9

## 2016-04-25 HISTORY — DX: Low back pain: M54.5

## 2016-04-25 HISTORY — DX: Presence of dental prosthetic device (complete) (partial): Z97.2

## 2016-04-25 HISTORY — DX: Personal history of other diseases of male genital organs: Z87.438

## 2016-04-25 HISTORY — DX: Type 2 diabetes mellitus without complications: E11.9

## 2016-04-25 HISTORY — DX: Inflammatory liver disease, unspecified: K75.9

## 2016-04-25 HISTORY — DX: Nocturia: R35.1

## 2016-04-25 HISTORY — DX: Opioid abuse, in remission: F11.11

## 2016-04-25 HISTORY — DX: Chronic kidney disease, stage 2 (mild): N18.2

## 2016-04-25 HISTORY — DX: Low back pain, unspecified: M54.50

## 2016-04-25 HISTORY — DX: Presence of spectacles and contact lenses: Z97.3

## 2016-04-25 HISTORY — PX: CIRCUMCISION: SHX1350

## 2016-04-25 HISTORY — DX: Personal history of transient ischemic attack (TIA), and cerebral infarction without residual deficits: Z86.73

## 2016-04-25 HISTORY — DX: Other chronic pain: G89.29

## 2016-04-25 HISTORY — DX: Epilepsy, unspecified, not intractable, without status epilepticus: G40.909

## 2016-04-25 HISTORY — DX: Bipolar disorder, unspecified: F31.9

## 2016-04-25 HISTORY — DX: Unspecified osteoarthritis, unspecified site: M19.90

## 2016-04-25 LAB — POCT I-STAT 4, (NA,K, GLUC, HGB,HCT)
GLUCOSE: 104 mg/dL — AB (ref 65–99)
HEMATOCRIT: 41 % (ref 39.0–52.0)
HEMOGLOBIN: 13.9 g/dL (ref 13.0–17.0)
POTASSIUM: 3.8 mmol/L (ref 3.5–5.1)
Sodium: 143 mmol/L (ref 135–145)

## 2016-04-25 LAB — GLUCOSE, CAPILLARY
GLUCOSE-CAPILLARY: 92 mg/dL (ref 65–99)
Glucose-Capillary: 103 mg/dL — ABNORMAL HIGH (ref 65–99)

## 2016-04-25 SURGERY — CIRCUMCISION, ADULT
Anesthesia: General | Site: Penis

## 2016-04-25 MED ORDER — FLUCONAZOLE IN SODIUM CHLORIDE 100-0.9 MG/50ML-% IV SOLN
100.0000 mg | Freq: Once | INTRAVENOUS | Status: AC
  Administered 2016-04-25: 100 mg via INTRAVENOUS
  Filled 2016-04-25 (×2): qty 50

## 2016-04-25 MED ORDER — CLINDAMYCIN PHOSPHATE 900 MG/50ML IV SOLN
900.0000 mg | INTRAVENOUS | Status: AC
  Administered 2016-04-25: 900 mg via INTRAVENOUS
  Filled 2016-04-25: qty 50

## 2016-04-25 MED ORDER — BUPIVACAINE HCL 0.25 % IJ SOLN
INTRAMUSCULAR | Status: DC | PRN
  Administered 2016-04-25: 10 mL

## 2016-04-25 MED ORDER — PHENYLEPHRINE 40 MCG/ML (10ML) SYRINGE FOR IV PUSH (FOR BLOOD PRESSURE SUPPORT)
PREFILLED_SYRINGE | INTRAVENOUS | Status: DC | PRN
  Administered 2016-04-25: 80 ug via INTRAVENOUS

## 2016-04-25 MED ORDER — CLINDAMYCIN PHOSPHATE 900 MG/50ML IV SOLN
INTRAVENOUS | Status: AC
  Filled 2016-04-25: qty 50

## 2016-04-25 MED ORDER — LIDOCAINE 2% (20 MG/ML) 5 ML SYRINGE
INTRAMUSCULAR | Status: DC | PRN
  Administered 2016-04-25: 100 mg via INTRAVENOUS

## 2016-04-25 MED ORDER — PROPOFOL 10 MG/ML IV BOLUS
INTRAVENOUS | Status: AC
  Filled 2016-04-25: qty 20

## 2016-04-25 MED ORDER — FENTANYL CITRATE (PF) 100 MCG/2ML IJ SOLN
25.0000 ug | INTRAMUSCULAR | Status: DC | PRN
  Filled 2016-04-25: qty 1

## 2016-04-25 MED ORDER — FENTANYL CITRATE (PF) 100 MCG/2ML IJ SOLN
INTRAMUSCULAR | Status: DC | PRN
  Administered 2016-04-25: 50 ug via INTRAVENOUS
  Administered 2016-04-25: 25 ug via INTRAVENOUS

## 2016-04-25 MED ORDER — LACTATED RINGERS IV SOLN
INTRAVENOUS | Status: DC
  Administered 2016-04-25: 09:00:00 via INTRAVENOUS
  Filled 2016-04-25: qty 1000

## 2016-04-25 MED ORDER — PROPOFOL 10 MG/ML IV BOLUS
INTRAVENOUS | Status: DC | PRN
  Administered 2016-04-25: 200 mg via INTRAVENOUS

## 2016-04-25 MED ORDER — FENTANYL CITRATE (PF) 100 MCG/2ML IJ SOLN
INTRAMUSCULAR | Status: AC
  Filled 2016-04-25: qty 2

## 2016-04-25 MED ORDER — TRAMADOL HCL 50 MG PO TABS: 50.0000 mg | ORAL_TABLET | Freq: Four times a day (QID) | ORAL | 0 refills | Status: DC | PRN

## 2016-04-25 MED ORDER — ONDANSETRON HCL 4 MG/2ML IJ SOLN
INTRAMUSCULAR | Status: DC | PRN
  Administered 2016-04-25: 4 mg via INTRAVENOUS

## 2016-04-25 MED ORDER — ONDANSETRON HCL 4 MG/2ML IJ SOLN
INTRAMUSCULAR | Status: AC
  Filled 2016-04-25: qty 2

## 2016-04-25 MED ORDER — LIDOCAINE 2% (20 MG/ML) 5 ML SYRINGE
INTRAMUSCULAR | Status: AC
  Filled 2016-04-25: qty 5

## 2016-04-25 MED ORDER — ONDANSETRON HCL 4 MG/2ML IJ SOLN
4.0000 mg | Freq: Once | INTRAMUSCULAR | Status: DC | PRN
  Filled 2016-04-25: qty 2

## 2016-04-25 SURGICAL SUPPLY — 40 items
BANDAGE CO FLEX L/F 2IN X 5YD (GAUZE/BANDAGES/DRESSINGS) ×3 IMPLANT
BANDAGE COBAN STERILE 2 (GAUZE/BANDAGES/DRESSINGS) ×3 IMPLANT
BLADE SURG 15 STRL LF DISP TIS (BLADE) ×1 IMPLANT
BLADE SURG 15 STRL SS (BLADE) ×2
BNDG CONFORM 2 STRL LF (GAUZE/BANDAGES/DRESSINGS) ×3 IMPLANT
COVER BACK TABLE 60X90IN (DRAPES) ×3 IMPLANT
COVER MAYO STAND STRL (DRAPES) ×3 IMPLANT
DECANTER SPIKE VIAL GLASS SM (MISCELLANEOUS) IMPLANT
DRAPE LAPAROTOMY 100X72 PEDS (DRAPES) ×3 IMPLANT
DRAPE LAPAROTOMY T 102X78X121 (DRAPES) ×3 IMPLANT
ELECT NEEDLE TIP 2.8 STRL (NEEDLE) ×3 IMPLANT
ELECT REM PT RETURN 9FT ADLT (ELECTROSURGICAL) ×3
ELECTRODE REM PT RTRN 9FT ADLT (ELECTROSURGICAL) ×1 IMPLANT
GAUZE SPONGE 4X4 16PLY XRAY LF (GAUZE/BANDAGES/DRESSINGS) ×3 IMPLANT
GAUZE XEROFORM 1X8 LF (GAUZE/BANDAGES/DRESSINGS) ×3 IMPLANT
GLOVE BIO SURGEON STRL SZ7 (GLOVE) ×3 IMPLANT
GLOVE BIO SURGEON STRL SZ7.5 (GLOVE) ×3 IMPLANT
GLOVE BIOGEL PI IND STRL 7.0 (GLOVE) ×1 IMPLANT
GLOVE BIOGEL PI IND STRL 7.5 (GLOVE) ×1 IMPLANT
GLOVE BIOGEL PI INDICATOR 7.0 (GLOVE) ×2
GLOVE BIOGEL PI INDICATOR 7.5 (GLOVE) ×2
GOWN STRL REUS W/ TWL XL LVL3 (GOWN DISPOSABLE) ×2 IMPLANT
GOWN STRL REUS W/TWL XL LVL3 (GOWN DISPOSABLE) ×4
KIT ROOM TURNOVER WOR (KITS) ×3 IMPLANT
LIQUID BAND (GAUZE/BANDAGES/DRESSINGS) ×3 IMPLANT
MANIFOLD NEPTUNE II (INSTRUMENTS) IMPLANT
NEEDLE HYPO 25X1 1.5 SAFETY (NEEDLE) ×3 IMPLANT
NS IRRIG 1000ML POUR BTL (IV SOLUTION) ×3 IMPLANT
NS IRRIG 500ML POUR BTL (IV SOLUTION) IMPLANT
PACK BASIN DAY SURGERY FS (CUSTOM PROCEDURE TRAY) ×3 IMPLANT
PENCIL BUTTON HOLSTER BLD 10FT (ELECTRODE) ×3 IMPLANT
SPONGE GAUZE 4X4 12PLY (GAUZE/BANDAGES/DRESSINGS) ×3 IMPLANT
SPONGE GAUZE 4X4 12PLY STER LF (GAUZE/BANDAGES/DRESSINGS) ×3 IMPLANT
SUT VICRYL 4-0 PS2 18IN ABS (SUTURE) ×9 IMPLANT
SYR CONTROL 10ML LL (SYRINGE) IMPLANT
TOWEL OR 17X26 10 PK STRL BLUE (TOWEL DISPOSABLE) ×6 IMPLANT
TRAY DSU PREP LF (CUSTOM PROCEDURE TRAY) ×3 IMPLANT
TUBE CONNECTING 12'X1/4 (SUCTIONS)
TUBE CONNECTING 12X1/4 (SUCTIONS) IMPLANT
WATER STERILE IRR 500ML POUR (IV SOLUTION) IMPLANT

## 2016-04-25 NOTE — Anesthesia Postprocedure Evaluation (Signed)
Anesthesia Post Note  Patient: Akiel Mares  Procedure(s) Performed: Procedure(s) (LRB): CIRCUMCISION ADULT (N/A)  Patient location during evaluation: PACU Anesthesia Type: General Level of consciousness: awake and alert Pain management: pain level controlled Vital Signs Assessment: post-procedure vital signs reviewed and stable Respiratory status: spontaneous breathing, nonlabored ventilation, respiratory function stable and patient connected to nasal cannula oxygen Cardiovascular status: blood pressure returned to baseline and stable Postop Assessment: no signs of nausea or vomiting Anesthetic complications: no    Last Vitals:  Vitals:   04/25/16 1045 04/25/16 1100  BP: 104/81 121/84  Pulse: 60 72  Resp: 10 13  Temp:      Last Pain:  Vitals:   04/25/16 0841  TempSrc: Oral                 Laren Orama JENNETTE

## 2016-04-25 NOTE — Anesthesia Preprocedure Evaluation (Addendum)
Anesthesia Evaluation  Patient identified by MRN, date of birth, ID band Patient awake    Reviewed: Allergy & Precautions, NPO status , Patient's Chart, lab work & pertinent test results  History of Anesthesia Complications Negative for: history of anesthetic complications  Airway Mallampati: III  TM Distance: >3 FB Neck ROM: Full    Dental no notable dental hx. (+) Dental Advisory Given, Edentulous Upper, Poor Dentition, Missing, Chipped   Pulmonary neg pulmonary ROS,    Pulmonary exam normal breath sounds clear to auscultation       Cardiovascular hypertension, Normal cardiovascular exam Rhythm:Regular Rate:Normal     Neuro/Psych Seizures -, Well Controlled,  PSYCHIATRIC DISORDERS Bipolar Disorder TIA   GI/Hepatic negative GI ROS, (+)     substance abuse (hx of heroin abuse, denies use for 3 years, uds positive for opioids, benzos, MJ in 2016)  , Hepatitis -, C  Endo/Other  diabetesMorbid obesity  Renal/GU negative Renal ROS  negative genitourinary   Musculoskeletal  (+) Arthritis ,   Abdominal   Peds negative pediatric ROS (+)  Hematology negative hematology ROS (+)   Anesthesia Other Findings   Reproductive/Obstetrics negative OB ROS                           Anesthesia Physical Anesthesia Plan  ASA: III  Anesthesia Plan: General   Post-op Pain Management:    Induction: Intravenous  Airway Management Planned: LMA  Additional Equipment:   Intra-op Plan:   Post-operative Plan: Extubation in OR  Informed Consent: I have reviewed the patients History and Physical, chart, labs and discussed the procedure including the risks, benefits and alternatives for the proposed anesthesia with the patient or authorized representative who has indicated his/her understanding and acceptance.   Dental advisory given  Plan Discussed with: CRNA  Anesthesia Plan Comments:          Anesthesia Quick Evaluation

## 2016-04-25 NOTE — Anesthesia Procedure Notes (Signed)
Procedure Name: LMA Insertion Date/Time: 04/25/2016 9:39 AM Performed by: Bethena Roys T Pre-anesthesia Checklist: Patient identified, Emergency Drugs available, Suction available and Patient being monitored Patient Re-evaluated:Patient Re-evaluated prior to inductionOxygen Delivery Method: Circle System Utilized Preoxygenation: Pre-oxygenation with 100% oxygen Intubation Type: IV induction Ventilation: Mask ventilation without difficulty LMA: LMA with gastric port inserted LMA Size: 5.0 Number of attempts: 1 Placement Confirmation: positive ETCO2 Tube secured with: Tape Dental Injury: Teeth and Oropharynx as per pre-operative assessment

## 2016-04-25 NOTE — H&P (Signed)
Terry Baxter is an 70 y.o. male.    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 No significant phimosis. He is diabetic but not on meds due to good control dietary per report.   PMH sig for CVA, SZ (uses cane, follows neurology), Bipolar (follows psych), DM2. His PCP is Dr. Montez Baxter.   Today "Terry Baxter" is seen to proceed with circumcision for phimosis and recurrent infections.   Past Medical History:  Diagnosis Date  . Arthritis   . Bipolar 1 disorder (Terry Baxter)    followed by dr headen every 2 months  . Chronic low back pain    goes to pain center  . CKD (chronic kidney disease), stage II   . History of balanitis   . History of hepatitis C    per pt treated w/ medication and cured  in 2014  . History of heroin abuse    per pt stopped and on was methadone until 10-19-2012--  none since  . History of transient ischemic attack (TIA)    11-06-2014  ?tia--  MRA/ MRI normal  . Hyperlipidemia   . Hypertension   . Nocturia   . Phimosis   . Seizure disorder Terry Baxter)    neurologist-  dr Terry Baxter--  last seizure 2014  . Type 2 diabetes, diet controlled (Terry Baxter)   . Urinary incontinence    wear depends  . Wears dentures    upper  . Wears glasses     Past Surgical History:  Procedure Laterality Date  . COLONOSCOPY  2013  . NEGATIVE SLEEP STUDY  2009  per pt  . TRANSTHORACIC ECHOCARDIOGRAM  11/06/2014   ef 45-50%/  trivial MR and TR    History reviewed. No pertinent family history. Social History:  reports that he has never smoked. He has never used smokeless tobacco. He reports that he does not drink alcohol or use drugs.  Allergies:  Allergies  Allergen Reactions  . Tizanidine Other (See Comments)    dizziness    No prescriptions prior to admission.    No results found for this or any previous visit (from the past 48 hour(s)). No results found.  Review of Systems  Constitutional:  Negative.   HENT: Negative.   Eyes: Negative.   Respiratory: Negative.   Cardiovascular: Negative.   Gastrointestinal: Negative.   Genitourinary: Positive for dysuria.  Musculoskeletal: Negative.   Skin: Negative.   Neurological: Negative.   Endo/Heme/Allergies: Negative.   Psychiatric/Behavioral: Negative.     Height 5\' 8"  (1.727 m), weight 125.2 kg (276 lb). Physical Exam  Constitutional: He is oriented to person, place, and time. He appears well-developed.  HENT:  Head: Normocephalic.  Eyes: Pupils are equal, round, and reactive to light.  Neck: Normal range of motion.  Cardiovascular: Normal rate.   Respiratory: Effort normal.  GI: Soft.  Genitourinary:  Genitourinary Comments: Significant phimosis  Musculoskeletal: Normal range of motion.  Neurological: He is alert and oriented to person, place, and time.  Skin: Skin is warm.  Psychiatric: He has a normal mood and affect. His behavior is normal. Judgment and thought content normal.     Assessment/Plan  Recurrent balanoposthitis. Non-circ'd status and diabetes only modifiable factors. Discussed role of elective circumcision and he would like to proceed. Risks, benefits, alternative, peri-op coursre discusssed previously and reiterated today.       Terry Frock, MD 04/25/2016, 6:05 AM

## 2016-04-25 NOTE — Transfer of Care (Signed)
Immediate Anesthesia Transfer of Care Note  Patient: Terry Baxter  Procedure(s) Performed: Procedure(s): CIRCUMCISION ADULT (N/A)  Patient Location: PACU  Anesthesia Type:General  Level of Consciousness: sedated and responds to stimulation  Airway & Oxygen Therapy: Patient Spontanous Breathing and Patient connected to nasal cannula oxygen  Post-op Assessment: Report given to RN  Post vital signs: Reviewed and stable  Last Vitals: 123/82, 62, 10, 99%, 97.6 Vitals:   04/25/16 0841  BP: 105/72  Pulse: 80  Resp: 14  Temp: 37.1 C    Last Pain:  Vitals:   04/25/16 0841  TempSrc: Oral      Patients Stated Pain Goal: 5 ([id number])  Complications: No apparent anesthesia complications

## 2016-04-25 NOTE — Brief Op Note (Signed)
04/25/2016  10:12 AM  PATIENT:  Terry Baxter  70 y.o. male  PRE-OPERATIVE DIAGNOSIS:  PHIMOSIS WITH RECENT BALANITIS  POST-OPERATIVE DIAGNOSIS:  PHIMOSIS WITH RECENT BALANITIS  PROCEDURE:  Procedure(s): CIRCUMCISION ADULT (N/A)  SURGEON:  Surgeon(s) and Role:    * Alexis Frock, MD - Primary  PHYSICIAN ASSISTANT:   ASSISTANTS: none   ANESTHESIA:   regional and general  EBL:  Total I/O In: 100 [I.V.:100] Out: 25 [Blood:25]  BLOOD ADMINISTERED:none  DRAINS: none   LOCAL MEDICATIONS USED:  MARCAINE     SPECIMEN:  Source of Specimen:  foreskin  DISPOSITION OF SPECIMEN:  PATHOLOGY  COUNTS:  YES  TOURNIQUET:  * No tourniquets in log *  DICTATION: .Other Dictation: Dictation Number [id number]  PLAN OF CARE: Discharge to home after PACU  PATIENT DISPOSITION:  PACU - hemodynamically stable.   Delay start of Pharmacological VTE agent (>24hrs) due to surgical blood loss or risk of bleeding: yes

## 2016-04-25 NOTE — Discharge Instructions (Signed)
1-  Stiches - Your stitches are all dissolvable. You may notice a "loose thread" at your incisions, these are normal and require no intervention. They will dissolve completely over the next 3-4 weeks.   Removed dressing tomorrow morning at home.   2 - Diet - No restrictions  3 - Activity - No sexual stimulation x 2 weeks. Otherwise no restrictions.   4 - Bathing - You may shower immediately. Do not take a bath or get into swimming pool where incision sites are submersed in water x 2 weeks.   5 - When to Call the Doctor - Call MD for any fever >102, any acute wound problems, or any severe nausea / vomiting. You can call the [facility] [phone]) 24 hours a day 365 days a year. It will roll-over to the answering service and on-call physician after hours.  Circumcision-Home Care Instructions  The following instructions have been prepared to help you care for yourself upon your return home today.   Wound Care & Hygiene:   You may apply ice to the penis.  This may help to decrease swelling.  Remove the dressing tomorrow.  If the dressing falls off before then, leave it off.  You may shower or bathe in the am.  Gently wash the penis with soap and water.  The stitches do not need to be removed.  Activity:  Do not drive or operate any equipment today.  The effects of anesthesia are still present, drowsiness may result.  Rest today, not necessarily flat bed rest, just take it easy.  You may resume your normal activity in one to two days or as indicated by your physician.  Sexual Activity:  Erection and sexual relations should be avoided for 2 weeks.  Return to Work:  One to two days or as indicated by your physician  Diet:  Drink liquids or eat a very light diet this evening.  You may resume a regular diet tomorrow.  General Expectations of your surgery:   You may have a small amount of bleeding  The penis will be swollen and bruised for approximately one week  You may  wake during the night with an erection, usually this is caused by having a full bladder so you should try to urinate (pass your water) to relieve the erection or apply ice to the penis  Unexpected Observations - Call your doctor if these occur!  Persistent or heavy bleeding  Temperature of 101 degrees or more  Severe pain not relieved by medication  Return to Office  .  Call to schedule your appointment  Additional Instructions:   Patient's Signature:__________________________________________________  [id number] Signature:___________________ Nurse's Signature_______________  Terry Baxter [address] [place], [place] [address] Tel: [place] Instructions  Activity: Get plenty of rest for the remainder of the day. A responsible adult should stay with you for 24 hours following the procedure.  For the next 24 hours, DO NOT: -Drive a car -Paediatric nurse -Drink alcoholic beverages -Take any medication unless instructed by your physician -Make any legal decisions or sign important papers.  Meals: Start with liquid foods such as gelatin or soup. Progress to regular foods as tolerated. Avoid greasy, spicy, heavy foods. If nausea and/or vomiting occur, drink only clear liquids until the nausea and/or vomiting subsides. Call your physician if vomiting continues.  Special Instructions/Symptoms: Your throat may feel dry or sore from the anesthesia or the breathing tube placed in your throat  during surgery. If this causes discomfort, gargle with warm salt water. The discomfort should disappear within 24 hours.  If you had a scopolamine patch placed behind your ear for the management of post- operative nausea and/or vomiting:  1. The medication in the patch is effective for 72 hours, after which it should be removed.  Wrap patch in a tissue and discard in the trash. Wash hands thoroughly with soap and water. 2. You may remove the patch  earlier than 72 hours if you experience unpleasant side effects which may include dry mouth, dizziness or visual disturbances. 3. Avoid touching the patch. Wash your hands with soap and water after contact with the patch.

## 2016-04-28 ENCOUNTER — Encounter (HOSPITAL_BASED_OUTPATIENT_CLINIC_OR_DEPARTMENT_OTHER): Payer: Self-pay | Admitting: Urology

## 2016-04-28 NOTE — Op Note (Signed)
Terry Baxter, [place] NO.:  [id number]  MEDICAL RECORD NO.:  [id number]  LOCATION:                                 FACILITY:  PHYSICIAN:  Alexis Frock, MD     DATE OF BIRTH:  01-13-1946  DATE OF PROCEDURE: 04/25/2016                               OPERATIVE REPORT   DIAGNOSIS:  Phimosis with recurrent balanoposthitis.  PROCEDURE:  Circumcision and penile block.  ESTIMATED BLOOD LOSS:  Nil.  COMPLICATIONS:  None.  SPECIMENS:  Foreskin for permanent pathology.  FINDINGS:  Redundant prepuce with changes consistent with balanoposthitis.  No active infection present.  INDICATION:  Mr. Dorsainvil is a pleasant 70 year old gentleman with history of well-controlled psychiatric illness who has a history of recurrent phimosis with balanitis times several.  He is also diabetic.  This has been increasingly bothersome to him requiring frequent therapy with topical agents and we discussed further management with possible circumcision versus a dorsal slit and he wished to proceed with the former.  Informed consent was obtained and placed in the medical record.  PROCEDURE IN DETAIL:  The patient being Jaquel Greenleaf verified. Procedure being circumcision confirmed.  Procedure was carried out. Time-out was performed.  Intravenous antibiotics were administered. General anesthesia introduced.  The patient was placed into a supine position.  Sterile field was created by prepping and draping the patient's penis, perineum, and proximal thighs including his inner and outer foreskin leaflet using iodinated prep.  The 12 o'clock position was marked with a marking pen as was proximal and distal collar, proximal collar corresponding to the location of the unstretched corona of the glans.  A circumferential incision was made at each one of these sites, it was connected in the 12 o'clock position and the collar redundant prepped, this was removed using cautery energy away  from surrounding underlying tissue taking great care to avoid urethral injury, which did not occur.  A 12 o'clock and 6 o'clock stay sutures were applied and 6 o'clock being a U-stitch at the area of the frenulum and two separate suture lines of running Vicryl were used, one on the right side, one on the left side reapproximating the skin edges.  This resulted in excellent skin apposition and cosmesis with complete resolution of prior phimosis.  Dermabond was then applied, allowed to dry while penile block was performed with 5 mL of 0.25% Marcaine instilled just below the confluence of pubic ramus, additional 5 mL in ring block fashion at the base of the penis.  A dressing of Xeroform and Conform were applied and procedure terminated.  The patient tolerated the procedure well and no immediate periprocedural complications.  The patient was taken to the postanesthesia care unit in stable condition.    ______________________________ Alexis Frock, MD   ______________________________ Alexis Frock, MD    TM/MEDQ  D:  04/25/2016  T:  04/26/2016  Job:  [id number]

## 2016-05-15 ENCOUNTER — Encounter: Payer: Medicare Other | Attending: General Surgery | Admitting: Dietician

## 2016-05-15 DIAGNOSIS — Z713 Dietary counseling and surveillance: Secondary | ICD-10-CM | POA: Insufficient documentation

## 2016-05-15 DIAGNOSIS — E669 Obesity, unspecified: Secondary | ICD-10-CM | POA: Diagnosis not present

## 2016-05-15 DIAGNOSIS — Z6841 Body Mass Index (BMI) 40.0 and over, adult: Secondary | ICD-10-CM | POA: Diagnosis not present

## 2016-05-15 NOTE — Progress Notes (Signed)
  6 Months Supervised Weight Loss Visit:   Pre-Operative Sleeve Gastrectomy Surgery  Medical Nutrition Therapy:  Appt start time: 0805 end time:  0820.  Primary concerns today: Supervised Weight Loss Visit. Terry Baxter returns today with an 11 lb weight loss since July! Has been cutting back on portions. Has been seeing his PCP for supervised weight loss. States that he needs 2 more appointments and will be seeing his doctor for those appointment  Has been practicing chewing well. Has also been practicing not drinking during meals.   Weight: 273.4 lbs BMI: 40.37  Preferred Learning Style:   No preference indicated   Learning Readiness:   Ready  24-hr recall: B (AM): Ensure  Snk (AM): none L (PM): peanut butter sandwich on rye bread Snk (PM): none D (PM): Lean Cuisine Snk (PM): none  Beverages: water 2 liters per day, coffee (seldom)  Medications: see list   Recent physical activity:  Walks 3 blocks per day  Progress Towards Goal(s):  In progress.  Handouts given during visit include:  Pre Op Goals   Protein Shakes   Nutritional Diagnosis:  [place]-3.3 Obesity related to past poor dietary habits and physical inactivity as evidenced by patient attending supervised weight loss for insurance approval of bariatric surgery.    Intervention:  Nutrition counseling provided. Plan: Pharmacist, community. Continue working on chewing well and not drinking during meals.  Work on walking one more block per day.   Teaching Method Utilized:  Visual Auditory Hands on  Barriers to learning/adherence to lifestyle change: none  Demonstrated degree of understanding via:  Teach Back   Monitoring/Evaluation:  Dietary intake, exercise, and body weight. Follow up to attend pre op class.

## 2016-05-15 NOTE — Patient Instructions (Addendum)
Try Community education officer. Continue working on chewing well and not drinking during meals.  Work on walking one more block per day.

## 2016-06-12 ENCOUNTER — Encounter: Payer: Medicare Other | Attending: General Surgery | Admitting: Dietician

## 2016-06-12 DIAGNOSIS — E669 Obesity, unspecified: Secondary | ICD-10-CM | POA: Insufficient documentation

## 2016-06-12 DIAGNOSIS — Z6841 Body Mass Index (BMI) 40.0 and over, adult: Secondary | ICD-10-CM | POA: Insufficient documentation

## 2016-06-12 DIAGNOSIS — E119 Type 2 diabetes mellitus without complications: Secondary | ICD-10-CM | POA: Diagnosis not present

## 2016-06-12 DIAGNOSIS — Z713 Dietary counseling and surveillance: Secondary | ICD-10-CM | POA: Insufficient documentation

## 2016-06-12 DIAGNOSIS — Z79899 Other long term (current) drug therapy: Secondary | ICD-10-CM | POA: Insufficient documentation

## 2016-06-12 NOTE — Progress Notes (Signed)
  6 Months Supervised Weight Loss Visit:   Pre-Operative Sleeve Gastrectomy Surgery  Medical Nutrition Therapy:  Appt start time: 0830 end time:  0845.  Primary concerns today: Supervised Weight Loss Visit. Terry Baxter returns today with no weight change since last month. Has one more appointment with PCP before surgery. He is going to check with [facility] to see if he has completed enough appointments.  Has been working on walking more this past month in his hallway.  Has been practicing chewing well. Has also been practicing not drinking during meals.   Weight: 273.4 lbs BMI: 40.37  Preferred Learning Style:   No preference indicated   Learning Readiness:   Ready  24-hr recall: B (AM): Ensure  Snk (AM): none L (PM): peanut butter sandwich on rye bread Snk (PM): none D (PM): Lean Cuisine Snk (PM): none  Beverages: water 2 liters per day, coffee (seldom)  Medications: see list   Recent physical activity:  Walks in hall at home.   Progress Towards Goal(s):  In progress.  Handouts given during visit include:  none   Nutritional Diagnosis:  [place]-3.3 Obesity related to past poor dietary habits and physical inactivity as evidenced by patient attending supervised weight loss for insurance approval of bariatric surgery.    Intervention:  Nutrition counseling provided. Plan: Pharmacist, community. Continue working on chewing well and not drinking during meals.  Plan to walk 4 blocks each day  Teaching Method Utilized:  Visual Auditory Hands on  Barriers to learning/adherence to lifestyle change: none  Demonstrated degree of understanding via:  Teach Back   Monitoring/Evaluation:  Dietary intake, exercise, and body weight. Follow up to attend pre op class.

## 2016-06-12 NOTE — Patient Instructions (Addendum)
Try Community education officer. Continue working on chewing well and not drinking during meals.  Plan to walk 4 blocks each day  Call [facility] to check on appointments: [phone]

## 2016-06-23 ENCOUNTER — Emergency Department (HOSPITAL_COMMUNITY): Payer: Medicare Other

## 2016-06-23 ENCOUNTER — Encounter (HOSPITAL_COMMUNITY): Payer: Self-pay

## 2016-06-23 ENCOUNTER — Inpatient Hospital Stay (HOSPITAL_COMMUNITY)
Admission: EM | Admit: 2016-06-23 | Discharge: 2016-06-24 | DRG: 872 | Disposition: A | Discharge status: Home / Self Care | Payer: Medicare Other | Attending: Internal Medicine | Admitting: Internal Medicine

## 2016-06-23 DIAGNOSIS — Z888 Allergy status to other drugs, medicaments and biological substances status: Secondary | ICD-10-CM

## 2016-06-23 DIAGNOSIS — I129 Hypertensive chronic kidney disease with stage 1 through stage 4 chronic kidney disease, or unspecified chronic kidney disease: Secondary | ICD-10-CM | POA: Diagnosis present

## 2016-06-23 DIAGNOSIS — G8929 Other chronic pain: Secondary | ICD-10-CM | POA: Diagnosis present

## 2016-06-23 DIAGNOSIS — G40909 Epilepsy, unspecified, not intractable, without status epilepticus: Secondary | ICD-10-CM | POA: Diagnosis not present

## 2016-06-23 DIAGNOSIS — Z8673 Personal history of transient ischemic attack (TIA), and cerebral infarction without residual deficits: Secondary | ICD-10-CM

## 2016-06-23 DIAGNOSIS — B182 Chronic viral hepatitis C: Secondary | ICD-10-CM | POA: Diagnosis present

## 2016-06-23 DIAGNOSIS — M545 Low back pain: Secondary | ICD-10-CM | POA: Diagnosis present

## 2016-06-23 DIAGNOSIS — Z7982 Long term (current) use of aspirin: Secondary | ICD-10-CM

## 2016-06-23 DIAGNOSIS — E1340 Other specified diabetes mellitus with diabetic neuropathy, unspecified: Secondary | ICD-10-CM | POA: Diagnosis present

## 2016-06-23 DIAGNOSIS — E1140 Type 2 diabetes mellitus with diabetic neuropathy, unspecified: Secondary | ICD-10-CM | POA: Diagnosis present

## 2016-06-23 DIAGNOSIS — F319 Bipolar disorder, unspecified: Secondary | ICD-10-CM | POA: Diagnosis present

## 2016-06-23 DIAGNOSIS — E1151 Type 2 diabetes mellitus with diabetic peripheral angiopathy without gangrene: Secondary | ICD-10-CM | POA: Diagnosis present

## 2016-06-23 DIAGNOSIS — W19XXXA Unspecified fall, initial encounter: Secondary | ICD-10-CM | POA: Diagnosis present

## 2016-06-23 DIAGNOSIS — R319 Hematuria, unspecified: Secondary | ICD-10-CM | POA: Diagnosis present

## 2016-06-23 DIAGNOSIS — N182 Chronic kidney disease, stage 2 (mild): Secondary | ICD-10-CM | POA: Diagnosis present

## 2016-06-23 DIAGNOSIS — A419 Sepsis, unspecified organism: Principal | ICD-10-CM | POA: Diagnosis present

## 2016-06-23 DIAGNOSIS — E785 Hyperlipidemia, unspecified: Secondary | ICD-10-CM | POA: Diagnosis present

## 2016-06-23 DIAGNOSIS — N3000 Acute cystitis without hematuria: Secondary | ICD-10-CM | POA: Diagnosis not present

## 2016-06-23 DIAGNOSIS — N390 Urinary tract infection, site not specified: Secondary | ICD-10-CM | POA: Diagnosis present

## 2016-06-23 DIAGNOSIS — Z590 Homelessness: Secondary | ICD-10-CM

## 2016-06-23 DIAGNOSIS — Z79899 Other long term (current) drug therapy: Secondary | ICD-10-CM

## 2016-06-23 DIAGNOSIS — E1122 Type 2 diabetes mellitus with diabetic chronic kidney disease: Secondary | ICD-10-CM | POA: Diagnosis present

## 2016-06-23 DIAGNOSIS — T07XXXA Unspecified multiple injuries, initial encounter: Secondary | ICD-10-CM

## 2016-06-23 LAB — URINE MICROSCOPIC-ADD ON
RBC / HPF: NONE SEEN RBC/hpf (ref 0–5)
SQUAMOUS EPITHELIAL / LPF: NONE SEEN

## 2016-06-23 LAB — CBC WITH DIFFERENTIAL/PLATELET
Basophils Absolute: 0 10*3/uL (ref 0.0–0.1)
Basophils Relative: 0 %
EOS ABS: 0 10*3/uL (ref 0.0–0.7)
EOS PCT: 0 %
HCT: 43 % (ref 39.0–52.0)
HEMOGLOBIN: 14.6 g/dL (ref 13.0–17.0)
LYMPHS ABS: 0.8 10*3/uL (ref 0.7–4.0)
Lymphocytes Relative: 5 %
MCH: 31.8 pg (ref 26.0–34.0)
MCHC: 34 g/dL (ref 30.0–36.0)
MCV: 93.7 fL (ref 78.0–100.0)
MONO ABS: 1.4 10*3/uL — AB (ref 0.1–1.0)
MONOS PCT: 8 %
Neutro Abs: 14.1 10*3/uL — ABNORMAL HIGH (ref 1.7–7.7)
Neutrophils Relative %: 87 %
PLATELETS: 166 10*3/uL (ref 150–400)
RBC: 4.59 MIL/uL (ref 4.22–5.81)
RDW: 14.3 % (ref 11.5–15.5)
WBC: 16.3 10*3/uL — ABNORMAL HIGH (ref 4.0–10.5)

## 2016-06-23 LAB — TROPONIN I: Troponin I: 0.12 ng/mL (ref <0.03)

## 2016-06-23 LAB — URINALYSIS, ROUTINE W REFLEX MICROSCOPIC
Glucose, UA: NEGATIVE mg/dL
NITRITE: POSITIVE — AB
PH: 5.5 (ref 5.0–8.0)
PROTEIN: 100 mg/dL — AB
Specific Gravity, Urine: 1.029 (ref 1.005–1.030)

## 2016-06-23 LAB — I-STAT CG4 LACTIC ACID, ED: LACTIC ACID, VENOUS: 2.09 mmol/L — AB (ref 0.5–1.9)

## 2016-06-23 LAB — COMPREHENSIVE METABOLIC PANEL
ALK PHOS: 73 U/L (ref 38–126)
ALT: 30 U/L (ref 17–63)
ANION GAP: 12 (ref 5–15)
AST: 160 U/L — ABNORMAL HIGH (ref 15–41)
Albumin: 4.3 g/dL (ref 3.5–5.0)
BUN: 22 mg/dL — ABNORMAL HIGH (ref 6–20)
CALCIUM: 9.4 mg/dL (ref 8.9–10.3)
CO2: 21 mmol/L — AB (ref 22–32)
Chloride: 108 mmol/L (ref 101–111)
Creatinine, Ser: 1.41 mg/dL — ABNORMAL HIGH (ref 0.61–1.24)
GFR calc non Af Amer: 50 mL/min — ABNORMAL LOW (ref >60)
GFR, EST AFRICAN AMERICAN: 58 mL/min — AB (ref >60)
Glucose, Bld: 91 mg/dL (ref 65–99)
POTASSIUM: 3.6 mmol/L (ref 3.5–5.1)
SODIUM: 141 mmol/L (ref 135–145)
Total Bilirubin: 1.3 mg/dL — ABNORMAL HIGH (ref 0.3–1.2)
Total Protein: 8.4 g/dL — ABNORMAL HIGH (ref 6.5–8.1)

## 2016-06-23 LAB — CG4 I-STAT (LACTIC ACID): Lactic Acid, Venous: 1.95 mmol/L (ref 0.5–1.9)

## 2016-06-23 LAB — CBG MONITORING, ED: Glucose-Capillary: 97 mg/dL (ref 65–99)

## 2016-06-23 MED ORDER — HEPARIN SODIUM (PORCINE) 5000 UNIT/ML IJ SOLN
5000.0000 [IU] | Freq: Three times a day (TID) | INTRAMUSCULAR | Status: DC
  Administered 2016-06-23 – 2016-06-24 (×2): 5000 [IU] via SUBCUTANEOUS
  Filled 2016-06-23 (×2): qty 1

## 2016-06-23 MED ORDER — SODIUM CHLORIDE 0.9% FLUSH: 3.0000 mL | INTRAVENOUS | Status: DC | PRN

## 2016-06-23 MED ORDER — SODIUM CHLORIDE 0.9 % IV BOLUS (SEPSIS)
500.0000 mL | Freq: Once | INTRAVENOUS | Status: AC
  Administered 2016-06-23: 500 mL via INTRAVENOUS

## 2016-06-23 MED ORDER — ACETAMINOPHEN 325 MG PO TABS: 650.0000 mg | ORAL_TABLET | Freq: Four times a day (QID) | ORAL | Status: DC | PRN

## 2016-06-23 MED ORDER — LEVETIRACETAM 500 MG PO TABS
500.0000 mg | ORAL_TABLET | Freq: Two times a day (BID) | ORAL | Status: DC
  Administered 2016-06-23 – 2016-06-24 (×2): 500 mg via ORAL
  Filled 2016-06-23 (×2): qty 1

## 2016-06-23 MED ORDER — BUPROPION HCL ER (XL) 150 MG PO TB24
300.0000 mg | ORAL_TABLET | Freq: Every day | ORAL | Status: DC
  Administered 2016-06-24: 300 mg via ORAL
  Filled 2016-06-23: qty 2

## 2016-06-23 MED ORDER — HYDROCODONE-ACETAMINOPHEN 10-325 MG PO TABS: 1.0000 | ORAL_TABLET | Freq: Three times a day (TID) | ORAL | Status: DC | PRN

## 2016-06-23 MED ORDER — SODIUM CHLORIDE 0.9 % IV SOLN
250.0000 mL | INTRAVENOUS | Status: DC | PRN
  Administered 2016-06-23 (×2): 250 mL via INTRAVENOUS

## 2016-06-23 MED ORDER — RISPERIDONE 1 MG PO TABS
1.0000 mg | ORAL_TABLET | Freq: Two times a day (BID) | ORAL | Status: DC
  Administered 2016-06-23 – 2016-06-24 (×2): 1 mg via ORAL
  Filled 2016-06-23 (×2): qty 1

## 2016-06-23 MED ORDER — SODIUM CHLORIDE 0.9% FLUSH
3.0000 mL | Freq: Two times a day (BID) | INTRAVENOUS | Status: DC
  Administered 2016-06-23 – 2016-06-24 (×2): 3 mL via INTRAVENOUS

## 2016-06-23 MED ORDER — DEXTROSE 5 % IV SOLN
2.0000 g | Freq: Once | INTRAVENOUS | Status: AC
  Administered 2016-06-23: 2 g via INTRAVENOUS
  Filled 2016-06-23: qty 2

## 2016-06-23 MED ORDER — ASPIRIN 325 MG PO TABS
325.0000 mg | ORAL_TABLET | Freq: Every day | ORAL | Status: DC
  Administered 2016-06-24: 325 mg via ORAL
  Filled 2016-06-23: qty 1

## 2016-06-23 MED ORDER — BUSPIRONE HCL 5 MG PO TABS
10.0000 mg | ORAL_TABLET | Freq: Every day | ORAL | Status: DC
  Administered 2016-06-24: 10 mg via ORAL
  Filled 2016-06-23: qty 2

## 2016-06-23 MED ORDER — DOXEPIN HCL 10 MG PO CAPS
20.0000 mg | ORAL_CAPSULE | Freq: Every day | ORAL | Status: DC
  Administered 2016-06-23: 20 mg via ORAL
  Filled 2016-06-23 (×2): qty 2

## 2016-06-23 MED ORDER — ACETAMINOPHEN 650 MG RE SUPP: 650.0000 mg | Freq: Four times a day (QID) | RECTAL | Status: DC | PRN

## 2016-06-23 MED ORDER — TAMSULOSIN HCL 0.4 MG PO CAPS
0.4000 mg | ORAL_CAPSULE | Freq: Every evening | ORAL | Status: DC
  Administered 2016-06-23: 0.4 mg via ORAL
  Filled 2016-06-23: qty 1

## 2016-06-23 MED ORDER — DEXTROSE 5 % IV SOLN
1.0000 g | INTRAVENOUS | Status: DC
  Administered 2016-06-24: 1 g via INTRAVENOUS
  Filled 2016-06-23: qty 10

## 2016-06-23 MED ORDER — ATORVASTATIN CALCIUM 40 MG PO TABS
40.0000 mg | ORAL_TABLET | Freq: Every evening | ORAL | Status: DC
  Administered 2016-06-23: 40 mg via ORAL

## 2016-06-23 MED ORDER — CLONAZEPAM 0.5 MG PO TABS
0.5000 mg | ORAL_TABLET | Freq: Three times a day (TID) | ORAL | Status: DC
  Administered 2016-06-23 – 2016-06-24 (×3): 0.5 mg via ORAL
  Filled 2016-06-23 (×3): qty 1

## 2016-06-23 MED ORDER — GABAPENTIN 400 MG PO CAPS
800.0000 mg | ORAL_CAPSULE | Freq: Three times a day (TID) | ORAL | Status: DC
  Administered 2016-06-23 – 2016-06-24 (×3): 800 mg via ORAL
  Filled 2016-06-23 (×3): qty 2

## 2016-06-23 NOTE — ED Notes (Signed)
AWARE OF NEED FOR URINE SAMPLE 

## 2016-06-23 NOTE — ED Notes (Signed)
Unable to use urine PT urine did not make it into urinal

## 2016-06-23 NOTE — ED Notes (Signed)
BLOOD CULTURE SET #1 DRAWN AND SENT.

## 2016-06-23 NOTE — ED Notes (Signed)
Bed: [id number] Expected date:  Expected time:  Means of arrival:  Comments: EMS- 70yo M, woke up on floor

## 2016-06-23 NOTE — Progress Notes (Signed)
Pharmacy Antibiotic Follow-up Note  Terry Baxter is a 70 y.o. year-old male admitted on 06/23/2016.  The patient is currently on day 1 of Rocephin for probable urosepsis.  Assessment/Plan: Rocephin 2gm x1, then 1gm q24  Temp (24hrs), Avg:98.7 F (37.1 C), Min:98.1 F (36.7 C), Max:99.2 F (37.3 C)   Recent Labs Lab 06/23/16 1241  WBC 16.3*    Recent Labs Lab 06/23/16 1241  CREATININE 1.41*   Estimated Creatinine Clearance: 64.3 mL/min (by C-G formula based on SCr of 1.41 mg/dL (H)).    Allergies  Allergen Reactions  . Tizanidine Other (See Comments)    dizziness   Antimicrobials this admission: 11/27 Rocephin >>   Microbiology results: 11/27 BCx: sent 11/27 UCx: sent   Thank you for allowing pharmacy to be a part of this patient's care.  Minda Ditto PharmD Pager [phone] 06/23/2016, 2:44 PM

## 2016-06-23 NOTE — H&P (Signed)
History and Physical    Terry Baxter [id number] DOB: July 30, 1945 DOA: 06/23/2016  PCP: Patricia Nettle, MD  Patient coming from: home  Chief Complaint: fall, weakness  HPI: Terry Baxter is a 70 y.o. male with medical history significant of PMH listed below. Patient is presenting with generalized weakness which has been getting worse for the last couple days per my discussion with him. Patient also endorses polyuria but no dysuria. The problem has been persistent since onset. Problem has been gradually getting worse. Nothing he is aware of makes it better or worse.  ED Course: Patient was found to have leukocytosis 16.3 and urinalysis which reported turbid urine which many bacteria with small leukocytes and positive nitrite. He was started on Rocephin and we were consulted for further medical evaluation recommendations  Review of Systems: As per HPI otherwise 10 point review of systems negative.    Past Medical History:  Diagnosis Date  . Arthritis   . Bipolar 1 disorder ([place])    followed by dr headen every 2 months  . Chronic low back pain    goes to pain center  . CKD (chronic kidney disease), stage II   . Hepatitis   . History of balanitis   . History of hepatitis C    per pt treated w/ medication and cured  in 2014  . History of heroin abuse    per pt stopped and on was methadone until 10-19-2012--  none since  . History of transient ischemic attack (TIA)    11-06-2014  ?tia--  MRA/ MRI normal  . Hyperlipidemia   . Hypertension   . Nocturia   . Phimosis   . Seizure disorder [facility])    neurologist-  dr Rosalin Hawking--  last seizure 2014  . Seizures ([place])   . Type 2 diabetes, diet controlled ([place])   . Urinary incontinence    wear depends  . Wears dentures    upper  . Wears glasses     Past Surgical History:  Procedure Laterality Date  . BACK SURGERY    . CIRCUMCISION N/A 04/25/2016   Procedure: CIRCUMCISION ADULT;  Surgeon: Alexis Frock, MD;  Location: [facility];  Service: Urology;  Laterality: N/A;  . COLONOSCOPY  2013  . NEGATIVE SLEEP STUDY  2009  per pt  . TRANSTHORACIC ECHOCARDIOGRAM  11/06/2014   ef 45-50%/  trivial MR and TR     reports that he has never smoked. He has never used smokeless tobacco. He reports that he does not drink alcohol or use drugs.  Allergies  Allergen Reactions  . Tizanidine Other (See Comments)    dizziness    Family history - None reported when asked directly   Prior to Admission medications   Medication Sig Start Date End Date Taking? Authorizing Provider  atorvastatin (LIPITOR) 40 MG tablet Take 1 tablet (40 mg total) by mouth daily at 6 PM. Patient taking differently: Take 40 mg by mouth every evening.  11/09/14  Yes Charolette Forward, MD  buPROPion (WELLBUTRIN XL) 300 MG 24 hr tablet Take 300 mg by mouth daily with breakfast. 06/02/16  Yes Historical Provider, MD  busPIRone (BUSPAR) 10 MG tablet Take 10 mg by mouth daily with breakfast.  03/02/14  Yes Historical Provider, MD  clonazePAM (KLONOPIN) 0.5 MG tablet Take 0.5 mg by mouth 3 (three) times daily.  02/08/16  Yes Historical Provider, MD  docusate sodium (COLACE) 100 MG capsule Take 100 mg by mouth daily. 11/29/15  Yes Historical Provider,  MD  doxepin (SINEQUAN) 10 MG capsule TAkE 2 CAPSULES BY MOUTH AT BEDTIME 07/07/15  Yes Historical Provider, MD  gabapentin (NEURONTIN) 400 MG capsule Take 800 mg by mouth 3 (three) times daily.  02/07/16  Yes Historical Provider, MD  hydrochlorothiazide (MICROZIDE) 12.5 MG capsule Take 12.5 mg by mouth every evening.  01/03/15  Yes Historical Provider, MD  HYDROcodone-acetaminophen (NORCO) 10-325 MG tablet Take 1 tablet by mouth 3 (three) times daily as needed for moderate pain.  02/02/16  Yes Historical Provider, MD  influenza vac recom quadrivalent (FLUBLOK) 0.5 ML injection Inject 0.5 mLs into the muscle once.   Yes Historical Provider, MD  levETIRAcetam (KEPPRA) 500 MG tablet Take 500 mg by mouth 2 (two)  times daily.   Yes Historical Provider, MD  Multiple Vitamin (MULTIVITAMIN WITH MINERALS) TABS tablet Take 1 tablet by mouth daily.   Yes Historical Provider, MD  Naproxen Sod-Diphenhydramine (ALEVE PM) 220-25 MG TABS Take 1-2 tablets by mouth at bedtime.    Yes Historical Provider, MD  risperiDONE (RISPERDAL) 1 MG tablet Take 1 mg by mouth 2 (two) times daily.  02/06/16  Yes Historical Provider, MD  tamsulosin (FLOMAX) 0.4 MG CAPS capsule Take 0.4 mg by mouth every evening. 12/11/15  Yes Historical Provider, MD  aspirin 325 MG tablet Take 1 tablet (325 mg total) by mouth daily. Patient not taking: Reported on 06/23/2016 11/09/14   Charolette Forward, MD  traMADol (ULTRAM) 50 MG tablet Take 1-2 tablets (50-100 mg total) by mouth every 6 (six) hours as needed for moderate pain. Post-operatively Patient not taking: Reported on 06/23/2016 04/25/16   Alexis Frock, MD    Physical Exam: Vitals:   06/23/16 1530 06/23/16 1600 06/23/16 1630 06/23/16 1656  BP: 139/75 128/76 130/85 (!) 141/85  Pulse: 87 89 86 89  Resp: 19 21 15 16   Temp:  98.8 F (37.1 C)  98.3 F (36.8 C)  TempSrc:  Oral  Oral  SpO2: 99% 99% 99% 99%  Weight:    95.5 kg (210 lb 9.6 oz)  Height:    5\' 8"  (1.727 m)    Constitutional: NAD, calm, comfortable Vitals:   06/23/16 1530 06/23/16 1600 06/23/16 1630 06/23/16 1656  BP: 139/75 128/76 130/85 (!) 141/85  Pulse: 87 89 86 89  Resp: 19 21 15 16   Temp:  98.8 F (37.1 C)  98.3 F (36.8 C)  TempSrc:  Oral  Oral  SpO2: 99% 99% 99% 99%  Weight:    95.5 kg (210 lb 9.6 oz)  Height:    5\' 8"  (1.727 m)   Eyes: PERRL, lids and conjunctivae normal ENMT: Mucous membranes are moist. Posterior pharynx clear of any exudate or lesions Neck: normal, supple, no masses, no thyromegaly Respiratory: clear to auscultation bilaterally, no wheezing, no crackles. Normal respiratory effort. No accessory muscle use.  Cardiovascular: Regular rate and rhythm, no murmurs / rubs / gallops.  Abdomen:  obese, NT, ND, no guarding, + bowel sound Musculoskeletal: no clubbing / cyanosis. No joint deformity upper and lower extremities. Good ROM, no contractures. Normal muscle tone.  Skin: no rashes, lesions, ulcers. No induration, on limited exam Neurologic: CN 3-12 grossly intact. Sensation intact,  Psychiatric: Normal judgment and insight. Alert and oriented x 3. Normal mood.    Labs on Admission: I have personally reviewed following labs and imaging studies  CBC:  Recent Labs Lab 06/23/16 1241  WBC 16.3*  NEUTROABS 14.1*  HGB 14.6  HCT 43.0  MCV 93.7  PLT 166   Basic  Metabolic Panel:  Recent Labs Lab 06/23/16 1241  NA 141  K 3.6  CL 108  CO2 21*  GLUCOSE 91  BUN 22*  CREATININE 1.41*  CALCIUM 9.4   GFR: Estimated Creatinine Clearance: 56.2 mL/min (by C-G formula based on SCr of 1.41 mg/dL (H)). Liver Function Tests:  Recent Labs Lab 06/23/16 1241  AST 160*  ALT 30  ALKPHOS 73  BILITOT 1.3*  PROT 8.4*  ALBUMIN 4.3   No results for input(s): LIPASE, AMYLASE in the last 168 hours. No results for input(s): AMMONIA in the last 168 hours. Coagulation Profile: No results for input(s): INR, PROTIME in the last 168 hours. Cardiac Enzymes:  Recent Labs Lab 06/23/16 1241  TROPONINI 0.12*   BNP (last 3 results) No results for input(s): PROBNP in the last 8760 hours. HbA1C: No results for input(s): HGBA1C in the last 72 hours. CBG:  Recent Labs Lab 06/23/16 1241  GLUCAP 97   Lipid Profile: No results for input(s): CHOL, HDL, LDLCALC, TRIG, CHOLHDL, LDLDIRECT in the last 72 hours. Thyroid Function Tests: No results for input(s): TSH, T4TOTAL, FREET4, T3FREE, THYROIDAB in the last 72 hours. Anemia Panel: No results for input(s): VITAMINB12, FOLATE, FERRITIN, TIBC, IRON, RETICCTPCT in the last 72 hours. Urine analysis:    Component Value Date/Time   COLORURINE RED (A) 06/23/2016 1322   APPEARANCEUR TURBID (A) 06/23/2016 1322   LABSPEC 1.029 06/23/2016  1322   PHURINE 5.5 06/23/2016 1322   GLUCOSEU NEGATIVE 06/23/2016 1322   HGBUR LARGE (A) 06/23/2016 1322   BILIRUBINUR SMALL (A) 06/23/2016 1322   KETONESUR >80 (A) 06/23/2016 1322   PROTEINUR 100 (A) 06/23/2016 1322   UROBILINOGEN 1.0 11/07/2014 0511   NITRITE POSITIVE (A) 06/23/2016 1322   LEUKOCYTESUR SMALL (A) 06/23/2016 1322   Sepsis Labs: !!!!!!!!!!!!!!!!!!!!!!!!!!!!!!!!!!!!!!!!!!!! @LABRCNTIP (procalcitonin:4,lacticidven:4) )No results found for this or any previous visit (from the past 240 hour(s)).   Radiological Exams on Admission: Dg Chest 2 View  Result Date: 06/23/2016 CLINICAL DATA:  History of seizure activity. Found on the floor unresponsive with evidence of incontinence. No known trauma. EXAM: CHEST  2 VIEW COMPARISON:  Portable chest x-ray of October 19, 2013 FINDINGS: The lungs are adequately inflated. The interstitial markings are coarse at the lung bases but this is stable. There is no alveolar infiltrate, pleural effusion, or pneumothorax. The heart and pulmonary vascularity are normal. The mediastinum is normal in width. There is multilevel degenerative disc disease of the thoracic spine. IMPRESSION: Noactive cardiopulmonary disease. Electronically Signed   By: David  Martinique M.D.   On: 06/23/2016 12:19   Ct Head Wo Contrast  Result Date: 06/23/2016 CLINICAL DATA:  [place] this am 0200; was unable to get up; pt unaware if he hit his head; + LOC; no neck or head c/o EXAM: CT HEAD WITHOUT CONTRAST CT CERVICAL SPINE WITHOUT CONTRAST TECHNIQUE: Multidetector CT imaging of the head and cervical spine was performed following the standard protocol without intravenous contrast. Multiplanar CT image reconstructions of the cervical spine were also generated. COMPARISON:  01/03/2016 FINDINGS: CT HEAD FINDINGS Brain: There is central and cortical atrophy. Periventricular white matter changes are consistent with small vessel disease. There is no intra or extra-axial fluid collection or  mass lesion. The basilar cisterns and ventricles have a normal appearance. There is no CT evidence for acute infarction or hemorrhage. Vascular: No hyperdense vessel or unexpected calcification. Skull: Normal. Negative for fracture or focal lesion. Sinuses/Orbits: No acute finding. Other: None CT CERVICAL SPINE FINDINGS Alignment: There is significant degenerative  change within the mid cervical spine, most notably at C4-5, C5-6, C6-7, and C7-T1. No acute fracture or traumatic subluxation identified. There is reversed cervical lordosis, centered at C6 and likely degenerative. Lung apices are clear. Skull base and vertebrae: No acute fracture. No primary bone lesion or focal pathologic process. Soft tissues and spinal canal: No prevertebral fluid or swelling. No visible canal hematoma. Disc levels:  Degenerative changes described above. Upper chest: Negative. Other: None IMPRESSION: 1.  No evidence for acute intracranial abnormality. 2. Atrophy and small vessel disease. 3. Degenerative changes in the cervical spine associated with reversed cervical lordosis. 4. No evidence for acute cervical spine fracture. Electronically Signed   By: Nolon Nations M.D.   On: 06/23/2016 14:34   Ct Cervical Spine Wo Contrast  Result Date: 06/23/2016 CLINICAL DATA:  [place] this am 0200; was unable to get up; pt unaware if he hit his head; + LOC; no neck or head c/o EXAM: CT HEAD WITHOUT CONTRAST CT CERVICAL SPINE WITHOUT CONTRAST TECHNIQUE: Multidetector CT imaging of the head and cervical spine was performed following the standard protocol without intravenous contrast. Multiplanar CT image reconstructions of the cervical spine were also generated. COMPARISON:  01/03/2016 FINDINGS: CT HEAD FINDINGS Brain: There is central and cortical atrophy. Periventricular white matter changes are consistent with small vessel disease. There is no intra or extra-axial fluid collection or mass lesion. The basilar cisterns and ventricles have a  normal appearance. There is no CT evidence for acute infarction or hemorrhage. Vascular: No hyperdense vessel or unexpected calcification. Skull: Normal. Negative for fracture or focal lesion. Sinuses/Orbits: No acute finding. Other: None CT CERVICAL SPINE FINDINGS Alignment: There is significant degenerative change within the mid cervical spine, most notably at C4-5, C5-6, C6-7, and C7-T1. No acute fracture or traumatic subluxation identified. There is reversed cervical lordosis, centered at C6 and likely degenerative. Lung apices are clear. Skull base and vertebrae: No acute fracture. No primary bone lesion or focal pathologic process. Soft tissues and spinal canal: No prevertebral fluid or swelling. No visible canal hematoma. Disc levels:  Degenerative changes described above. Upper chest: Negative. Other: None IMPRESSION: 1.  No evidence for acute intracranial abnormality. 2. Atrophy and small vessel disease. 3. Degenerative changes in the cervical spine associated with reversed cervical lordosis. 4. No evidence for acute cervical spine fracture. Electronically Signed   By: Nolon Nations M.D.   On: 06/23/2016 14:34   Dg Hip Unilat With Pelvis 2-3 Views Left  Result Date: 06/23/2016 CLINICAL DATA:  Status post fall EXAM: DG HIP (WITH OR WITHOUT PELVIS) 2-3V LEFT COMPARISON:  Limited views of the pelvis and left hip of February 21, 2016 FINDINGS: The bones are subjectively mildly osteopenic. The pelvis exhibits no acute fracture. There is bony bridging across the symphysis pubis. AP and lateral views of the left hip reveal very mild symmetric narrowing of the joint space. No acetabular fracture is observed. The femoral head, neck, intertrochanteric, and immediate sub trochanteric regions are normal. There is considerable degenerative change of the lower lumbar spine. IMPRESSION: There is no acute bony abnormality of the left hip. There is mild symmetric joint space loss consistent with osteoarthritis.  Electronically Signed   By: David  Martinique M.D.   On: 06/23/2016 12:23   Dg Knee 3 Views Left  Result Date: 06/23/2016 CLINICAL DATA:  Status post fall EXAM: LEFT KNEE - 3 VIEW COMPARISON:  None in PACs FINDINGS: The bones are subjectively adequately mineralized. There is no acute fracture or dislocation.  There is very mild narrowing of the medial joint compartment. There is beaking of the tibial spines. Osteophytes arise from the articular margins of the patella and from the periphery of the medial tibial plateau. There is no joint effusion. There is popliteal artery calcification. IMPRESSION: There mild osteoarthritic changes centered on the medial and patellofemoral compartments. There is no acute bony abnormality. Electronically Signed   By: David  Martinique M.D.   On: 06/23/2016 12:21   Dg Knee 2 Views Right  Result Date: 06/23/2016 CLINICAL DATA:  Status post fall EXAM: RIGHT KNEE - 3 VIEW COMPARISON:  Right knee series dated February 17, 2010 FINDINGS: The bones are subjectively osteopenic. There is moderate joint space loss of the medial compartment which is stable. There is beaking of the tibial spines. Small spurs arise from the articular margins of the tibial plateaus. Large spurs arise from the articular margins of the patella. There is no acute fracture or dislocation and no joint effusion. There is popliteal artery calcification. IMPRESSION: Moderate osteoarthritic joint space loss of the medial compartment. Milder osteoarthritic changes laterally and of the patellofemoral compartment. No acute bony abnormality. Electronically Signed   By: David  Martinique M.D.   On: 06/23/2016 12:20    EKG: Independently reviewed. 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  DVT prophylaxis: Heparin Code Status: Full Family Communication: None at bedside Disposition Plan: Pending improvement in condition Consults called: None Admission status: Observation   Velvet Bathe MD [facility] Pager [phone]  If 7PM-7AM, please contact night-coverage [email or web address] Password [facility]  06/23/2016, 5:19 PM

## 2016-06-23 NOTE — ED Notes (Signed)
Critical I stat result given to EDP [facility]

## 2016-06-23 NOTE — ED Notes (Signed)
Unable to collect labs patient is not in the room 

## 2016-06-23 NOTE — ED Notes (Signed)
WILL TRANSPORT PT TO 4E 1415-1. AAOX4. PT IN NO APPARENT DISTRESS OR PAIN. THE OPPORTUNITY TO ASK QUESTIONS WAS PROVIDED.

## 2016-06-23 NOTE — ED Provider Notes (Signed)
[facility] DEPT Provider Note   CSN: [id number] Arrival date & time: 06/23/16  1006     History   Chief Complaint Chief Complaint  Patient presents with  . Weakness    generalized  . Fall    HPI Terry Baxter is a 70 y.o. male.  Patient is a 70 year old male with a history of bipolar disorder, chronic kidney disease, seizure disorder, diabetes, hypertension, hyperlipidemia and TIA who was found at home after a fall. He lives in a boarding home. He states that he has been feeling increasingly weak over the last 2-3 days. He describes as a general weakness versus unilateral weakness. He states he got up last night about 1:30 AM and he states he got up too fast and fell forward onto the ground. He does report a loss of consciousness which she says was brief. He denies any injuries from the fall. He denies any neck or back pain. He states he was unable to get up after the fall and went back to sleep. He woke up about 8 AM and was able to get some help. He denies any known fevers. No coughing or cold symptoms. No chest pain or palpitations. No shortness of breath.      Past Medical History:  Diagnosis Date  . Arthritis   . Bipolar 1 disorder ([place])    followed by dr headen every 2 months  . Chronic low back pain    goes to pain center  . CKD (chronic kidney disease), stage II   . Hepatitis   . History of balanitis   . History of hepatitis C    per pt treated w/ medication and cured  in 2014  . History of heroin abuse    per pt stopped and on was methadone until 10-19-2012--  none since  . History of transient ischemic attack (TIA)    11-06-2014  ?tia--  MRA/ MRI normal  . Hyperlipidemia   . Hypertension   . Nocturia   . Phimosis   . Seizure disorder [facility])    neurologist-  dr Rosalin Hawking--  last seizure 2014  . Seizures ([place])   . Type 2 diabetes, diet controlled ([place])   . Urinary incontinence    wear depends  . Wears dentures    upper  . Wears glasses     Patient  Active Problem List   Diagnosis Date Noted  . Sepsis ([place]) 06/23/2016  . HLD (hyperlipidemia) 02/08/2015  . Seizures ([place]) 02/08/2015  . Type 2 diabetes mellitus with other circulatory complications [id number]  . Stroke with cerebral ischemia ([place])   . Bipolar disorder ([place]) 11/10/2014  . History of opioid abuse 11/10/2014  . Neuropathy due to secondary diabetes mellitus ([place]) 11/10/2014  . TIA (transient ischemic attack) 11/06/2014  . Syncope 10/19/2013  . Cannabis abuse 10/19/2013  . Seizure disorder ([place]) 10/19/2013  . CKD (chronic kidney disease) stage 2, GFR 60-89 ml/min 10/19/2013  . Edema 05/05/2013  . Cardiomegaly 05/05/2013  . Hepatitis C     Past Surgical History:  Procedure Laterality Date  . BACK SURGERY    . CIRCUMCISION N/A 04/25/2016   Procedure: CIRCUMCISION ADULT;  Surgeon: Alexis Frock, MD;  Location: [facility];  Service: Urology;  Laterality: N/A;  . COLONOSCOPY  2013  . NEGATIVE SLEEP STUDY  2009  per pt  . TRANSTHORACIC ECHOCARDIOGRAM  11/06/2014   ef 45-50%/  trivial MR and TR       Home Medications    Prior  to Admission medications   Medication Sig Start Date End Date Taking? Authorizing Provider  atorvastatin (LIPITOR) 40 MG tablet Take 1 tablet (40 mg total) by mouth daily at 6 PM. Patient taking differently: Take 40 mg by mouth every evening.  11/09/14  Yes Charolette Forward, MD  buPROPion (WELLBUTRIN XL) 300 MG 24 hr tablet Take 300 mg by mouth daily with breakfast. 06/02/16  Yes Historical Provider, MD  busPIRone (BUSPAR) 10 MG tablet Take 10 mg by mouth daily with breakfast.  03/02/14  Yes Historical Provider, MD  clonazePAM (KLONOPIN) 0.5 MG tablet Take 0.5 mg by mouth 3 (three) times daily.  02/08/16  Yes Historical Provider, MD  docusate sodium (COLACE) 100 MG capsule Take 100 mg by mouth daily. 11/29/15  Yes Historical Provider, MD  doxepin (SINEQUAN) 10 MG capsule TAkE 2 CAPSULES BY MOUTH AT BEDTIME 07/07/15  Yes Historical  Provider, MD  gabapentin (NEURONTIN) 400 MG capsule Take 800 mg by mouth 3 (three) times daily.  02/07/16  Yes Historical Provider, MD  hydrochlorothiazide (MICROZIDE) 12.5 MG capsule Take 12.5 mg by mouth every evening.  01/03/15  Yes Historical Provider, MD  HYDROcodone-acetaminophen (NORCO) 10-325 MG tablet Take 1 tablet by mouth 3 (three) times daily as needed for moderate pain.  02/02/16  Yes Historical Provider, MD  influenza vac recom quadrivalent (FLUBLOK) 0.5 ML injection Inject 0.5 mLs into the muscle once.   Yes Historical Provider, MD  levETIRAcetam (KEPPRA) 500 MG tablet Take 500 mg by mouth 2 (two) times daily.   Yes Historical Provider, MD  Multiple Vitamin (MULTIVITAMIN WITH MINERALS) TABS tablet Take 1 tablet by mouth daily.   Yes Historical Provider, MD  Naproxen Sod-Diphenhydramine (ALEVE PM) 220-25 MG TABS Take 1-2 tablets by mouth at bedtime.    Yes Historical Provider, MD  risperiDONE (RISPERDAL) 1 MG tablet Take 1 mg by mouth 2 (two) times daily.  02/06/16  Yes Historical Provider, MD  tamsulosin (FLOMAX) 0.4 MG CAPS capsule Take 0.4 mg by mouth every evening. 12/11/15  Yes Historical Provider, MD  aspirin 325 MG tablet Take 1 tablet (325 mg total) by mouth daily. Patient not taking: Reported on 06/23/2016 11/09/14   Charolette Forward, MD  traMADol (ULTRAM) 50 MG tablet Take 1-2 tablets (50-100 mg total) by mouth every 6 (six) hours as needed for moderate pain. Post-operatively Patient not taking: Reported on 06/23/2016 04/25/16   Alexis Frock, MD    Family History No family history on file.  Social History Social History  Substance Use Topics  . Smoking status: Never Smoker  . Smokeless tobacco: Never Used  . Alcohol use No     Allergies   Tizanidine   Review of Systems Review of Systems  Constitutional: Positive for fatigue. Negative for chills, diaphoresis and fever.  HENT: Negative for congestion, rhinorrhea and sneezing.   Eyes: Negative.   Respiratory: Negative  for cough, chest tightness and shortness of breath.   Cardiovascular: Negative for chest pain and leg swelling.  Gastrointestinal: Negative for abdominal pain, blood in stool, diarrhea, nausea and vomiting.  Genitourinary: Negative for difficulty urinating, flank pain, frequency and hematuria.  Musculoskeletal: Positive for arthralgias. Negative for back pain.  Skin: Positive for wound. Negative for rash.  Neurological: Positive for weakness. Negative for dizziness, speech difficulty, numbness and headaches.     Physical Exam Updated Vital Signs BP 117/73   Pulse 84   Temp 98.1 F (36.7 C) (Rectal)   Resp 17   Ht 5\' 8"  (1.727 m)  Wt 273 lb (123.8 kg)   SpO2 100%   BMI 41.51 kg/m   Physical Exam  Constitutional: He is oriented to person, place, and time. He appears well-developed and well-nourished.  HENT:  Head: Normocephalic.  Patient has an abrasion to his chin and his forehead. No facial bone tenderness is noted.  Eyes: Pupils are equal, round, and reactive to light.  Neck: Normal range of motion. Neck supple.  No pain in the cervical thoracic or lumbosacral spine  Cardiovascular: Normal rate, regular rhythm and normal heart sounds.   Pulmonary/Chest: Effort normal and breath sounds normal. No respiratory distress. He has no wheezes. He has no rales. He exhibits no tenderness.  Abdominal: Soft. Bowel sounds are normal. There is no tenderness. There is no rebound and no guarding.  Musculoskeletal: Normal range of motion. He exhibits no edema.  Patient has abrasions across both knees with some pain on palpation. No known effusion is noted. He has some mild pain on range of motion of his left knee. There is no other pain on palpation or range of motion extremities. There is abrasions to the dorsum of his left hand and wrist without underlying bony tenderness.  Lymphadenopathy:    He has no cervical adenopathy.  Neurological: He is alert and oriented to person, place, and time.  He has normal strength. No cranial nerve deficit or sensory deficit. GCS eye subscore is 4. GCS verbal subscore is 5. GCS motor subscore is 6.  Motor 4/5 all extremities Sensation grossly intact to LT all extremities Finger to Nose intact, no pronator drift CN II-XII grossly intact    Skin: Skin is warm and dry. No rash noted.  Psychiatric: He has a normal mood and affect.     ED Treatments / Results  Labs (all labs ordered are listed, but only abnormal results are displayed) Labs Reviewed  CBC WITH DIFFERENTIAL/PLATELET - Abnormal; Notable for the following:       Result Value   WBC 16.3 (*)    Neutro Abs 14.1 (*)    Monocytes Absolute 1.4 (*)    All other components within normal limits  COMPREHENSIVE METABOLIC PANEL - Abnormal; Notable for the following:    CO2 21 (*)    BUN 22 (*)    Creatinine, Ser 1.41 (*)    Total Protein 8.4 (*)    AST 160 (*)    Total Bilirubin 1.3 (*)    GFR calc non Af Amer 50 (*)    GFR calc Af Amer 58 (*)    All other components within normal limits  URINALYSIS, ROUTINE W REFLEX MICROSCOPIC (NOT AT [facility]) - Abnormal; Notable for the following:    Color, Urine RED (*)    APPearance TURBID (*)    Hgb urine dipstick LARGE (*)    Bilirubin Urine SMALL (*)    Ketones, ur >80 (*)    Protein, ur 100 (*)    Nitrite POSITIVE (*)    Leukocytes, UA SMALL (*)    All other components within normal limits  TROPONIN I - Abnormal; Notable for the following:    Troponin I 0.12 (*)    All other components within normal limits  URINE MICROSCOPIC-ADD ON - Abnormal; Notable for the following:    Bacteria, UA MANY (*)    All other components within normal limits  I-STAT CG4 LACTIC ACID, ED - Abnormal; Notable for the following:    Lactic Acid, Venous 2.09 (*)    All other components within normal  limits  URINE CULTURE  CULTURE, BLOOD (ROUTINE X 2)  CULTURE, BLOOD (ROUTINE X 2)  CBG MONITORING, ED    EKG  EKG Interpretation  Date/Time:  Monday  June 23 2016 10:17:27 EST Ventricular Rate:  93 PR Interval:    QRS Duration: 85 QT Interval:  382 QTC Calculation: 476 R Axis:   -6 Text Interpretation:  Sinus rhythm Left atrial enlargement Borderline T abnormalities, anterior leads Borderline prolonged QT interval since last tracing no significant change Confirmed by Markise Haymer  MD, Aarit Kashuba ([id number]) on 06/23/2016 10:34:28 AM       Radiology Dg Chest 2 View  Result Date: 06/23/2016 CLINICAL DATA:  History of seizure activity. Found on the floor unresponsive with evidence of incontinence. No known trauma. EXAM: CHEST  2 VIEW COMPARISON:  Portable chest x-ray of October 19, 2013 FINDINGS: The lungs are adequately inflated. The interstitial markings are coarse at the lung bases but this is stable. There is no alveolar infiltrate, pleural effusion, or pneumothorax. The heart and pulmonary vascularity are normal. The mediastinum is normal in width. There is multilevel degenerative disc disease of the thoracic spine. IMPRESSION: Noactive cardiopulmonary disease. Electronically Signed   By: David  Martinique M.D.   On: 06/23/2016 12:19   Ct Head Wo Contrast  Result Date: 06/23/2016 CLINICAL DATA:  [place] this am 0200; was unable to get up; pt unaware if he hit his head; + LOC; no neck or head c/o EXAM: CT HEAD WITHOUT CONTRAST CT CERVICAL SPINE WITHOUT CONTRAST TECHNIQUE: Multidetector CT imaging of the head and cervical spine was performed following the standard protocol without intravenous contrast. Multiplanar CT image reconstructions of the cervical spine were also generated. COMPARISON:  01/03/2016 FINDINGS: CT HEAD FINDINGS Brain: There is central and cortical atrophy. Periventricular white matter changes are consistent with small vessel disease. There is no intra or extra-axial fluid collection or mass lesion. The basilar cisterns and ventricles have a normal appearance. There is no CT evidence for acute infarction or hemorrhage. Vascular: No hyperdense  vessel or unexpected calcification. Skull: Normal. Negative for fracture or focal lesion. Sinuses/Orbits: No acute finding. Other: None CT CERVICAL SPINE FINDINGS Alignment: There is significant degenerative change within the mid cervical spine, most notably at C4-5, C5-6, C6-7, and C7-T1. No acute fracture or traumatic subluxation identified. There is reversed cervical lordosis, centered at C6 and likely degenerative. Lung apices are clear. Skull base and vertebrae: No acute fracture. No primary bone lesion or focal pathologic process. Soft tissues and spinal canal: No prevertebral fluid or swelling. No visible canal hematoma. Disc levels:  Degenerative changes described above. Upper chest: Negative. Other: None IMPRESSION: 1.  No evidence for acute intracranial abnormality. 2. Atrophy and small vessel disease. 3. Degenerative changes in the cervical spine associated with reversed cervical lordosis. 4. No evidence for acute cervical spine fracture. Electronically Signed   By: Nolon Nations M.D.   On: 06/23/2016 14:34   Ct Cervical Spine Wo Contrast  Result Date: 06/23/2016 CLINICAL DATA:  [place] this am 0200; was unable to get up; pt unaware if he hit his head; + LOC; no neck or head c/o EXAM: CT HEAD WITHOUT CONTRAST CT CERVICAL SPINE WITHOUT CONTRAST TECHNIQUE: Multidetector CT imaging of the head and cervical spine was performed following the standard protocol without intravenous contrast. Multiplanar CT image reconstructions of the cervical spine were also generated. COMPARISON:  01/03/2016 FINDINGS: CT HEAD FINDINGS Brain: There is central and cortical atrophy. Periventricular white matter changes are consistent with small vessel disease.  There is no intra or extra-axial fluid collection or mass lesion. The basilar cisterns and ventricles have a normal appearance. There is no CT evidence for acute infarction or hemorrhage. Vascular: No hyperdense vessel or unexpected calcification. Skull: Normal.  Negative for fracture or focal lesion. Sinuses/Orbits: No acute finding. Other: None CT CERVICAL SPINE FINDINGS Alignment: There is significant degenerative change within the mid cervical spine, most notably at C4-5, C5-6, C6-7, and C7-T1. No acute fracture or traumatic subluxation identified. There is reversed cervical lordosis, centered at C6 and likely degenerative. Lung apices are clear. Skull base and vertebrae: No acute fracture. No primary bone lesion or focal pathologic process. Soft tissues and spinal canal: No prevertebral fluid or swelling. No visible canal hematoma. Disc levels:  Degenerative changes described above. Upper chest: Negative. Other: None IMPRESSION: 1.  No evidence for acute intracranial abnormality. 2. Atrophy and small vessel disease. 3. Degenerative changes in the cervical spine associated with reversed cervical lordosis. 4. No evidence for acute cervical spine fracture. Electronically Signed   By: Nolon Nations M.D.   On: 06/23/2016 14:34   Dg Hip Unilat With Pelvis 2-3 Views Left  Result Date: 06/23/2016 CLINICAL DATA:  Status post fall EXAM: DG HIP (WITH OR WITHOUT PELVIS) 2-3V LEFT COMPARISON:  Limited views of the pelvis and left hip of February 21, 2016 FINDINGS: The bones are subjectively mildly osteopenic. The pelvis exhibits no acute fracture. There is bony bridging across the symphysis pubis. AP and lateral views of the left hip reveal very mild symmetric narrowing of the joint space. No acetabular fracture is observed. The femoral head, neck, intertrochanteric, and immediate sub trochanteric regions are normal. There is considerable degenerative change of the lower lumbar spine. IMPRESSION: There is no acute bony abnormality of the left hip. There is mild symmetric joint space loss consistent with osteoarthritis. Electronically Signed   By: David  Martinique M.D.   On: 06/23/2016 12:23   Dg Knee 3 Views Left  Result Date: 06/23/2016 CLINICAL DATA:  Status post fall EXAM:  LEFT KNEE - 3 VIEW COMPARISON:  None in PACs FINDINGS: The bones are subjectively adequately mineralized. There is no acute fracture or dislocation. There is very mild narrowing of the medial joint compartment. There is beaking of the tibial spines. Osteophytes arise from the articular margins of the patella and from the periphery of the medial tibial plateau. There is no joint effusion. There is popliteal artery calcification. IMPRESSION: There mild osteoarthritic changes centered on the medial and patellofemoral compartments. There is no acute bony abnormality. Electronically Signed   By: David  Martinique M.D.   On: 06/23/2016 12:21   Dg Knee 2 Views Right  Result Date: 06/23/2016 CLINICAL DATA:  Status post fall EXAM: RIGHT KNEE - 3 VIEW COMPARISON:  Right knee series dated February 17, 2010 FINDINGS: The bones are subjectively osteopenic. There is moderate joint space loss of the medial compartment which is stable. There is beaking of the tibial spines. Small spurs arise from the articular margins of the tibial plateaus. Large spurs arise from the articular margins of the patella. There is no acute fracture or dislocation and no joint effusion. There is popliteal artery calcification. IMPRESSION: Moderate osteoarthritic joint space loss of the medial compartment. Milder osteoarthritic changes laterally and of the patellofemoral compartment. No acute bony abnormality. Electronically Signed   By: David  Martinique M.D.   On: 06/23/2016 12:20    Procedures Procedures (including critical care time)  Medications Ordered in ED Medications  cefTRIAXone (ROCEPHIN)  2 g in dextrose 5 % 50 mL IVPB (2 g Intravenous New Bag/Given 06/23/16 1452)  sodium chloride 0.9 % bolus 500 mL (500 mLs Intravenous New Bag/Given 06/23/16 1452)  cefTRIAXone (ROCEPHIN) 1 g in dextrose 5 % 50 mL IVPB (not administered)  sodium chloride 0.9 % bolus 500 mL (0 mLs Intravenous Stopped 06/23/16 1500)     Initial Impression / Assessment  and Plan / ED Course  I have reviewed the triage vital signs and the nursing notes.  Pertinent labs & imaging results that were available during my care of the patient were reviewed by me and considered in my medical decision making (see chart for details).  Clinical Course     Patient presents after a fall with generalized weakness. There is no unilateral deficits that would be more indicative of a stroke. His head CT is negative for intracranial hemorrhage. There is no other bony injuries noted. He does have evidence of sepsis with an elevated lactate and generalized weakness. He has a markedly elevated WBC count. He has not been hypotensive and his lactate is not greater than 4, so a full 30 cc/kg bolus was not initiated. He was given IV fluids and antibiotics however. Urine is likely source of infection. His chest x-ray is without signs of pneumonia. I spoke with Dr. Wendee Beavers who will admit the patient for further treatment.  Final Clinical Impressions(s) / ED Diagnoses   Final diagnoses:  Fall  Sepsis, due to unspecified organism [facility])  Urinary tract infection with hematuria, site unspecified  Multiple abrasions    New Prescriptions New Prescriptions   No medications on file     Malvin Johns, MD 06/23/16 [phone]

## 2016-06-23 NOTE — ED Triage Notes (Signed)
Per [facility]- Pt resides at home. Pt without complaint last night. Pt awoke this AM on the floor. EMS observed incontinence of urine with strong odor. HX of seizure. No trauma noted. Pt denies neck and back pain. SCCA cleared.Pupils 30mm and sluggish. Pt takes opiates for pain control. Neuro intact. NEG STROKE.

## 2016-06-24 DIAGNOSIS — N390 Urinary tract infection, site not specified: Secondary | ICD-10-CM | POA: Diagnosis not present

## 2016-06-24 DIAGNOSIS — A419 Sepsis, unspecified organism: Secondary | ICD-10-CM | POA: Diagnosis not present

## 2016-06-24 DIAGNOSIS — E1340 Other specified diabetes mellitus with diabetic neuropathy, unspecified: Secondary | ICD-10-CM | POA: Diagnosis not present

## 2016-06-24 DIAGNOSIS — G40909 Epilepsy, unspecified, not intractable, without status epilepticus: Secondary | ICD-10-CM

## 2016-06-24 DIAGNOSIS — N182 Chronic kidney disease, stage 2 (mild): Secondary | ICD-10-CM | POA: Diagnosis not present

## 2016-06-24 LAB — BASIC METABOLIC PANEL
Anion gap: 8 (ref 5–15)
BUN: 21 mg/dL — AB (ref 6–20)
CALCIUM: 8.8 mg/dL — AB (ref 8.9–10.3)
CO2: 21 mmol/L — ABNORMAL LOW (ref 22–32)
CREATININE: 1.12 mg/dL (ref 0.61–1.24)
Chloride: 111 mmol/L (ref 101–111)
GFR calc non Af Amer: >60 mL/min (ref >60)
Glucose, Bld: 113 mg/dL — ABNORMAL HIGH (ref 65–99)
Potassium: 3.7 mmol/L (ref 3.5–5.1)
SODIUM: 140 mmol/L (ref 135–145)

## 2016-06-24 LAB — GLUCOSE, CAPILLARY
GLUCOSE-CAPILLARY: 127 mg/dL — AB (ref 65–99)
Glucose-Capillary: 158 mg/dL — ABNORMAL HIGH (ref 65–99)

## 2016-06-24 LAB — CBC
HCT: 38.7 % — ABNORMAL LOW (ref 39.0–52.0)
Hemoglobin: 13.6 g/dL (ref 13.0–17.0)
MCH: 32 pg (ref 26.0–34.0)
MCHC: 35.1 g/dL (ref 30.0–36.0)
MCV: 91.1 fL (ref 78.0–100.0)
PLATELETS: UNDETERMINED 10*3/uL (ref 150–400)
RBC: 4.25 MIL/uL (ref 4.22–5.81)
RDW: 14.4 % (ref 11.5–15.5)
WBC: 11 10*3/uL — AB (ref 4.0–10.5)

## 2016-06-24 MED ORDER — CEFUROXIME AXETIL 250 MG PO TABS: 250.0000 mg | ORAL_TABLET | Freq: Two times a day (BID) | ORAL | 0 refills | Status: DC

## 2016-06-24 NOTE — Discharge Summary (Signed)
Physician Discharge Summary  Terry Baxter [id number] DOB: 08-Jun-1946 DOA: 06/23/2016  PCP: Patricia Nettle, MD  Admit date: 06/23/2016 Discharge date: 06/24/2016  Admitted From: Home Disposition:  Home  Recommendations for Outpatient Follow-up:  1. Follow up with PCP in 1-2 weeks 2. Recommend repeat liver function test in 2-3 weeks 3. Please follow up on the following pending results: 1. Urine culture sensitivities from 06/23/16   Discharge Condition:Improved CODE STATUS:Full Diet recommendation: Regular   Brief/Interim Summary: 70 y.o. male with medical history significant of PMH listed below. Patient is presenting with generalized weakness which has been getting worse for the last couple days per my discussion with him. Patient also endorses polyuria but no dysuria. The problem has been persistent since onset. Problem has been gradually getting worse. 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   aspirin 325 MG tablet Take 1 tablet (325 mg total) by mouth daily.   atorvastatin 40 MG tablet Commonly known as:  LIPITOR Take 1 tablet (40 mg total) by mouth daily at 6 PM. What changed:  when to take this   buPROPion 300 MG 24 hr tablet Commonly known as:  WELLBUTRIN XL Take 300 mg by mouth daily with breakfast.   busPIRone 10 MG tablet Commonly known as:  BUSPAR Take 10 mg by mouth daily with breakfast.   cefUROXime 250 MG tablet Commonly known as:  CEFTIN Take 1 tablet (250 mg total) by mouth 2 (two) times daily with a meal. Please start 06/25/16   clonazePAM 0.5 MG tablet Commonly known as:  KLONOPIN Take 0.5 mg by mouth 3 (three) times daily.   docusate sodium 100 MG capsule Commonly known as:  COLACE Take 100 mg by mouth daily.   doxepin 10 MG capsule Commonly known as:  SINEQUAN TAkE 2 CAPSULES BY MOUTH AT BEDTIME   gabapentin 400 MG capsule Commonly known as:  NEURONTIN Take 800 mg by mouth 3 (three) times daily.   hydrochlorothiazide 12.5 MG capsule Commonly known as:  MICROZIDE Take 12.5 mg by mouth every evening.   HYDROcodone-acetaminophen 10-325 MG tablet Commonly known as:  NORCO Take 1 tablet by mouth 3 (three) times daily as needed for moderate pain.   influenza vac recom quadrivalent 0.5 ML injection Commonly known as:  FLUBLOK Inject 0.5 mLs into the muscle once.   levETIRAcetam 500 MG tablet Commonly known as:  KEPPRA Take 500  mg by mouth 2 (two) times daily.   multivitamin with minerals Tabs tablet Take 1 tablet by mouth daily.   risperiDONE 1 MG tablet Commonly known as:  RISPERDAL Take 1 mg by mouth 2 (two) times daily.   tamsulosin 0.4 MG Caps capsule Commonly known as:  FLOMAX Take 0.4 mg by mouth every evening.   traMADol 50 MG tablet Commonly known as:  ULTRAM Take 1-2 tablets (50-100 mg total) by mouth every 6  (six) hours as needed for moderate pain. Post-operatively       Allergies  Allergen Reactions  . Tizanidine Other (See Comments)    dizziness    Procedures/Studies: Dg Chest 2 View  Result Date: 06/23/2016 CLINICAL DATA:  History of seizure activity. Found on the floor unresponsive with evidence of incontinence. No known trauma. EXAM: CHEST  2 VIEW COMPARISON:  Portable chest x-ray of October 19, 2013 FINDINGS: The lungs are adequately inflated. The interstitial markings are coarse at the lung bases but this is stable. There is no alveolar infiltrate, pleural effusion, or pneumothorax. The heart and pulmonary vascularity are normal. The mediastinum is normal in width. There is multilevel degenerative disc disease of the thoracic spine. IMPRESSION: Noactive cardiopulmonary disease. Electronically Signed   By: David  Martinique M.D.   On: 06/23/2016 12:19   Ct Head Wo Contrast  Result Date: 06/23/2016 CLINICAL DATA:  [place] this am 0200; was unable to get up; pt unaware if he hit his head; + LOC; no neck or head c/o EXAM: CT HEAD WITHOUT CONTRAST CT CERVICAL SPINE WITHOUT CONTRAST TECHNIQUE: Multidetector CT imaging of the head and cervical spine was performed following the standard protocol without intravenous contrast. Multiplanar CT image reconstructions of the cervical spine were also generated. COMPARISON:  01/03/2016 FINDINGS: CT HEAD FINDINGS Brain: There is central and cortical atrophy. Periventricular white matter changes are consistent with small vessel disease. There is no intra or extra-axial fluid collection or mass lesion. The basilar cisterns and ventricles have a normal appearance. There is no CT evidence for acute infarction or hemorrhage. Vascular: No hyperdense vessel or unexpected calcification. Skull: Normal. Negative for fracture or focal lesion. Sinuses/Orbits: No acute finding. Other: None CT CERVICAL SPINE FINDINGS Alignment: There is significant degenerative change within the mid  cervical spine, most notably at C4-5, C5-6, C6-7, and C7-T1. No acute fracture or traumatic subluxation identified. There is reversed cervical lordosis, centered at C6 and likely degenerative. Lung apices are clear. Skull base and vertebrae: No acute fracture. No primary bone lesion or focal pathologic process. Soft tissues and spinal canal: No prevertebral fluid or swelling. No visible canal hematoma. Disc levels:  Degenerative changes described above. Upper chest: Negative. Other: None IMPRESSION: 1.  No evidence for acute intracranial abnormality. 2. Atrophy and small vessel disease. 3. Degenerative changes in the cervical spine associated with reversed cervical lordosis. 4. No evidence for acute cervical spine fracture. Electronically Signed   By: Nolon Nations M.D.   On: 06/23/2016 14:34   Ct Cervical Spine Wo Contrast  Result Date: 06/23/2016 CLINICAL DATA:  [place] this am 0200; was unable to get up; pt unaware if he hit his head; + LOC; no neck or head c/o EXAM: CT HEAD WITHOUT CONTRAST CT CERVICAL SPINE WITHOUT CONTRAST TECHNIQUE: Multidetector CT imaging of the head and cervical spine was performed following the standard protocol without intravenous contrast. Multiplanar CT image reconstructions of the cervical spine were also generated. COMPARISON:  01/03/2016 FINDINGS: CT HEAD FINDINGS Brain: There is central and cortical  atrophy. Periventricular white matter changes are consistent with small vessel disease. There is no intra or extra-axial fluid collection or mass lesion. The basilar cisterns and ventricles have a normal appearance. There is no CT evidence for acute infarction or hemorrhage. Vascular: No hyperdense vessel or unexpected calcification. Skull: Normal. Negative for fracture or focal lesion. Sinuses/Orbits: No acute finding. Other: None CT CERVICAL SPINE FINDINGS Alignment: There is significant degenerative change within the mid cervical spine, most notably at C4-5, C5-6, C6-7, and  C7-T1. No acute fracture or traumatic subluxation identified. There is reversed cervical lordosis, centered at C6 and likely degenerative. Lung apices are clear. Skull base and vertebrae: No acute fracture. No primary bone lesion or focal pathologic process. Soft tissues and spinal canal: No prevertebral fluid or swelling. No visible canal hematoma. Disc levels:  Degenerative changes described above. Upper chest: Negative. Other: None IMPRESSION: 1.  No evidence for acute intracranial abnormality. 2. Atrophy and small vessel disease. 3. Degenerative changes in the cervical spine associated with reversed cervical lordosis. 4. No evidence for acute cervical spine fracture. Electronically Signed   By: Nolon Nations M.D.   On: 06/23/2016 14:34   Dg Hip Unilat With Pelvis 2-3 Views Left  Result Date: 06/23/2016 CLINICAL DATA:  Status post fall EXAM: DG HIP (WITH OR WITHOUT PELVIS) 2-3V LEFT COMPARISON:  Limited views of the pelvis and left hip of February 21, 2016 FINDINGS: The bones are subjectively mildly osteopenic. The pelvis exhibits no acute fracture. There is bony bridging across the symphysis pubis. AP and lateral views of the left hip reveal very mild symmetric narrowing of the joint space. No acetabular fracture is observed. The femoral head, neck, intertrochanteric, and immediate sub trochanteric regions are normal. There is considerable degenerative change of the lower lumbar spine. IMPRESSION: There is no acute bony abnormality of the left hip. There is mild symmetric joint space loss consistent with osteoarthritis. Electronically Signed   By: David  Martinique M.D.   On: 06/23/2016 12:23   Dg Knee 3 Views Left  Result Date: 06/23/2016 CLINICAL DATA:  Status post fall EXAM: LEFT KNEE - 3 VIEW COMPARISON:  None in PACs FINDINGS: The bones are subjectively adequately mineralized. There is no acute fracture or dislocation. There is very mild narrowing of the medial joint compartment. There is beaking of  the tibial spines. Osteophytes arise from the articular margins of the patella and from the periphery of the medial tibial plateau. There is no joint effusion. There is popliteal artery calcification. IMPRESSION: There mild osteoarthritic changes centered on the medial and patellofemoral compartments. There is no acute bony abnormality. Electronically Signed   By: David  Martinique M.D.   On: 06/23/2016 12:21   Dg Knee 2 Views Right  Result Date: 06/23/2016 CLINICAL DATA:  Status post fall EXAM: RIGHT KNEE - 3 VIEW COMPARISON:  Right knee series dated February 17, 2010 FINDINGS: The bones are subjectively osteopenic. There is moderate joint space loss of the medial compartment which is stable. There is beaking of the tibial spines. Small spurs arise from the articular margins of the tibial plateaus. Large spurs arise from the articular margins of the patella. There is no acute fracture or dislocation and no joint effusion. There is popliteal artery calcification. IMPRESSION: Moderate osteoarthritic joint space loss of the medial compartment. Milder osteoarthritic changes laterally and of the patellofemoral compartment. No acute bony abnormality. Electronically Signed   By: David  Martinique M.D.   On: 06/23/2016 12:20    Subjective: Eager to go  home  Discharge Exam: Vitals:   06/23/16 2025 06/24/16 0500  BP: 110/66 120/70  Pulse: 82 79  Resp: 17 16  Temp: 98.2 F (36.8 C) 98.9 F (37.2 C)   Vitals:   06/23/16 1630 06/23/16 1656 06/23/16 2025 06/24/16 0500  BP: 130/85 (!) 141/85 110/66 120/70  Pulse: 86 89 82 79  Resp: 15 16 17 16   Temp:  98.3 F (36.8 C) 98.2 F (36.8 C) 98.9 F (37.2 C)  TempSrc:  Oral Oral Oral  SpO2: 99% 99% 99% 97%  Weight:  95.5 kg (210 lb 9.6 oz)    Height:  5\' 8"  (1.727 m)      General: Pt is alert, awake, not in acute distress Cardiovascular: RRR, S1/S2 +, no rubs, no gallops Respiratory: CTA bilaterally, no wheezing, no rhonchi Abdominal: Soft, NT, ND, bowel  sounds + Extremities: no edema, no cyanosis   The results of significant diagnostics from this hospitalization (including imaging, microbiology, ancillary and laboratory) are listed below for reference.     Microbiology: Recent Results (from the past 240 hour(s))  Urine culture     Status: Abnormal (Preliminary result)   Collection Time: 06/23/16  1:22 PM  Result Value Ref Range Status   Specimen Description URINE, RANDOM  Final   Special Requests NONE  Final   Culture >=100,000 COLONIES/mL ESCHERICHIA COLI (A)  Final   Report Status PENDING  Incomplete  Blood Culture (routine x 2)     Status: None (Preliminary result)   Collection Time: 06/23/16  2:45 PM  Result Value Ref Range Status   Specimen Description BLOOD RIGHT ANTECUBITAL  Final   Special Requests IN PEDIATRIC BOTTLE 1 CC  Final   Culture   Final    NO GROWTH < 24 HOURS Performed at [facility]    Report Status PENDING  Incomplete  Blood Culture (routine x 2)     Status: None (Preliminary result)   Collection Time: 06/23/16  6:00 PM  Result Value Ref Range Status   Specimen Description BLOOD RIGHT HAND  Final   Special Requests BOTTLES DRAWN AEROBIC ONLY [place]  Final   Culture   Final    NO GROWTH < 24 HOURS Performed at [facility]    Report Status PENDING  Incomplete     Labs: BNP (last 3 results) No results for input(s): BNP in the last 8760 hours. Basic Metabolic Panel:  Recent Labs Lab 06/23/16 1241 06/24/16 0530  NA 141 140  K 3.6 3.7  CL 108 111  CO2 21* 21*  GLUCOSE 91 113*  BUN 22* 21*  CREATININE 1.41* 1.12  CALCIUM 9.4 8.8*   Liver Function Tests:  Recent Labs Lab 06/23/16 1241  AST 160*  ALT 30  ALKPHOS 73  BILITOT 1.3*  PROT 8.4*  ALBUMIN 4.3   No results for input(s): LIPASE, AMYLASE in the last 168 hours. No results for input(s): AMMONIA in the last 168 hours. CBC:  Recent Labs Lab 06/23/16 1241 06/24/16 0530  WBC 16.3* 11.0*  NEUTROABS 14.1*  --    HGB 14.6 13.6  HCT 43.0 38.7*  MCV 93.7 91.1  PLT 166 PLATELET CLUMPS NOTED ON SMEAR, UNABLE TO ESTIMATE   Cardiac Enzymes:  Recent Labs Lab 06/23/16 1241  TROPONINI 0.12*   BNP: Invalid input(s): POCBNP CBG:  Recent Labs Lab 06/23/16 1241 06/24/16 0912 06/24/16 1202  GLUCAP 97 127* 158*   D-Dimer No results for input(s): DDIMER in the last 72 hours. Hgb A1c No  results for input(s): HGBA1C in the last 72 hours. Lipid Profile No results for input(s): CHOL, HDL, LDLCALC, TRIG, CHOLHDL, LDLDIRECT in the last 72 hours. Thyroid function studies No results for input(s): TSH, T4TOTAL, T3FREE, THYROIDAB in the last 72 hours.  Invalid input(s): FREET3 Anemia work up No results for input(s): VITAMINB12, FOLATE, FERRITIN, TIBC, IRON, RETICCTPCT in the last 72 hours. Urinalysis    Component Value Date/Time   COLORURINE RED (A) 06/23/2016 1322   APPEARANCEUR TURBID (A) 06/23/2016 1322   LABSPEC 1.029 06/23/2016 1322   PHURINE 5.5 06/23/2016 1322   GLUCOSEU NEGATIVE 06/23/2016 1322   HGBUR LARGE (A) 06/23/2016 1322   BILIRUBINUR SMALL (A) 06/23/2016 1322   KETONESUR >80 (A) 06/23/2016 1322   PROTEINUR 100 (A) 06/23/2016 1322   UROBILINOGEN 1.0 11/07/2014 0511   NITRITE POSITIVE (A) 06/23/2016 1322   LEUKOCYTESUR SMALL (A) 06/23/2016 1322   Sepsis Labs Invalid input(s): PROCALCITONIN,  WBC,  LACTICIDVEN Microbiology Recent Results (from the past 240 hour(s))  Urine culture     Status: Abnormal (Preliminary result)   Collection Time: 06/23/16  1:22 PM  Result Value Ref Range Status   Specimen Description URINE, RANDOM  Final   Special Requests NONE  Final   Culture >=100,000 COLONIES/mL ESCHERICHIA COLI (A)  Final   Report Status PENDING  Incomplete  Blood Culture (routine x 2)     Status: None (Preliminary result)   Collection Time: 06/23/16  2:45 PM  Result Value Ref Range Status   Specimen Description BLOOD RIGHT ANTECUBITAL  Final   Special Requests IN  PEDIATRIC BOTTLE 1 CC  Final   Culture   Final    NO GROWTH < 24 HOURS Performed at [facility]    Report Status PENDING  Incomplete  Blood Culture (routine x 2)     Status: None (Preliminary result)   Collection Time: 06/23/16  6:00 PM  Result Value Ref Range Status   Specimen Description BLOOD RIGHT HAND  Final   Special Requests BOTTLES DRAWN AEROBIC ONLY [place]  Final   Culture   Final    NO GROWTH < 24 HOURS Performed at [facility]    Report Status PENDING  Incomplete     SIGNED:   Donne Hazel, MD  [facility] 06/24/2016, 3:09 PM  If 7PM-7AM, please contact night-coverage [email or web address] Password [email or web address]

## 2016-06-24 NOTE — Care Management Note (Signed)
Case Management Note  Patient Details  Name: Terry Baxter MRN: [id number] Date of Birth: Feb 09, 1946  Subjective/Objective: 70 y/o m admitted w/Sepsis. From home. PT cons-awaiting recc. Patient used [facility] in pst will use again. [facility] rep aware of potential referral.                   Action/Plan:d/c plan home.   Expected Discharge Date:   (unknown)               Expected Discharge Plan:  Home/Self Care  In-House Referral:     Discharge planning Services  CM Consult  Post Acute Care Choice:    Choice offered to:  Patient  DME Arranged:    DME Agency:     HH Arranged:    [place] Agency:     Status of Service:  In process, will continue to follow  If discussed at Long Length of Stay Meetings, dates discussed:    Additional Comments:  Dessa Phi, RN 06/24/2016, 1:57 PM

## 2016-06-24 NOTE — Care Management Obs Status (Signed)
[place] NOTIFICATION   Patient Details  Name: Terry Baxter MRN: [id number] Date of Birth: 1945/11/01   Medicare Observation Status Notification Given:  Yes    MahabirJuliann Pulse, RN 06/24/2016, 1:57 PM

## 2016-06-24 NOTE — Progress Notes (Signed)
CSW received consult for homelessness. CSW spoke with patient who states that he stays in a room in a house on [address] & is able to return there, but that his roommate wants him to move out. CSW provided patient with information on [facility]) and area shelters. CSW encouraged patient to go and speak with someone at [facility] re: housing.   No further CSW needs identified - CSW signing off.   Raynaldo Opitz, [facility] Clinical Social Worker cell #: [phone]

## 2016-06-25 LAB — URINE CULTURE: Culture: >=100000 — AB

## 2016-06-28 LAB — CULTURE, BLOOD (ROUTINE X 2)
Culture: NO GROWTH
Culture: NO GROWTH

## 2016-07-08 ENCOUNTER — Encounter: Payer: Medicare Other | Attending: General Surgery | Admitting: Dietician

## 2016-07-08 DIAGNOSIS — Z713 Dietary counseling and surveillance: Secondary | ICD-10-CM | POA: Diagnosis not present

## 2016-07-08 DIAGNOSIS — Z6841 Body Mass Index (BMI) 40.0 and over, adult: Secondary | ICD-10-CM | POA: Insufficient documentation

## 2016-07-08 DIAGNOSIS — E669 Obesity, unspecified: Secondary | ICD-10-CM | POA: Insufficient documentation

## 2016-07-08 NOTE — Progress Notes (Signed)
  6 Months Supervised Weight Loss Visit:   Pre-Operative Sleeve Gastrectomy Surgery  Medical Nutrition Therapy:  Appt start time: 1010 end time:  1025  Primary concerns today: Supervised Weight Loss Visit. Terry Baxter returns today a 1 lb weight loss since month. Has started doing some chair exercises and some walking and stairs at home.   Had a fall last month but did not have any injuries.  Has not tried Premier protein yet.   Feels like chewing is going well (chewing 20 times per bite). Has also been practicing not drinking during meals.   Weight: 272.2 lbs BMI: 40.2  Preferred Learning Style:   No preference indicated   Learning Readiness:   Ready  24-hr recall: B (AM): Ensure  Snk (AM): none L (PM): peanut butter sandwich on rye bread Snk (PM): none D (PM): Lean Cuisine Snk (PM): none  Beverages: water 2 liters per day, coffee (seldom)  Medications: see list   Recent physical activity:  Walks in hall at home 3 x day and does chair exercises  Progress Towards Goal(s):  In progress.  Handouts given during visit include:  None  Supplements given during visit include:  [employer], lot# [id number], exp 05/03/2017   Nutritional Diagnosis:  [place]-3.3 Obesity related to past poor dietary habits and physical inactivity as evidenced by patient attending supervised weight loss for insurance approval of bariatric surgery.    Intervention:  Nutrition counseling provided. Plan: Pharmacist, community. Continue working on chewing well and not drinking during meals.  Continue exercising each day.  Teaching Method Utilized:  Visual Auditory Hands on  Barriers to learning/adherence to lifestyle change: none  Demonstrated degree of understanding via:  Teach Back   Monitoring/Evaluation:  Dietary intake, exercise, and body weight. Follow up for SWL or  to attend pre op class.

## 2016-07-08 NOTE — Patient Instructions (Addendum)
Try Community education officer. Continue working on chewing well and not drinking during meals.  Continue exercising each day.  Call [facility] to check on appointments: [phone]

## 2016-08-07 ENCOUNTER — Ambulatory Visit: Payer: Self-pay | Admitting: Skilled Nursing Facility1

## 2016-08-08 ENCOUNTER — Encounter: Payer: Self-pay | Admitting: Skilled Nursing Facility1

## 2016-08-08 ENCOUNTER — Encounter: Payer: Medicare Other | Attending: General Surgery | Admitting: Skilled Nursing Facility1

## 2016-08-08 DIAGNOSIS — Z713 Dietary counseling and surveillance: Secondary | ICD-10-CM | POA: Diagnosis present

## 2016-08-08 DIAGNOSIS — E669 Obesity, unspecified: Secondary | ICD-10-CM | POA: Insufficient documentation

## 2016-08-08 DIAGNOSIS — E6609 Other obesity due to excess calories: Secondary | ICD-10-CM

## 2016-08-08 DIAGNOSIS — Z6841 Body Mass Index (BMI) 40.0 and over, adult: Secondary | ICD-10-CM | POA: Insufficient documentation

## 2016-08-08 NOTE — Progress Notes (Addendum)
  6 Months Supervised Weight Loss Visit:   Pre-Operative Sleeve Gastrectomy Surgery  Medical Nutrition Therapy:  Appt start time: 1010 end time:  1025  Primary concerns today: Supervised Weight Loss Visit. Mehran returns today maintaining wt since last month. Has started doing some chair exercises and some walking and stairs at home.   Pt states he tried premier protein which he likes okay.    Weight: 272 pounds BMI: 40 Feels like chewing is going well (chewing 20 times per bite). Has also been practicing not drinking during meals. Pt states he has 2 more appointments.  Preferred Learning Style:   No preference indicated   Learning Readiness:   Ready  24-hr recall: B (AM): premeir shake  Snk (AM): none L (PM): peanut butter sandwich on rye bread Snk (PM): none D (PM): Lean Cuisine Snk (PM): none  Beverages: water 2 liters per day, coffee (seldom)  Medications: see list   Recent physical activity:  Walks in hall at home 3 x day and does chair exercises -Follow diet recommendations listed below   Energy and Macronutrient Recomendations: Calories: 1800 Carbohydrate: 200 Protein: 135 Fat: 50  Progress Towards Goal(s):  In progress.  Handouts given during visit include:  None  Supplements given during visit include:  [employer], lot# [id number], exp 05/03/2017   Nutritional Diagnosis:  [place]-3.3 Obesity related to past poor dietary habits and physical inactivity as evidenced by patient attending supervised weight loss for insurance approval of bariatric surgery.    Intervention:  Nutrition counseling provided. Plan: Continue working on chewing well and not drinking during meals.  Continue exercising each day.  Teaching Method Utilized:  Visual Auditory Hands on  Barriers to learning/adherence to lifestyle change: none  Demonstrated degree of understanding via:  Teach Back   Monitoring/Evaluation:  Dietary intake, exercise, and body weight. Follow up for  SWL or  to attend pre op class.

## 2016-09-04 ENCOUNTER — Ambulatory Visit: Payer: Self-pay | Admitting: Skilled Nursing Facility1

## 2016-09-04 NOTE — Progress Notes (Signed)
  6 Months Supervised Weight Loss Visit:   Pre-Operative Sleeve Gastrectomy Surgery  Medical Nutrition Therapy:  Appt start time: 0830 end time:  0845.  Primary concerns today: Supervised Weight Loss Visit. Terry Baxter returns today with no weight change since last month. Has one more appointment with PCP before surgery. He is going to check with [facility] to see if he has completed enough appointments.  Has been working on walking more this past month in his hallway.  Has been practicing chewing well. Has also been practicing not drinking during meals.   Weight: 273.4 lbs BMI: 40.37 -Follow diet recommendations listed below   Energy and Macronutrient Recomendations: Calories: 1800 Carbohydrate: 200 Protein: 135 Fat: 50  Preferred Learning Style:   No preference indicated   Learning Readiness:   Ready  24-hr recall: B (AM): Ensure  Snk (AM): none L (PM): peanut butter sandwich on rye bread Snk (PM): none D (PM): Lean Cuisine Snk (PM): none  Beverages: water 2 liters per day, coffee (seldom)  Medications: see list   Recent physical activity:  Walks in hall at home.   Progress Towards Goal(s):  In progress.  Handouts given during visit include:  none   Nutritional Diagnosis:  [place]-3.3 Obesity related to past poor dietary habits and physical inactivity as evidenced by patient attending supervised weight loss for insurance approval of bariatric surgery.    Intervention:  Nutrition counseling provided. Plan: Pharmacist, community. Continue working on chewing well and not drinking during meals.  Plan to walk 4 blocks each day  Teaching Method Utilized:  Visual Auditory Hands on  Barriers to learning/adherence to lifestyle change: none  Demonstrated degree of understanding via:  Teach Back   Monitoring/Evaluation:  Dietary intake, exercise, and body weight. Follow up to attend pre op class.

## 2016-09-04 NOTE — Progress Notes (Signed)
  6 Months Supervised Weight Loss Visit:   Pre-Operative Sleeve Gastrectomy Surgery  Medical Nutrition Therapy:  Appt start time: 0805 end time:  0820.  Primary concerns today: Supervised Weight Loss Visit. Terry Baxter returns today with an 11 lb weight loss since July! Has been cutting back on portions. Has been seeing his PCP for supervised weight loss. States that he needs 2 more appointments and will be seeing his doctor for those appointment  Has been practicing chewing well. Has also been practicing not drinking during meals.   Weight: 273.4 lbs BMI: 40.37 -Follow diet recommendations listed below   Energy and Macronutrient Recomendations: Calories: 1800 Carbohydrate: 200 Protein: 135 Fat: 50  Preferred Learning Style:   No preference indicated   Learning Readiness:   Ready  24-hr recall: B (AM): Ensure  Snk (AM): none L (PM): peanut butter sandwich on rye bread Snk (PM): none D (PM): Lean Cuisine Snk (PM): none  Beverages: water 2 liters per day, coffee (seldom)  Medications: see list   Recent physical activity:  Walks 3 blocks per day  Progress Towards Goal(s):  In progress.  Handouts given during visit include:  Pre Op Goals   Protein Shakes   Nutritional Diagnosis:  [place]-3.3 Obesity related to past poor dietary habits and physical inactivity as evidenced by patient attending supervised weight loss for insurance approval of bariatric surgery.    Intervention:  Nutrition counseling provided. Plan: Pharmacist, community. Continue working on chewing well and not drinking during meals.  Work on walking one more block per day.   Teaching Method Utilized:  Visual Auditory Hands on  Barriers to learning/adherence to lifestyle change: none  Demonstrated degree of understanding via:  Teach Back   Monitoring/Evaluation:  Dietary intake, exercise, and body weight. Follow up to attend pre op class.

## 2016-09-04 NOTE — Progress Notes (Signed)
  6 Months Supervised Weight Loss Visit:   Pre-Operative Sleeve Gastrectomy Surgery  Medical Nutrition Therapy:  Appt start time: 1010 end time:  1025  Primary concerns today: Supervised Weight Loss Visit. Terry Baxter returns today a 1 lb weight loss since month. Has started doing some chair exercises and some walking and stairs at home.   Had a fall last month but did not have any injuries.  Has not tried Premier protein yet.   Feels like chewing is going well (chewing 20 times per bite). Has also been practicing not drinking during meals.   Weight: 272.2 lbs BMI: 40.2  -Follow diet recommendations listed below   Energy and Macronutrient Recomendations: Calories: 1800 Carbohydrate: 200 Protein: 135 Fat: 50 Preferred Learning Style:   No preference indicated   Learning Readiness:   Ready  24-hr recall: B (AM): Ensure  Snk (AM): none L (PM): peanut butter sandwich on rye bread Snk (PM): none D (PM): Lean Cuisine Snk (PM): none  Beverages: water 2 liters per day, coffee (seldom)  Medications: see list   Recent physical activity:  Walks in hall at home 3 x day and does chair exercises  Progress Towards Goal(s):  In progress.  Handouts given during visit include:  None  Supplements given during visit include:  [employer], lot# [id number], exp 05/03/2017   Nutritional Diagnosis:  [place]-3.3 Obesity related to past poor dietary habits and physical inactivity as evidenced by patient attending supervised weight loss for insurance approval of bariatric surgery.    Intervention:  Nutrition counseling provided. Plan: Pharmacist, community. Continue working on chewing well and not drinking during meals.  Continue exercising each day.  Teaching Method Utilized:  Visual Auditory Hands on  Barriers to learning/adherence to lifestyle change: none  Demonstrated degree of understanding via:  Teach Back   Monitoring/Evaluation:  Dietary intake, exercise, and body  weight. Follow up for SWL or  to attend pre op class.

## 2016-09-05 ENCOUNTER — Encounter: Payer: Medicare Other | Attending: General Surgery | Admitting: Skilled Nursing Facility1

## 2016-09-05 ENCOUNTER — Encounter: Payer: Self-pay | Admitting: Skilled Nursing Facility1

## 2016-09-05 DIAGNOSIS — E669 Obesity, unspecified: Secondary | ICD-10-CM | POA: Diagnosis not present

## 2016-09-05 DIAGNOSIS — Z713 Dietary counseling and surveillance: Secondary | ICD-10-CM | POA: Insufficient documentation

## 2016-09-05 DIAGNOSIS — Z6841 Body Mass Index (BMI) 40.0 and over, adult: Secondary | ICD-10-CM | POA: Insufficient documentation

## 2016-09-05 DIAGNOSIS — F329 Major depressive disorder, single episode, unspecified: Secondary | ICD-10-CM | POA: Diagnosis not present

## 2016-09-05 DIAGNOSIS — M545 Low back pain: Secondary | ICD-10-CM | POA: Insufficient documentation

## 2016-09-05 DIAGNOSIS — E119 Type 2 diabetes mellitus without complications: Secondary | ICD-10-CM | POA: Insufficient documentation

## 2016-09-05 DIAGNOSIS — M1990 Unspecified osteoarthritis, unspecified site: Secondary | ICD-10-CM | POA: Diagnosis not present

## 2016-09-05 NOTE — Progress Notes (Signed)
  6 Months Supervised Weight Loss Visit:   Pre-Operative Sleeve Gastrectomy Surgery  Medical Nutrition Therapy:  Appt start time: 1010 end time:  1025  Primary concerns today: Supervised Weight Loss Visit. Terry Baxter returns today having lost 2 pounds.   Pt states he tried premier protein which he likes okay.   Pt states he got cataract surgery this past Tuesday. Pt states his blood sugars are below 100 fasting. Thinks maybe one more appointment next month.   Physical activity: Physical therapy exercises and feels better after he does it.   Weight: 269.7 pounds BMI: 39.83 Feels like chewing is going well (chewing 20 times per bite). Has also been practicing not drinking during meals.  Preferred Learning Style:   No preference indicated   Learning Readiness:   Ready  24-hr recall: B (AM): premeir shake  Snk (AM): banana or boiled egg  L (PM): lean cuisine Snk (PM): none D (PM): Lean Cuisine Snk (PM): none  Beverages: water 2 16 ounces  Medications: see list   Recent physical activity:  Walks in hall at home 3 x day and does chair exercises -Follow diet recommendations listed below   Energy and Macronutrient Recomendations: Calories: 1800 Carbohydrate: 200 Protein: 135 Fat: 50  Progress Towards Goal(s):  In progress.  Handouts given during visit include:  None    Nutritional Diagnosis:  [place]-3.3 Obesity related to past poor dietary habits and physical inactivity as evidenced by patient attending supervised weight loss for insurance approval of bariatric surgery.    Intervention:  Nutrition counseling provided. Plan: Continue working on chewing well and not drinking during meals.  Continue exercising each day. -Drink 4 16 ounce bottle of water  -Keep up the great work!!  Teaching Method Utilized:  Visual Auditory Hands on  Barriers to learning/adherence to lifestyle change: none  Demonstrated degree of understanding via:  Teach Back    Monitoring/Evaluation:  Dietary intake, exercise, and body weight. Follow up for SWL or  to attend pre op class.

## 2016-09-05 NOTE — Patient Instructions (Addendum)
-  Drink 4 16 ounce bottle of water  -Keep up the great work!!

## 2016-09-25 HISTORY — PX: EYE SURGERY: SHX253

## 2016-10-03 ENCOUNTER — Encounter: Payer: Medicare Other | Attending: General Surgery | Admitting: Registered"

## 2016-10-03 DIAGNOSIS — E119 Type 2 diabetes mellitus without complications: Secondary | ICD-10-CM | POA: Diagnosis not present

## 2016-10-03 DIAGNOSIS — F329 Major depressive disorder, single episode, unspecified: Secondary | ICD-10-CM | POA: Diagnosis not present

## 2016-10-03 DIAGNOSIS — M545 Low back pain: Secondary | ICD-10-CM | POA: Diagnosis not present

## 2016-10-03 DIAGNOSIS — Z6841 Body Mass Index (BMI) 40.0 and over, adult: Secondary | ICD-10-CM | POA: Insufficient documentation

## 2016-10-03 DIAGNOSIS — Z713 Dietary counseling and surveillance: Secondary | ICD-10-CM | POA: Insufficient documentation

## 2016-10-03 DIAGNOSIS — M1990 Unspecified osteoarthritis, unspecified site: Secondary | ICD-10-CM | POA: Diagnosis not present

## 2016-10-03 DIAGNOSIS — E669 Obesity, unspecified: Secondary | ICD-10-CM | POA: Insufficient documentation

## 2016-10-03 NOTE — Progress Notes (Signed)
  6 Months Supervised Weight Loss Visit:   Pre-Operative Sleeve Gastrectomy Surgery  Medical Nutrition Therapy:  Appt start time: 0928 end time:  0940  Primary concerns today: Supervised Weight Loss Visit. Terry Baxter returns today having lost 1.6 pounds. Patient reports this is his last required SWL visit.  Pt states he tried premier protein which he likes okay.   Pt states his blood sugars are still below 100 fasting.   Physical activity:   Weight: 268.1 pounds BMI: 39.59  Feels like chewing is going well (chewing 20 times per bite).   Preferred Learning Style:   No preference indicated   Learning Readiness:   Ready  24-hr recall: B (AM): boiled egg, protein drink  Snk (AM): boiled egg  L (PM): none or boiled egg Snk (PM): sweet potato D (PM): Lean Cuisine Snk (PM): none  Beverages: water 2 16 ounces  Medications: see list   Recent physical activity:  Fix own breakfast, fold clothes, walks up and down hallway, stretches & chair exercises.  -Follow diet recommendations listed below   Energy and Macronutrient Recomendations: Calories: 1800 Carbohydrate: 200 Protein: 135 Fat: 50  Progress Towards Goal(s):  In progress.  Handouts given during visit include:  None    Nutritional Diagnosis:  [place]-3.3 Obesity related to past poor dietary habits and physical inactivity as evidenced by patient attending supervised weight loss for insurance approval of bariatric surgery.    Intervention:  Nutrition counseling provided. Plan: Continue working on chewing well and not drinking during meals.  Continue exercising each day. Increase water intake, aim for 4 16 ounce bottle of water Try to find more flavors of protein drinks  -Keep up the great work!!  Teaching Method Utilized:  Visual Auditory Hands on  Barriers to learning/adherence to lifestyle change: none  Demonstrated degree of understanding via:  Teach Back   Monitoring/Evaluation:  Dietary intake, exercise,  and body weight. Follow up for SWL or  to attend pre op class.

## 2016-10-27 ENCOUNTER — Other Ambulatory Visit: Payer: Self-pay | Admitting: Pain Medicine

## 2016-10-27 DIAGNOSIS — M545 Low back pain: Secondary | ICD-10-CM

## 2016-10-30 ENCOUNTER — Ambulatory Visit (INDEPENDENT_AMBULATORY_CARE_PROVIDER_SITE_OTHER): Payer: Medicare Other | Admitting: Psychiatry

## 2016-10-30 DIAGNOSIS — F509 Eating disorder, unspecified: Secondary | ICD-10-CM | POA: Diagnosis not present

## 2016-11-08 ENCOUNTER — Ambulatory Visit
Admission: RE | Admit: 2016-11-08 | Discharge: 2016-11-08 | Disposition: A | Discharge status: Home / Self Care | Payer: Medicare Other | Source: Ambulatory Visit | Attending: Pain Medicine | Admitting: Pain Medicine

## 2016-11-08 DIAGNOSIS — M545 Low back pain: Secondary | ICD-10-CM

## 2016-11-28 ENCOUNTER — Ambulatory Visit (HOSPITAL_COMMUNITY)
Admission: RE | Admit: 2016-11-28 | Discharge: 2016-11-28 | Disposition: A | Discharge status: Home / Self Care | Payer: Medicare Other | Source: Ambulatory Visit | Attending: Cardiology | Admitting: Cardiology

## 2016-11-28 DIAGNOSIS — I313 Pericardial effusion (noninflammatory): Secondary | ICD-10-CM | POA: Insufficient documentation

## 2016-11-28 DIAGNOSIS — Z01818 Encounter for other preprocedural examination: Secondary | ICD-10-CM | POA: Diagnosis not present

## 2016-12-09 NOTE — Progress Notes (Signed)
Please place orders in EPIC as patient is being scheduled for a pre-op appointment! Thank you! 

## 2016-12-15 ENCOUNTER — Encounter: Payer: Medicare Other | Attending: General Surgery | Admitting: Skilled Nursing Facility1

## 2016-12-15 ENCOUNTER — Encounter: Payer: Self-pay | Admitting: Skilled Nursing Facility1

## 2016-12-15 DIAGNOSIS — M1990 Unspecified osteoarthritis, unspecified site: Secondary | ICD-10-CM | POA: Diagnosis not present

## 2016-12-15 DIAGNOSIS — E669 Obesity, unspecified: Secondary | ICD-10-CM | POA: Insufficient documentation

## 2016-12-15 DIAGNOSIS — F329 Major depressive disorder, single episode, unspecified: Secondary | ICD-10-CM | POA: Diagnosis not present

## 2016-12-15 DIAGNOSIS — M545 Low back pain: Secondary | ICD-10-CM | POA: Insufficient documentation

## 2016-12-15 DIAGNOSIS — Z713 Dietary counseling and surveillance: Secondary | ICD-10-CM | POA: Diagnosis present

## 2016-12-15 DIAGNOSIS — Z6841 Body Mass Index (BMI) 40.0 and over, adult: Secondary | ICD-10-CM | POA: Insufficient documentation

## 2016-12-15 DIAGNOSIS — E119 Type 2 diabetes mellitus without complications: Secondary | ICD-10-CM

## 2016-12-15 NOTE — Progress Notes (Signed)
  Pre-Operative Nutrition Class:  Appt start time: 7510   End time:  1830.  Patient was seen on 12/15/16 for Pre-Operative Bariatric Surgery Education at [facility].   Surgery date: 01/06/17 Surgery type: Sleeve [facility] weight at [facility]: 289 lbs Weight today: 274.8 lb  [place]AN[place]  BODY COMP RESULTS     BMI (kg/m^2)    Fat Mass (lbs)    Fat Free Mass (lbs)    Total Body Water (lbs)    Samples given per MNT protocol. Patient educated on appropriate usage: Bariatric Advantage Multivitamin Lot # [id number] Exp: 06/19  Bariatric Advantage Calcium Citrate Lot # [id number] Exp: 04/05/17  Premier Protein Lot # [id number] Exp: 05/30/17  The following the learning objectives were met by the patient during this course:  Identify Pre-Op Dietary Goals and will begin 2 weeks pre-operatively  Identify appropriate sources of fluids and proteins   State protein recommendations and appropriate sources pre and post-operatively  Identify Post-Operative Dietary Goals and will follow for 2 weeks post-operatively  Identify appropriate multivitamin and calcium sources  Describe the need for physical activity post-operatively and will follow MD recommendations  State when to call healthcare provider regarding medication questions or post-operative complications  Handouts given during class include:  Pre-Op Bariatric Surgery Diet Handout  Protein Shake Handout  Post-Op Bariatric Surgery Nutrition Handout  [facility] Program Information Flyer  Support Group Information Flyer  [facility] Bariatric Supplements Price List  Follow-Up Plan: Patient will follow-up at [facility] 2 weeks post operatively for diet advancement per MD.

## 2016-12-31 ENCOUNTER — Ambulatory Visit: Payer: Self-pay | Admitting: General Surgery

## 2017-01-01 ENCOUNTER — Ambulatory Visit: Payer: Self-pay | Admitting: General Surgery

## 2017-01-01 NOTE — Progress Notes (Signed)
CHEST XRAY 06-23-16 EPIC  EKG 06-23-16 EPIC

## 2017-01-01 NOTE — Patient Instructions (Addendum)
Rontae Inglett  01/01/2017   Your procedure is scheduled on: 01-06-17  Report to [facility] Main  Entrance Take [facility]  elevators to 3rd floor to  [place] at 515  AM.    Call this number if you have problems the morning of surgery [phone]    Remember: ONLY 1 PERSON MAY GO WITH YOU TO SHORT STAY TO GET  READY MORNING OF [place].  Do not eat food or drink liquids :After Midnight.     Take these medicines the morning of surgery with A SIP OF WATER: BUPROPION (WELLBUTRIN), GABAPENTIN (NEURONTIN), HYDROCODONE IF NEEDED, LEVETIRACETAM (KEPPRA), EYE DROP, RISPERODONE (RISPERODOL)                               You may not have any metal on your body including hair pins and              piercings  Do not wear jewelry, make-up, lotions, powders or perfumes, deodorant             Do not wear nail polish.  Do not shave  48 hours prior to surgery.              Men may shave face and neck.   Do not bring valuables to the hospital. [place].  Contacts, dentures or bridgework may not be worn into surgery.  Leave suitcase in the car. After surgery it may be brought to your room.                  Please read over the following fact sheets you were given: _____________________________________________________________________             [facility] - Preparing for Surgery Before surgery, you can play an important role.  Because skin is not sterile, your skin needs to be as free of germs as possible.  You can reduce the number of germs on your skin by washing with CHG (chlorahexidine gluconate) soap before surgery.  CHG is an antiseptic cleaner which kills germs and bonds with the skin to continue killing germs even after washing. Please DO NOT use if you have an allergy to CHG or antibacterial soaps.  If your skin becomes reddened/irritated stop using the CHG and inform your nurse when you arrive at Short  Stay. Do not shave (including legs and underarms) for at least 48 hours prior to the first CHG shower.  You may shave your face/neck. Please follow these instructions carefully:  1.  Shower with CHG Soap the night before surgery and the  morning of Surgery.  2.  If you choose to wash your hair, wash your hair first as usual with your  normal  shampoo.  3.  After you shampoo, rinse your hair and body thoroughly to remove the  shampoo.                           4.  Use CHG as you would any other liquid soap.  You can apply chg directly  to the skin and wash  Gently with a scrungie or clean washcloth.  5.  Apply the CHG Soap to your body ONLY FROM THE NECK DOWN.   Do not use on face/ open                           Wound or open sores. Avoid contact with eyes, ears mouth and genitals (private parts).                       Wash face,  Genitals (private parts) with your normal soap.             6.  Wash thoroughly, paying special attention to the area where your surgery  will be performed.  7.  Thoroughly rinse your body with warm water from the neck down.  8.  DO NOT shower/wash with your normal soap after using and rinsing off  the CHG Soap.                9.  Pat yourself dry with a clean towel.            10.  Wear clean pajamas.            11.  Place clean sheets on your bed the night of your first shower and do not  sleep with pets. Day of Surgery : Do not apply any lotions/deodorants the morning of surgery.  Please wear clean clothes to the hospital/surgery center.  FAILURE TO FOLLOW THESE INSTRUCTIONS MAY RESULT IN THE CANCELLATION OF YOUR SURGERY PATIENT SIGNATURE_________________________________  NURSE SIGNATURE__________________________________  ________________________________________________________________________   Adam Phenix  An incentive spirometer is a tool that can help keep your lungs clear and active. This tool measures how well you are  filling your lungs with each breath. Taking long deep breaths may help reverse or decrease the chance of developing breathing (pulmonary) problems (especially infection) following:  A long period of time when you are unable to move or be active. BEFORE THE PROCEDURE   If the spirometer includes an indicator to show your best effort, your nurse or respiratory therapist will set it to a desired goal.  If possible, sit up straight or lean slightly forward. Try not to slouch.  Hold the incentive spirometer in an upright position. INSTRUCTIONS FOR USE  1. Sit on the edge of your bed if possible, or sit up as far as you can in bed or on a chair. 2. Hold the incentive spirometer in an upright position. 3. Breathe out normally. 4. Place the mouthpiece in your mouth and seal your lips tightly around it. 5. Breathe in slowly and as deeply as possible, raising the piston or the ball toward the top of the column. 6. Hold your breath for 3-5 seconds or for as long as possible. Allow the piston or ball to fall to the bottom of the column. 7. Remove the mouthpiece from your mouth and breathe out normally. 8. Rest for a few seconds and repeat Steps 1 through 7 at least 10 times every 1-2 hours when you are awake. Take your time and take a few normal breaths between deep breaths. 9. The spirometer may include an indicator to show your best effort. Use the indicator as a goal to work toward during each repetition. 10. After each set of 10 deep breaths, practice coughing to be sure your lungs are clear. If you have an incision (the cut made at the time of surgery),  support your incision when coughing by placing a pillow or rolled up towels firmly against it. Once you are able to get out of bed, walk around indoors and cough well. You may stop using the incentive spirometer when instructed by your caregiver.  RISKS AND COMPLICATIONS  Take your time so you do not get dizzy or light-headed.  If you are in pain,  you may need to take or ask for pain medication before doing incentive spirometry. It is harder to take a deep breath if you are having pain. AFTER USE  Rest and breathe slowly and easily.  It can be helpful to keep track of a log of your progress. Your caregiver can provide you with a simple table to help with this. If you are using the spirometer at home, follow these instructions: [place] IF:   You are having difficultly using the spirometer.  You have trouble using the spirometer as often as instructed.  Your pain medication is not giving enough relief while using the spirometer.  You develop fever of 100.5 F (38.1 C) or higher. SEEK IMMEDIATE MEDICAL CARE IF:   You cough up bloody sputum that had not been present before.  You develop fever of 102 F (38.9 C) or greater.  You develop worsening pain at or near the incision site. MAKE SURE YOU:   Understand these instructions.  Will watch your condition.  Will get help right away if you are not doing well or get worse. Document Released: 11/24/2006 Document Revised: 10/06/2011 Document Reviewed: 01/25/2007 ExitCare Patient Information 2014 ExitCare, [place] TO PREVENT PNEUMONIA! ________________________________________________________________________

## 2017-01-01 NOTE — H&P (Signed)
Terry Baxter 12/31/2016 12:00 PM Location: [facility] Patient #: [id number] DOB: 06/01/1946 Widowed / Language: [name] / Race: Black or African American Male  History of Present Illness Terry Baxter M. Sweet Jarvis MD; 01/01/2017 4:34 PM) The patient is a 71 year old male who presents for a bariatric surgery evaluation. He comes in today for his preoperative appointment. He denies any medical issues or events since I last saw him about 2 months ago. He denies any trips the emergency room hospital. He denies any chest pain or chest pressure. He reports that he is been getting around well. He has been on his preoperative meal plan and has been drinking his protein shakes. I spoke with his brother since his last visit to serves as his power of attorney to discuss weight loss surgery to make sure he was aware what was going on and he voiced understanding and support for his brother to undergo weight loss surgery.  Review of systems-conference a 12 point review systems was performed and all systems are negative except for what is mentioned in HPI   10/29/16 He comes in today to rediscuss weight loss surgery. I initially met him about last June. He has completed his supervised weight loss requirement. He has met with psychology as well. He has demonstrated compliance with this supervised weight loss visits. He is still interested in proceeding with weight loss surgery. He had 2 trips to the emergency room since I last saw him for falls. And he had a 1 night stay for urinary tract infection. His social situation has not changed. A friend down the hall takes him to the grocery store several times a month. Other than that he uses public transportation. He has ongoing regular follow-up with Dr. Robina Ade at [facility]. He denies chest pain, chest pressure, short of breath, paroxysmal nocturnal dyspnea, TIAs or amaurosis fugax. He states that he will get short of breath after walking 1  block. He denies any regular nausea or vomiting. He denies any abdominal pain. He denies any indigestion heartburn better sour taste. He states that he normally eats a meal in about 15-20 minutes. He denies any melena or hematochezia. He reports daily bowel movements.  Review of systems-comprehensive a 12 point review systems was performed and all systems are negative except for what is mentioned in HPI  01/10/2016 He is referred by Dr Wallene Huh for evaluation of weight loss surgery. He has seen Dr. Terrence Dupont in the past for cardiology. Dr Rachael Fee manages his chronic pain. His psychological health is managed at [facility]). He is interested in the sleeve gastrectomy. He attended our seminar in person. His mother and sister have had a sleep gastrectomy and has done well. He is interested in improving his health. He wants to be able to walk a mile again without difficulty. He wants to decrease the number medications he takes. He states that he has struggled with his weight many years but more specifically the last 5 years. He has tried self diets, regular physical activity, healthy meals, as well as physician supervised weight loss-all without any long-term success.  His comorbidities include hypertension, dyslipidemia, history of hepatitis C, diabetes mellitus type 2, bipolar disease, seizure disorder, TIA  He denies any chest pain, chest pressure, shortness of breath, orthopnea or paroxysmal nocturnal dyspnea. He does get winded with physical activity. He had an apparent TIA in April 2016. He was admitted for workup and evaluation. There is no signs of acute stroke  on CT or MRI. His carotid ultrasound was within normal limits with no significant plaque burden. He had an echocardiogram at that time which showed an ejection fraction of 45-50% with diffuse hypokinesis. He denies any current symptoms of TIAs or amaurosis fugax. He states that he had a  study 15 years ago and it was negative. He denies any heartburn, reflux or indigestion. He denies any abdominal pain. He has a bowel movement every other day. He reports a normal colonoscopy. He states that he was treated for his hepatitis C. He denies any prior abdominal surgery. He denies any dysuria or hematuria. He has low back pain for which he takes hydrocodone twice a day. He states that he has diet controlled diabetes mellitus. He has not had a seizure in quite some time.  He denies any tobacco, alcohol, or drug use.  He lives in a group home called [facility]. He has a small refrigerator and microwave in his room. He tends to eat frozen meals. His brother serves as his medical power of attorney. He lives about half a mile away. He believes he could stay at his brother's house after surgery for short time. The patient does not drive.  He states that he takes his bipolar medications. However he has in the past stopped taking his medication however that was more than 5 years ago   Problem List/Past Medical Terry Baxter M. 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 Redmond Pulling, MD; 01/01/2017 4:36 PM) Colon Polyp Removal - Colonoscopy  Diagnostic Studies History Terry Baxter M. Redmond Pulling, MD; 01/01/2017 4:36 PM) Colonoscopy 1-5 years ago  Allergies Dalbert Mayotte, CMA; 12/31/2016 11:50 AM) No Known Drug Allergies 01/10/2016 Allergies Reconciled  Medication History Terry Baxter M. Redmond Pulling, MD; 01/01/2017 4:36 PM) BuPROPion HCl ER (XL) (300MG Tablet ER 24HR, Oral) Active. Tamsulosin HCl (0.4MG Capsule, Oral) Active. Gabapentin (400MG Capsule, Oral two times  daily) Active. HydroCHLOROthiazide (12.5MG Capsule, Oral) Active. Keppra (500MG Tablet, Oral two times daily) Active. ClonazePAM (1MG Tablet, Oral 4 times a day) Active. RisperiDONE (1MG Tablet, Oral two times daily) Active. Doxepin HCl (10MG Capsule, Oral two times nightly) Active. Docusate Sodium (100MG Capsule, Oral) Active. Atorvastatin Calcium (40MG Tablet, Oral) Active. Hydrocodone-Acetaminophen (10-325MG Tablet, Oral four times daily) Active. Medications Reconciled Ondansetron HCl (4MG Tablet, 1 (one) Tablet Oral every eight hours, as needed, Taken starting 12/31/2016) Active. Pantoprazole Sodium (40MG Tablet DR, 1 (one) Tablet Oral daily, Taken starting 12/31/2016) Active.  Social History Terry Baxter M. Redmond Pulling, MD; 01/01/2017 4:36 PM) No alcohol use No caffeine use No drug use Tobacco use Never smoker.  Family History Terry Baxter M. Redmond Pulling, MD; 01/01/2017 4:36 PM) Alcohol Abuse Daughter. Diabetes Mellitus Brother, Father, Sister. Family history unknown First Degree Relatives  Other Problems Terry Baxter M. Redmond Pulling, MD; 01/01/2017 4:36 PM) Arthritis Back Pain Bladder Problems Depression Diabetes Mellitus    Vitals Dalbert Mayotte CMA; 12/31/2016 11:53 AM) 12/31/2016 11:52 AM Weight: 2735 lb Height: 68in Body Surface Area: 6.21 m Body Mass Index: 415.85 kg/m  Temp.: 97.74F  Pulse: 92 (Regular)  BP: 120/82 (Sitting, Left Arm, Standard)      Physical Exam Terry Baxter M. Diamantina Edinger MD; 01/01/2017 4:32 PM)  General Mental Status-Alert. General Appearance-Consistent with stated age. Hydration-Well hydrated. Voice-Normal. Note: Morbidly obese, central  truncal obesity  Integumentary Note: Trace ankle edema  Head and Neck Head-normocephalic, atraumatic with no lesions or palpable masses. Trachea-midline. Thyroid Gland Characteristics - normal size and consistency.  Eye Eyeball - Bilateral-Extraocular movements intact. Sclera/Conjunctiva -  Bilateral-No scleral icterus.  ENMT Note: normal external ears. normal dentition  Chest and Lung Exam Chest and lung exam reveals -quiet, even and easy respiratory effort with no use of accessory muscles and on auscultation, normal breath sounds, no adventitious sounds and normal vocal resonance. Inspection Chest Wall - Normal. Back - normal.  Breast - Did not examine.  Cardiovascular Cardiovascular examination reveals -normal heart sounds, regular rate and rhythm with no murmurs and normal pedal pulses bilaterally.  Abdomen Inspection Inspection of the abdomen reveals - No Hernias. Skin - Scar - no surgical scars. Palpation/Percussion Palpation and Percussion of the abdomen reveal - Soft, Non Tender, No Rebound tenderness, No Rigidity (guarding) and No hepatosplenomegaly. Auscultation Auscultation of the abdomen reveals - Bowel sounds normal.  Peripheral Vascular Upper Extremity Palpation - Pulses bilaterally normal.  Neurologic Neurologic evaluation reveals -alert and oriented x 3 with no impairment of recent or remote memory. Mental Status-Normal.  Neuropsychiatric The patient's mood and affect are described as -not angry, not anxious, not agitated. Note:flat Associations-intact. Judgment and Insight-insight is appropriate concerning matters relevant to self. Speech-slow.  Musculoskeletal Normal Exam - Left-Upper Extremity Strength Normal and Lower Extremity Strength Normal. Normal Exam - Right-Upper Extremity Strength Normal and Lower Extremity Strength Normal.  Lymphatic Head & Neck  General Head & Neck Lymphatics: Bilateral - Description - Normal. Axillary - Did not examine. Femoral & Inguinal - Did not examine.    Assessment & Plan Terry Baxter M. Shelley Pooley MD; 01/01/2017 4:36 PM)  MORBID OBESITY WITH BMI OF 40.0-44.9, ADULT (E66.01) Impression: We reviewed his preoperative workup again. I answered all of his questions regarding the hospital and  postoperative course that he had today. I encouraged him to continue to work on his preoperative diet. He was given his postoperative prescriptions today.  Current Plans Pt Education - EMW_preopbariatric Started Ondansetron HCl 4MG, 1 (one) Tablet every eight hours, as needed, #20, 12/31/2016, No Refill. Started Pantoprazole Sodium 40MG, 1 (one) Tablet daily, #30, 12/31/2016, Ref. x1. 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 He is oriented 3. he filled out his paperwork for intake today appropriately. He personal letter was legible and well written. He has demonstrated compliance with his required appointments. He has been compliant with his behavioral medication. Psychology evaluated him and felt that he was capable of making an informed decision. I did base with his brother who is his power of attorney to discuss surgery as well.  SEIZURE DISORDER (G40.909)  ELEVATED LDL CHOLESTEROL LEVEL (E78.00)  HYPERTENSION, ESSENTIAL (I10)  DIABETES MELLITUS TYPE 2 IN OBESE (E11.69) Story: A1C 6 in 4/17  Leighton Ruff. Redmond Pulling, MD, FACS General, Bariatric, & Minimally Invasive Surgery [facility], [place]'

## 2017-01-02 ENCOUNTER — Encounter (HOSPITAL_COMMUNITY)
Admission: RE | Admit: 2017-01-02 | Discharge: 2017-01-02 | Disposition: A | Discharge status: Home / Self Care | Payer: Medicare Other | Source: Ambulatory Visit | Attending: General Surgery | Admitting: General Surgery

## 2017-01-02 ENCOUNTER — Encounter (HOSPITAL_COMMUNITY): Payer: Self-pay

## 2017-01-02 DIAGNOSIS — Z87440 Personal history of urinary (tract) infections: Secondary | ICD-10-CM | POA: Insufficient documentation

## 2017-01-02 DIAGNOSIS — Z6841 Body Mass Index (BMI) 40.0 and over, adult: Secondary | ICD-10-CM | POA: Diagnosis not present

## 2017-01-02 DIAGNOSIS — E1340 Other specified diabetes mellitus with diabetic neuropathy, unspecified: Secondary | ICD-10-CM | POA: Insufficient documentation

## 2017-01-02 DIAGNOSIS — Z8673 Personal history of transient ischemic attack (TIA), and cerebral infarction without residual deficits: Secondary | ICD-10-CM | POA: Insufficient documentation

## 2017-01-02 DIAGNOSIS — Z01812 Encounter for preprocedural laboratory examination: Secondary | ICD-10-CM | POA: Insufficient documentation

## 2017-01-02 DIAGNOSIS — I10 Essential (primary) hypertension: Secondary | ICD-10-CM | POA: Insufficient documentation

## 2017-01-02 DIAGNOSIS — F319 Bipolar disorder, unspecified: Secondary | ICD-10-CM | POA: Insufficient documentation

## 2017-01-02 DIAGNOSIS — I517 Cardiomegaly: Secondary | ICD-10-CM | POA: Insufficient documentation

## 2017-01-02 DIAGNOSIS — N182 Chronic kidney disease, stage 2 (mild): Secondary | ICD-10-CM | POA: Diagnosis not present

## 2017-01-02 DIAGNOSIS — Z01810 Encounter for preprocedural cardiovascular examination: Secondary | ICD-10-CM | POA: Insufficient documentation

## 2017-01-02 DIAGNOSIS — F1210 Cannabis abuse, uncomplicated: Secondary | ICD-10-CM | POA: Diagnosis not present

## 2017-01-02 DIAGNOSIS — E1159 Type 2 diabetes mellitus with other circulatory complications: Secondary | ICD-10-CM | POA: Insufficient documentation

## 2017-01-02 DIAGNOSIS — E785 Hyperlipidemia, unspecified: Secondary | ICD-10-CM | POA: Insufficient documentation

## 2017-01-02 DIAGNOSIS — Z87898 Personal history of other specified conditions: Secondary | ICD-10-CM | POA: Insufficient documentation

## 2017-01-02 HISTORY — DX: Unspecified osteoarthritis, unspecified site: M19.90

## 2017-01-02 HISTORY — DX: Anxiety disorder, unspecified: F41.9

## 2017-01-02 LAB — CBC WITH DIFFERENTIAL/PLATELET
Basophils Absolute: 0 10*3/uL (ref 0.0–0.1)
Basophils Relative: 0 %
Eosinophils Absolute: 0.1 10*3/uL (ref 0.0–0.7)
Eosinophils Relative: 2 %
HEMATOCRIT: 38.9 % — AB (ref 39.0–52.0)
HEMOGLOBIN: 13.3 g/dL (ref 13.0–17.0)
LYMPHS ABS: 1.5 10*3/uL (ref 0.7–4.0)
Lymphocytes Relative: 19 %
MCH: 31.2 pg (ref 26.0–34.0)
MCHC: 34.2 g/dL (ref 30.0–36.0)
MCV: 91.3 fL (ref 78.0–100.0)
Monocytes Absolute: 1.1 10*3/uL — ABNORMAL HIGH (ref 0.1–1.0)
Monocytes Relative: 14 %
NEUTROS PCT: 65 %
Neutro Abs: 5.3 10*3/uL (ref 1.7–7.7)
Platelets: 158 10*3/uL (ref 150–400)
RBC: 4.26 MIL/uL (ref 4.22–5.81)
RDW: 13.4 % (ref 11.5–15.5)
WBC: 8 10*3/uL (ref 4.0–10.5)

## 2017-01-02 LAB — COMPREHENSIVE METABOLIC PANEL
ALK PHOS: 78 U/L (ref 38–126)
ALT: 13 U/L — AB (ref 17–63)
AST: 21 U/L (ref 15–41)
Albumin: 4.1 g/dL (ref 3.5–5.0)
Anion gap: 10 (ref 5–15)
BILIRUBIN TOTAL: 0.7 mg/dL (ref 0.3–1.2)
BUN: 23 mg/dL — ABNORMAL HIGH (ref 6–20)
CALCIUM: 9.3 mg/dL (ref 8.9–10.3)
CO2: 24 mmol/L (ref 22–32)
CREATININE: 1.11 mg/dL (ref 0.61–1.24)
Chloride: 106 mmol/L (ref 101–111)
Glucose, Bld: 95 mg/dL (ref 65–99)
Potassium: 3.7 mmol/L (ref 3.5–5.1)
Sodium: 140 mmol/L (ref 135–145)
Total Protein: 8.3 g/dL — ABNORMAL HIGH (ref 6.5–8.1)

## 2017-01-02 LAB — BASIC METABOLIC PANEL
BUN: 23 — AB (ref 4–21)
Creatinine: 1.1 (ref 0.6–1.3)
Glucose: 95
Potassium: 3.7 (ref 3.4–5.3)
Sodium: 140 (ref 137–147)

## 2017-01-02 LAB — HEPATIC FUNCTION PANEL
ALK PHOS: 78 (ref 25–125)
ALT: 13 (ref 10–40)
AST: 21 (ref 14–40)
Bilirubin, Total: 0.7

## 2017-01-02 LAB — CBC AND DIFFERENTIAL
HCT: 39 — AB (ref 41–53)
HEMOGLOBIN: 13.3 — AB (ref 13.5–17.5)
PLATELETS: 158 (ref 150–399)
WBC: 8

## 2017-01-02 LAB — GLUCOSE, CAPILLARY: Glucose-Capillary: 90 mg/dL (ref 65–99)

## 2017-01-02 NOTE — Progress Notes (Signed)
Spoke with dr hatchett anesthesia and made aware 06-23-16 ekg results, order received to repeat ekg at pre op today

## 2017-01-03 LAB — HEMOGLOBIN A1C
HEMOGLOBIN A1C: 5.4 % (ref 4.8–5.6)
MEAN PLASMA GLUCOSE: 108 mg/dL

## 2017-01-06 ENCOUNTER — Inpatient Hospital Stay (HOSPITAL_COMMUNITY): Payer: Medicare Other | Admitting: Anesthesiology

## 2017-01-06 ENCOUNTER — Encounter (HOSPITAL_COMMUNITY)
Admission: RE | Disposition: A | Discharge status: Skilled Nursing Facility | Payer: Self-pay | Source: Ambulatory Visit | Attending: General Surgery

## 2017-01-06 ENCOUNTER — Inpatient Hospital Stay (HOSPITAL_COMMUNITY)
Admission: RE | Admit: 2017-01-06 | Discharge: 2017-01-09 | DRG: 620 | Disposition: A | Discharge status: Skilled Nursing Facility | Payer: Medicare Other | Source: Ambulatory Visit | Attending: General Surgery | Admitting: General Surgery

## 2017-01-06 ENCOUNTER — Encounter (HOSPITAL_COMMUNITY): Payer: Self-pay | Admitting: *Deleted

## 2017-01-06 DIAGNOSIS — I10 Essential (primary) hypertension: Secondary | ICD-10-CM | POA: Diagnosis present

## 2017-01-06 DIAGNOSIS — F319 Bipolar disorder, unspecified: Secondary | ICD-10-CM | POA: Diagnosis present

## 2017-01-06 DIAGNOSIS — E785 Hyperlipidemia, unspecified: Secondary | ICD-10-CM | POA: Diagnosis present

## 2017-01-06 DIAGNOSIS — E1122 Type 2 diabetes mellitus with diabetic chronic kidney disease: Secondary | ICD-10-CM | POA: Diagnosis present

## 2017-01-06 DIAGNOSIS — I129 Hypertensive chronic kidney disease with stage 1 through stage 4 chronic kidney disease, or unspecified chronic kidney disease: Secondary | ICD-10-CM | POA: Diagnosis present

## 2017-01-06 DIAGNOSIS — F419 Anxiety disorder, unspecified: Secondary | ICD-10-CM | POA: Diagnosis present

## 2017-01-06 DIAGNOSIS — Z9884 Bariatric surgery status: Secondary | ICD-10-CM

## 2017-01-06 DIAGNOSIS — N182 Chronic kidney disease, stage 2 (mild): Secondary | ICD-10-CM | POA: Diagnosis present

## 2017-01-06 DIAGNOSIS — Z79891 Long term (current) use of opiate analgesic: Secondary | ICD-10-CM

## 2017-01-06 DIAGNOSIS — Z79899 Other long term (current) drug therapy: Secondary | ICD-10-CM | POA: Diagnosis not present

## 2017-01-06 DIAGNOSIS — R32 Unspecified urinary incontinence: Secondary | ICD-10-CM | POA: Diagnosis present

## 2017-01-06 DIAGNOSIS — Z888 Allergy status to other drugs, medicaments and biological substances status: Secondary | ICD-10-CM

## 2017-01-06 DIAGNOSIS — Z6841 Body Mass Index (BMI) 40.0 and over, adult: Secondary | ICD-10-CM

## 2017-01-06 DIAGNOSIS — D72829 Elevated white blood cell count, unspecified: Secondary | ICD-10-CM

## 2017-01-06 DIAGNOSIS — F3160 Bipolar disorder, current episode mixed, unspecified: Secondary | ICD-10-CM | POA: Diagnosis present

## 2017-01-06 DIAGNOSIS — G8929 Other chronic pain: Secondary | ICD-10-CM | POA: Diagnosis present

## 2017-01-06 DIAGNOSIS — K449 Diaphragmatic hernia without obstruction or gangrene: Secondary | ICD-10-CM | POA: Diagnosis present

## 2017-01-06 DIAGNOSIS — E1159 Type 2 diabetes mellitus with other circulatory complications: Secondary | ICD-10-CM | POA: Diagnosis present

## 2017-01-06 DIAGNOSIS — G40909 Epilepsy, unspecified, not intractable, without status epilepticus: Secondary | ICD-10-CM

## 2017-01-06 DIAGNOSIS — Z8673 Personal history of transient ischemic attack (TIA), and cerebral infarction without residual deficits: Secondary | ICD-10-CM | POA: Diagnosis not present

## 2017-01-06 DIAGNOSIS — F329 Major depressive disorder, single episode, unspecified: Secondary | ICD-10-CM | POA: Diagnosis present

## 2017-01-06 DIAGNOSIS — Z8619 Personal history of other infectious and parasitic diseases: Secondary | ICD-10-CM | POA: Diagnosis not present

## 2017-01-06 HISTORY — PX: LAPAROSCOPIC GASTRIC SLEEVE RESECTION: SHX5895

## 2017-01-06 LAB — GLUCOSE, CAPILLARY
GLUCOSE-CAPILLARY: 145 mg/dL — AB (ref 65–99)
GLUCOSE-CAPILLARY: 138 mg/dL — AB (ref 65–99)
Glucose-Capillary: 95 mg/dL (ref 65–99)
Glucose-Capillary: 151 mg/dL — ABNORMAL HIGH (ref 65–99)
Glucose-Capillary: 135 mg/dL — ABNORMAL HIGH (ref 65–99)
Glucose-Capillary: 168 mg/dL — ABNORMAL HIGH (ref 65–99)

## 2017-01-06 LAB — CBC AND DIFFERENTIAL
HCT: 39 — AB (ref 41–53)
Hemoglobin: 13.5 (ref 13.5–17.5)

## 2017-01-06 LAB — HEMOGLOBIN AND HEMATOCRIT, BLOOD
HCT: 39.1 % (ref 39.0–52.0)
HEMOGLOBIN: 13.5 g/dL (ref 13.0–17.0)

## 2017-01-06 SURGERY — GASTRECTOMY, SLEEVE, LAPAROSCOPIC
Anesthesia: General | Site: Abdomen

## 2017-01-06 MED ORDER — KCL IN DEXTROSE-NACL 20-5-0.45 MEQ/L-%-% IV SOLN
INTRAVENOUS | Status: DC
  Administered 2017-01-06 – 2017-01-08 (×6): via INTRAVENOUS
  Filled 2017-01-06 (×7): qty 1000

## 2017-01-06 MED ORDER — ACETAMINOPHEN 500 MG PO TABS
1000.0000 mg | ORAL_TABLET | ORAL | Status: AC
  Administered 2017-01-06: 1000 mg via ORAL
  Filled 2017-01-06: qty 2

## 2017-01-06 MED ORDER — PROPOFOL 10 MG/ML IV BOLUS
INTRAVENOUS | Status: AC
  Filled 2017-01-06: qty 20

## 2017-01-06 MED ORDER — ONDANSETRON HCL 4 MG/2ML IJ SOLN
INTRAMUSCULAR | Status: AC
  Filled 2017-01-06: qty 2

## 2017-01-06 MED ORDER — CHLORHEXIDINE GLUCONATE 4 % EX LIQD: 60.0000 mL | Freq: Once | CUTANEOUS | Status: DC

## 2017-01-06 MED ORDER — DEXAMETHASONE SODIUM PHOSPHATE 4 MG/ML IJ SOLN
4.0000 mg | INTRAMUSCULAR | Status: AC
  Administered 2017-01-06: 10 mg via INTRAVENOUS

## 2017-01-06 MED ORDER — 0.9 % SODIUM CHLORIDE (POUR BTL) OPTIME
TOPICAL | Status: DC | PRN
  Administered 2017-01-06: 1000 mL

## 2017-01-06 MED ORDER — HEPARIN SODIUM (PORCINE) 5000 UNIT/ML IJ SOLN
5000.0000 [IU] | INTRAMUSCULAR | Status: AC
  Administered 2017-01-06: 5000 [IU] via SUBCUTANEOUS
  Filled 2017-01-06: qty 1

## 2017-01-06 MED ORDER — SCOPOLAMINE 1 MG/3DAYS TD PT72
1.0000 | MEDICATED_PATCH | TRANSDERMAL | Status: DC
  Administered 2017-01-06: 1.5 mg via TRANSDERMAL
  Filled 2017-01-06: qty 1

## 2017-01-06 MED ORDER — GABAPENTIN 300 MG PO CAPS
300.0000 mg | ORAL_CAPSULE | ORAL | Status: AC
  Administered 2017-01-06: 300 mg via ORAL
  Filled 2017-01-06: qty 1

## 2017-01-06 MED ORDER — MIDAZOLAM HCL 2 MG/2ML IJ SOLN
INTRAMUSCULAR | Status: DC | PRN
  Administered 2017-01-06 (×2): 1 mg via INTRAVENOUS

## 2017-01-06 MED ORDER — ENOXAPARIN SODIUM 30 MG/0.3ML ~~LOC~~ SOLN
30.0000 mg | Freq: Two times a day (BID) | SUBCUTANEOUS | Status: DC
  Administered 2017-01-06 – 2017-01-09 (×6): 30 mg via SUBCUTANEOUS
  Filled 2017-01-06 (×6): qty 0.3

## 2017-01-06 MED ORDER — ACETAMINOPHEN 325 MG PO TABS: 650.0000 mg | ORAL_TABLET | ORAL | Status: DC | PRN

## 2017-01-06 MED ORDER — HYDROMORPHONE HCL 1 MG/ML IJ SOLN
0.2500 mg | INTRAMUSCULAR | Status: DC | PRN
  Administered 2017-01-06: 0.5 mg via INTRAVENOUS
  Administered 2017-01-06 (×2): 0.25 mg via INTRAVENOUS

## 2017-01-06 MED ORDER — SODIUM CHLORIDE 0.9 % IJ SOLN
INTRAMUSCULAR | Status: DC | PRN
  Administered 2017-01-06: 50 mL

## 2017-01-06 MED ORDER — BUPIVACAINE LIPOSOME 1.3 % IJ SUSP
INTRAMUSCULAR | Status: DC | PRN
  Administered 2017-01-06: 20 mL

## 2017-01-06 MED ORDER — ROCURONIUM BROMIDE 50 MG/5ML IV SOSY
PREFILLED_SYRINGE | INTRAVENOUS | Status: AC
  Filled 2017-01-06: qty 5

## 2017-01-06 MED ORDER — SIMETHICONE 40 MG/0.6ML PO SUSP
40.0000 mg | Freq: Four times a day (QID) | ORAL | Status: DC | PRN
  Filled 2017-01-06: qty 0.6

## 2017-01-06 MED ORDER — SUGAMMADEX SODIUM 200 MG/2ML IV SOLN
INTRAVENOUS | Status: AC
  Filled 2017-01-06: qty 2

## 2017-01-06 MED ORDER — POLYVINYL ALCOHOL 1.4 % OP SOLN
1.0000 [drp] | Freq: Three times a day (TID) | OPHTHALMIC | Status: DC
  Administered 2017-01-06 – 2017-01-09 (×9): 1 [drp] via OPHTHALMIC
  Filled 2017-01-06: qty 15

## 2017-01-06 MED ORDER — ENOXAPARIN SODIUM 30 MG/0.3ML ~~LOC~~ SOLN: 30.0000 mg | Freq: Two times a day (BID) | SUBCUTANEOUS | Status: DC

## 2017-01-06 MED ORDER — ROCURONIUM BROMIDE 50 MG/5ML IV SOSY
PREFILLED_SYRINGE | INTRAVENOUS | Status: DC | PRN
  Administered 2017-01-06 (×2): 10 mg via INTRAVENOUS
  Administered 2017-01-06: 50 mg via INTRAVENOUS
  Administered 2017-01-06 (×2): 10 mg via INTRAVENOUS

## 2017-01-06 MED ORDER — PROPOFOL 10 MG/ML IV BOLUS
INTRAVENOUS | Status: DC | PRN
  Administered 2017-01-06: 180 mg via INTRAVENOUS

## 2017-01-06 MED ORDER — SUGAMMADEX SODIUM 200 MG/2ML IV SOLN
INTRAVENOUS | Status: DC | PRN
  Administered 2017-01-06: 100 mg via INTRAVENOUS
  Administered 2017-01-06: 200 mg via INTRAVENOUS

## 2017-01-06 MED ORDER — LIDOCAINE 2% (20 MG/ML) 5 ML SYRINGE
INTRAMUSCULAR | Status: AC
  Filled 2017-01-06: qty 5

## 2017-01-06 MED ORDER — OXYCODONE HCL 5 MG/5ML PO SOLN
5.0000 mg | ORAL | Status: DC | PRN
  Administered 2017-01-07: 5 mg via ORAL
  Filled 2017-01-06: qty 5

## 2017-01-06 MED ORDER — BUPIVACAINE LIPOSOME 1.3 % IJ SUSP
20.0000 mL | Freq: Once | INTRAMUSCULAR | Status: DC
  Filled 2017-01-06: qty 20

## 2017-01-06 MED ORDER — DEXAMETHASONE SODIUM PHOSPHATE 10 MG/ML IJ SOLN
INTRAMUSCULAR | Status: AC
  Filled 2017-01-06: qty 1

## 2017-01-06 MED ORDER — FENTANYL CITRATE (PF) 100 MCG/2ML IJ SOLN
INTRAMUSCULAR | Status: DC | PRN
  Administered 2017-01-06 (×6): 25 ug via INTRAVENOUS

## 2017-01-06 MED ORDER — PROMETHAZINE HCL 25 MG/ML IJ SOLN: 12.5000 mg | Freq: Four times a day (QID) | INTRAMUSCULAR | Status: DC | PRN

## 2017-01-06 MED ORDER — CEFOTETAN DISODIUM-DEXTROSE 2-2.08 GM-% IV SOLR
INTRAVENOUS | Status: AC
  Filled 2017-01-06: qty 50

## 2017-01-06 MED ORDER — PHENYLEPHRINE 40 MCG/ML (10ML) SYRINGE FOR IV PUSH (FOR BLOOD PRESSURE SUPPORT)
PREFILLED_SYRINGE | INTRAVENOUS | Status: AC
  Filled 2017-01-06: qty 10

## 2017-01-06 MED ORDER — MORPHINE SULFATE (PF) 2 MG/ML IV SOLN
1.0000 mg | INTRAVENOUS | Status: DC | PRN
  Administered 2017-01-06: 2 mg via INTRAVENOUS
  Administered 2017-01-06: 1 mg via INTRAVENOUS
  Administered 2017-01-07: 2 mg via INTRAVENOUS
  Filled 2017-01-06 (×3): qty 1

## 2017-01-06 MED ORDER — GABAPENTIN 300 MG PO CAPS
300.0000 mg | ORAL_CAPSULE | Freq: Two times a day (BID) | ORAL | Status: DC
  Administered 2017-01-06 – 2017-01-09 (×6): 300 mg via ORAL
  Filled 2017-01-06 (×6): qty 1

## 2017-01-06 MED ORDER — INSULIN ASPART 100 UNIT/ML ~~LOC~~ SOLN
0.0000 [IU] | SUBCUTANEOUS | Status: DC
  Administered 2017-01-06: 3 [IU] via SUBCUTANEOUS
  Administered 2017-01-06 – 2017-01-09 (×8): 2 [IU] via SUBCUTANEOUS

## 2017-01-06 MED ORDER — RISPERIDONE 1 MG PO TABS
1.0000 mg | ORAL_TABLET | Freq: Three times a day (TID) | ORAL | Status: DC
  Administered 2017-01-06 – 2017-01-09 (×8): 1 mg via ORAL
  Filled 2017-01-06 (×8): qty 1

## 2017-01-06 MED ORDER — ONDANSETRON HCL 4 MG/2ML IJ SOLN: 4.0000 mg | Freq: Once | INTRAMUSCULAR | Status: DC | PRN

## 2017-01-06 MED ORDER — CEFOTETAN DISODIUM-DEXTROSE 2-2.08 GM-% IV SOLR
2.0000 g | INTRAVENOUS | Status: AC
  Administered 2017-01-06: 2 g via INTRAVENOUS

## 2017-01-06 MED ORDER — SUCCINYLCHOLINE CHLORIDE 200 MG/10ML IV SOSY
PREFILLED_SYRINGE | INTRAVENOUS | Status: AC
  Filled 2017-01-06: qty 10

## 2017-01-06 MED ORDER — ACETAMINOPHEN 160 MG/5ML PO SOLN
325.0000 mg | ORAL | Status: DC | PRN
Start: 2017-01-06 — End: 2017-01-09
  Administered 2017-01-07 – 2017-01-09 (×6): 650 mg via ORAL
  Filled 2017-01-06 (×6): qty 20.3

## 2017-01-06 MED ORDER — PHENYLEPHRINE HCL 10 MG/ML IJ SOLN
INTRAMUSCULAR | Status: DC | PRN
  Administered 2017-01-06 (×3): 80 ug via INTRAVENOUS

## 2017-01-06 MED ORDER — SODIUM CHLORIDE 0.9 % IJ SOLN
INTRAMUSCULAR | Status: AC
  Filled 2017-01-06: qty 50

## 2017-01-06 MED ORDER — PREMIER PROTEIN SHAKE
2.0000 [oz_av] | ORAL | Status: DC
  Administered 2017-01-08 – 2017-01-09 (×16): 2 [oz_av] via ORAL

## 2017-01-06 MED ORDER — ONDANSETRON HCL 4 MG/2ML IJ SOLN: 4.0000 mg | Freq: Four times a day (QID) | INTRAMUSCULAR | Status: DC | PRN

## 2017-01-06 MED ORDER — LACTATED RINGERS IV SOLN
INTRAVENOUS | Status: DC | PRN
  Administered 2017-01-06 (×2): via INTRAVENOUS

## 2017-01-06 MED ORDER — PANTOPRAZOLE SODIUM 40 MG IV SOLR
40.0000 mg | Freq: Every day | INTRAVENOUS | Status: DC
  Administered 2017-01-06 – 2017-01-08 (×3): 40 mg via INTRAVENOUS
  Filled 2017-01-06 (×3): qty 40

## 2017-01-06 MED ORDER — ONDANSETRON HCL 4 MG/2ML IJ SOLN
INTRAMUSCULAR | Status: DC | PRN
  Administered 2017-01-06: 4 mg via INTRAVENOUS

## 2017-01-06 MED ORDER — SODIUM CHLORIDE 0.9 % IV SOLN
500.0000 mg | Freq: Two times a day (BID) | INTRAVENOUS | Status: DC
  Administered 2017-01-06 – 2017-01-09 (×7): 500 mg via INTRAVENOUS
  Filled 2017-01-06 (×8): qty 5

## 2017-01-06 MED ORDER — MIDAZOLAM HCL 2 MG/2ML IJ SOLN
INTRAMUSCULAR | Status: AC
  Filled 2017-01-06: qty 2

## 2017-01-06 MED ORDER — MEPERIDINE HCL 50 MG/ML IJ SOLN: 6.2500 mg | INTRAMUSCULAR | Status: DC | PRN

## 2017-01-06 MED ORDER — FENTANYL CITRATE (PF) 100 MCG/2ML IJ SOLN
INTRAMUSCULAR | Status: AC
  Filled 2017-01-06: qty 2

## 2017-01-06 MED ORDER — APREPITANT 40 MG PO CAPS
40.0000 mg | ORAL_CAPSULE | ORAL | Status: AC
  Administered 2017-01-06: 40 mg via ORAL
  Filled 2017-01-06: qty 1

## 2017-01-06 MED ORDER — BUPROPION HCL ER (XL) 300 MG PO TB24
300.0000 mg | ORAL_TABLET | Freq: Every day | ORAL | Status: DC
  Administered 2017-01-07 – 2017-01-09 (×3): 300 mg via ORAL
  Filled 2017-01-06 (×3): qty 1

## 2017-01-06 MED ORDER — DIPHENHYDRAMINE HCL 50 MG/ML IJ SOLN: 12.5000 mg | Freq: Three times a day (TID) | INTRAMUSCULAR | Status: DC | PRN

## 2017-01-06 MED ORDER — HYDROMORPHONE HCL 1 MG/ML IJ SOLN
INTRAMUSCULAR | Status: AC
  Administered 2017-01-06: 0.25 mg via INTRAVENOUS
  Filled 2017-01-06: qty 2

## 2017-01-06 MED ORDER — LIDOCAINE 2% (20 MG/ML) 5 ML SYRINGE
INTRAMUSCULAR | Status: DC | PRN
  Administered 2017-01-06: 60 mg via INTRAVENOUS

## 2017-01-06 MED ORDER — LACTATED RINGERS IR SOLN
Status: DC | PRN
  Administered 2017-01-06: 1000 mL

## 2017-01-06 SURGICAL SUPPLY — 74 items
APPLICATOR COTTON TIP 6IN STRL (MISCELLANEOUS) ×24 IMPLANT
APPLIER CLIP ROT 10 11.4 M/L (STAPLE)
APPLIER CLIP ROT 13.4 12 LRG (CLIP)
BANDAGE ADH SHEER 1  50/CT (GAUZE/BANDAGES/DRESSINGS) ×18 IMPLANT
BENZOIN TINCTURE PRP APPL 2/3 (GAUZE/BANDAGES/DRESSINGS) ×3 IMPLANT
BLADE SURG SZ11 CARB STEEL (BLADE) ×3 IMPLANT
CABLE HIGH FREQUENCY MONO STRZ (ELECTRODE) ×3 IMPLANT
CHLORAPREP W/TINT 26ML (MISCELLANEOUS) ×6 IMPLANT
CLIP APPLIE ROT 10 11.4 M/L (STAPLE) IMPLANT
CLIP APPLIE ROT 13.4 12 LRG (CLIP) IMPLANT
CLOSURE WOUND 1/2 X4 (GAUZE/BANDAGES/DRESSINGS) ×1
DECANTER SPIKE VIAL GLASS SM (MISCELLANEOUS) ×3 IMPLANT
DERMABOND ADVANCED (GAUZE/BANDAGES/DRESSINGS)
DERMABOND ADVANCED .7 DNX12 (GAUZE/BANDAGES/DRESSINGS) IMPLANT
DEVICE SUT QUICK LOAD TK 5 (STAPLE) ×2 IMPLANT
DEVICE SUT TI-KNOT TK 5X26 (MISCELLANEOUS) ×2 IMPLANT
DEVICE SUTURE ENDOST 10MM (ENDOMECHANICALS) ×3 IMPLANT
DEVICE TI KNOT TK5 (MISCELLANEOUS) ×1
DRAPE UTILITY XL STRL (DRAPES) ×6 IMPLANT
ELECT L-HOOK LAP 45CM DISP (ELECTROSURGICAL)
ELECT PENCIL ROCKER SW 15FT (MISCELLANEOUS) IMPLANT
ELECT REM PT RETURN 15FT ADLT (MISCELLANEOUS) ×3 IMPLANT
ELECTRODE L-HOOK LAP 45CM DISP (ELECTROSURGICAL) IMPLANT
GAUZE SPONGE 2X2 8PLY STRL LF (GAUZE/BANDAGES/DRESSINGS) IMPLANT
GAUZE SPONGE 4X4 12PLY STRL (GAUZE/BANDAGES/DRESSINGS) IMPLANT
GLOVE BIO SURGEON STRL SZ7.5 (GLOVE) ×3 IMPLANT
GLOVE INDICATOR 8.0 STRL GRN (GLOVE) ×3 IMPLANT
GOWN STRL REUS W/TWL XL LVL3 (GOWN DISPOSABLE) ×9 IMPLANT
GRASPER SUT TROCAR 14GX15 (MISCELLANEOUS) IMPLANT
HOVERMATT SINGLE USE (MISCELLANEOUS) ×3 IMPLANT
IRRIG SUCT STRYKERFLOW 2 WTIP (MISCELLANEOUS)
IRRIGATION SUCT STRKRFLW 2 WTP (MISCELLANEOUS) IMPLANT
KIT BASIN OR (CUSTOM PROCEDURE TRAY) ×3 IMPLANT
KIT DEFENDO BUTTON (KITS) ×3 IMPLANT
MARKER SKIN DUAL TIP RULER LAB (MISCELLANEOUS) ×3 IMPLANT
NEEDLE SPNL 22GX3.5 QUINCKE BK (NEEDLE) ×3 IMPLANT
PACK UNIVERSAL I (CUSTOM PROCEDURE TRAY) ×3 IMPLANT
QUICK LOAD TK 5 (STAPLE) ×1
RELOAD STAPLER BLUE 60MM (STAPLE) ×3 IMPLANT
RELOAD STAPLER GOLD 60MM (STAPLE) ×1 IMPLANT
RELOAD STAPLER GREEN 60MM (STAPLE) ×2 IMPLANT
SCISSORS LAP 5X45 EPIX DISP (ENDOMECHANICALS) ×3 IMPLANT
SEALANT SURGICAL APPL DUAL CAN (MISCELLANEOUS) IMPLANT
SET IRRIG TUBING LAPAROSCOPIC (IRRIGATION / IRRIGATOR) ×3 IMPLANT
SHEARS HARMONIC ACE PLUS 45CM (MISCELLANEOUS) ×3 IMPLANT
SLEEVE ADV FIXATION 5X100MM (TROCAR) ×6 IMPLANT
SLEEVE GASTRECTOMY 40FR VISIGI (MISCELLANEOUS) ×3 IMPLANT
SOLUTION ANTI FOG 6CC (MISCELLANEOUS) ×3 IMPLANT
SPONGE GAUZE 2X2 STER 10/PKG (GAUZE/BANDAGES/DRESSINGS)
SPONGE LAP 18X18 X RAY DECT (DISPOSABLE) ×3 IMPLANT
STAPLER ECHELON BIOABSB 60 FLE (MISCELLANEOUS) ×15 IMPLANT
STAPLER ECHELON LONG 60 440 (INSTRUMENTS) IMPLANT
STAPLER RELOAD BLUE 60MM (STAPLE) ×9
STAPLER RELOAD GOLD 60MM (STAPLE) ×3
STAPLER RELOAD GREEN 60MM (STAPLE) ×6
STRIP CLOSURE SKIN 1/2X4 (GAUZE/BANDAGES/DRESSINGS) ×2 IMPLANT
SUT MNCRL AB 4-0 PS2 18 (SUTURE) ×3 IMPLANT
SUT SURGIDAC NAB ES-9 0 48 120 (SUTURE) ×3 IMPLANT
SUT VICRYL 0 TIES 12 18 (SUTURE) ×3 IMPLANT
SYR 10ML ECCENTRIC (SYRINGE) ×3 IMPLANT
SYR 20CC LL (SYRINGE) ×3 IMPLANT
SYR 50ML LL SCALE MARK (SYRINGE) ×3 IMPLANT
SYRINGE 60CC LL (MISCELLANEOUS) ×2 IMPLANT
TOWEL OR 17X26 10 PK STRL BLUE (TOWEL DISPOSABLE) ×3 IMPLANT
TOWEL OR NON WOVEN STRL DISP B (DISPOSABLE) ×3 IMPLANT
TRAY FOLEY W/METER SILVER 16FR (SET/KITS/TRAYS/PACK) IMPLANT
TROCAR ADV FIXATION 5X100MM (TROCAR) ×3 IMPLANT
TROCAR BLADELESS 15MM (ENDOMECHANICALS) ×3 IMPLANT
TROCAR BLADELESS OPT 5 100 (ENDOMECHANICALS) ×3 IMPLANT
TUBING CONNECTING 10 (TUBING) ×6 IMPLANT
TUBING CONNECTING 10' (TUBING) ×3
TUBING ENDO SMARTCAP (MISCELLANEOUS) ×3 IMPLANT
TUBING ENDO SMARTCAP PENTAX (MISCELLANEOUS) ×3 IMPLANT
TUBING INSUF HEATED (TUBING) ×3 IMPLANT

## 2017-01-06 NOTE — H&P (View-Only) (Signed)
Husam Hohn 12/31/2016 12:00 PM Location: [facility] Patient #: [id number] DOB: 06/01/1946 Widowed / Language: [name] / Race: Black or African American Male  History of Present Illness Randall Hiss M. Esta Carmon MD; 01/01/2017 4:34 PM) The patient is a 71 year old male who presents for a bariatric surgery evaluation. He comes in today for his preoperative appointment. He denies any medical issues or events since I last saw him about 2 months ago. He denies any trips the emergency room hospital. He denies any chest pain or chest pressure. He reports that he is been getting around well. He has been on his preoperative meal plan and has been drinking his protein shakes. I spoke with his brother since his last visit to serves as his power of attorney to discuss weight loss surgery to make sure he was aware what was going on and he voiced understanding and support for his brother to undergo weight loss surgery.  Review of systems-conference a 12 point review systems was performed and all systems are negative except for what is mentioned in HPI   10/29/16 He comes in today to rediscuss weight loss surgery. I initially met him about last June. He has completed his supervised weight loss requirement. He has met with psychology as well. He has demonstrated compliance with this supervised weight loss visits. He is still interested in proceeding with weight loss surgery. He had 2 trips to the emergency room since I last saw him for falls. And he had a 1 night stay for urinary tract infection. His social situation has not changed. A friend down the hall takes him to the grocery store several times a month. Other than that he uses public transportation. He has ongoing regular follow-up with Dr. Robina Ade at [facility]. He denies chest pain, chest pressure, short of breath, paroxysmal nocturnal dyspnea, TIAs or amaurosis fugax. He states that he will get short of breath after walking 1  block. He denies any regular nausea or vomiting. He denies any abdominal pain. He denies any indigestion heartburn better sour taste. He states that he normally eats a meal in about 15-20 minutes. He denies any melena or hematochezia. He reports daily bowel movements.  Review of systems-comprehensive a 12 point review systems was performed and all systems are negative except for what is mentioned in HPI  01/10/2016 He is referred by Dr Wallene Huh for evaluation of weight loss surgery. He has seen Dr. Terrence Dupont in the past for cardiology. Dr Rachael Fee manages his chronic pain. His psychological health is managed at [facility]). He is interested in the sleeve gastrectomy. He attended our seminar in person. His mother and sister have had a sleep gastrectomy and has done well. He is interested in improving his health. He wants to be able to walk a mile again without difficulty. He wants to decrease the number medications he takes. He states that he has struggled with his weight many years but more specifically the last 5 years. He has tried self diets, regular physical activity, healthy meals, as well as physician supervised weight loss-all without any long-term success.  His comorbidities include hypertension, dyslipidemia, history of hepatitis C, diabetes mellitus type 2, bipolar disease, seizure disorder, TIA  He denies any chest pain, chest pressure, shortness of breath, orthopnea or paroxysmal nocturnal dyspnea. He does get winded with physical activity. He had an apparent TIA in April 2016. He was admitted for workup and evaluation. There is no signs of acute stroke  on CT or MRI. His carotid ultrasound was within normal limits with no significant plaque burden. He had an echocardiogram at that time which showed an ejection fraction of 45-50% with diffuse hypokinesis. He denies any current symptoms of TIAs or amaurosis fugax. He states that he had a  study 15 years ago and it was negative. He denies any heartburn, reflux or indigestion. He denies any abdominal pain. He has a bowel movement every other day. He reports a normal colonoscopy. He states that he was treated for his hepatitis C. He denies any prior abdominal surgery. He denies any dysuria or hematuria. He has low back pain for which he takes hydrocodone twice a day. He states that he has diet controlled diabetes mellitus. He has not had a seizure in quite some time.  He denies any tobacco, alcohol, or drug use.  He lives in a group home called [facility]. He has a small refrigerator and microwave in his room. He tends to eat frozen meals. His brother serves as his medical power of attorney. He lives about half a mile away. He believes he could stay at his brother's house after surgery for short time. The patient does not drive.  He states that he takes his bipolar medications. However he has in the past stopped taking his medication however that was more than 5 years ago   Problem List/Past Medical Randall Hiss M. 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 Redmond Pulling, MD; 01/01/2017 4:36 PM) Colon Polyp Removal - Colonoscopy  Diagnostic Studies History Randall Hiss M. Redmond Pulling, MD; 01/01/2017 4:36 PM) Colonoscopy 1-5 years ago  Allergies Dalbert Mayotte, CMA; 12/31/2016 11:50 AM) No Known Drug Allergies 01/10/2016 Allergies Reconciled  Medication History Randall Hiss M. Redmond Pulling, MD; 01/01/2017 4:36 PM) BuPROPion HCl ER (XL) (300MG Tablet ER 24HR, Oral) Active. Tamsulosin HCl (0.4MG Capsule, Oral) Active. Gabapentin (400MG Capsule, Oral two times  daily) Active. HydroCHLOROthiazide (12.5MG Capsule, Oral) Active. Keppra (500MG Tablet, Oral two times daily) Active. ClonazePAM (1MG Tablet, Oral 4 times a day) Active. RisperiDONE (1MG Tablet, Oral two times daily) Active. Doxepin HCl (10MG Capsule, Oral two times nightly) Active. Docusate Sodium (100MG Capsule, Oral) Active. Atorvastatin Calcium (40MG Tablet, Oral) Active. Hydrocodone-Acetaminophen (10-325MG Tablet, Oral four times daily) Active. Medications Reconciled Ondansetron HCl (4MG Tablet, 1 (one) Tablet Oral every eight hours, as needed, Taken starting 12/31/2016) Active. Pantoprazole Sodium (40MG Tablet DR, 1 (one) Tablet Oral daily, Taken starting 12/31/2016) Active.  Social History Randall Hiss M. Redmond Pulling, MD; 01/01/2017 4:36 PM) No alcohol use No caffeine use No drug use Tobacco use Never smoker.  Family History Randall Hiss M. Redmond Pulling, MD; 01/01/2017 4:36 PM) Alcohol Abuse Daughter. Diabetes Mellitus Brother, Father, Sister. Family history unknown First Degree Relatives  Other Problems Randall Hiss M. Redmond Pulling, MD; 01/01/2017 4:36 PM) Arthritis Back Pain Bladder Problems Depression Diabetes Mellitus    Vitals Dalbert Mayotte CMA; 12/31/2016 11:53 AM) 12/31/2016 11:52 AM Weight: 2735 lb Height: 68in Body Surface Area: 6.21 m Body Mass Index: 415.85 kg/m  Temp.: 97.74F  Pulse: 92 (Regular)  BP: 120/82 (Sitting, Left Arm, Standard)      Physical Exam Randall Hiss M. Mychal Durio MD; 01/01/2017 4:32 PM)  General Mental Status-Alert. General Appearance-Consistent with stated age. Hydration-Well hydrated. Voice-Normal. Note: Morbidly obese, central  truncal obesity  Integumentary Note: Trace ankle edema  Head and Neck Head-normocephalic, atraumatic with no lesions or palpable masses. Trachea-midline. Thyroid Gland Characteristics - normal size and consistency.  Eye Eyeball - Bilateral-Extraocular movements intact. Sclera/Conjunctiva -  Bilateral-No scleral icterus.  ENMT Note: normal external ears. normal dentition  Chest and Lung Exam Chest and lung exam reveals -quiet, even and easy respiratory effort with no use of accessory muscles and on auscultation, normal breath sounds, no adventitious sounds and normal vocal resonance. Inspection Chest Wall - Normal. Back - normal.  Breast - Did not examine.  Cardiovascular Cardiovascular examination reveals -normal heart sounds, regular rate and rhythm with no murmurs and normal pedal pulses bilaterally.  Abdomen Inspection Inspection of the abdomen reveals - No Hernias. Skin - Scar - no surgical scars. Palpation/Percussion Palpation and Percussion of the abdomen reveal - Soft, Non Tender, No Rebound tenderness, No Rigidity (guarding) and No hepatosplenomegaly. Auscultation Auscultation of the abdomen reveals - Bowel sounds normal.  Peripheral Vascular Upper Extremity Palpation - Pulses bilaterally normal.  Neurologic Neurologic evaluation reveals -alert and oriented x 3 with no impairment of recent or remote memory. Mental Status-Normal.  Neuropsychiatric The patient's mood and affect are described as -not angry, not anxious, not agitated. Note:flat Associations-intact. Judgment and Insight-insight is appropriate concerning matters relevant to self. Speech-slow.  Musculoskeletal Normal Exam - Left-Upper Extremity Strength Normal and Lower Extremity Strength Normal. Normal Exam - Right-Upper Extremity Strength Normal and Lower Extremity Strength Normal.  Lymphatic Head & Neck  General Head & Neck Lymphatics: Bilateral - Description - Normal. Axillary - Did not examine. Femoral & Inguinal - Did not examine.    Assessment & Plan Randall Hiss M. Jairon Ripberger MD; 01/01/2017 4:36 PM)  MORBID OBESITY WITH BMI OF 40.0-44.9, ADULT (E66.01) Impression: We reviewed his preoperative workup again. I answered all of his questions regarding the hospital and  postoperative course that he had today. I encouraged him to continue to work on his preoperative diet. He was given his postoperative prescriptions today.  Current Plans Pt Education - EMW_preopbariatric Started Ondansetron HCl 4MG, 1 (one) Tablet every eight hours, as needed, #20, 12/31/2016, No Refill. Started Pantoprazole Sodium 40MG, 1 (one) Tablet daily, #30, 12/31/2016, Ref. x1. 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 He is oriented 3. he filled out his paperwork for intake today appropriately. He personal letter was legible and well written. He has demonstrated compliance with his required appointments. He has been compliant with his behavioral medication. Psychology evaluated him and felt that he was capable of making an informed decision. I did base with his brother who is his power of attorney to discuss surgery as well.  SEIZURE DISORDER (G40.909)  ELEVATED LDL CHOLESTEROL LEVEL (E78.00)  HYPERTENSION, ESSENTIAL (I10)  DIABETES MELLITUS TYPE 2 IN OBESE (E11.69) Story: A1C 6 in 4/17  Leighton Ruff. Redmond Pulling, MD, FACS General, Bariatric, & Minimally Invasive Surgery [facility], [place]'

## 2017-01-06 NOTE — Anesthesia Procedure Notes (Signed)
Procedure Name: Intubation Date/Time: 01/06/2017 7:24 AM Performed by: Dione Booze Pre-anesthesia Checklist: Emergency Drugs available, Suction available, Patient being monitored and Patient identified Patient Re-evaluated:Patient Re-evaluated prior to inductionOxygen Delivery Method: Circle system utilized Preoxygenation: Pre-oxygenation with 100% oxygen Intubation Type: IV induction Ventilation: Mask ventilation without difficulty Laryngoscope Size: Mac and 4 Grade View: Grade I Tube type: Oral Tube size: 7.5 mm Number of attempts: 1 Airway Equipment and Method: Stylet Placement Confirmation: ETT inserted through vocal cords under direct vision,  positive ETCO2 and breath sounds checked- equal and bilateral Secured at: 22 cm Tube secured with: Tape Dental Injury: Teeth and Oropharynx as per pre-operative assessment

## 2017-01-06 NOTE — Op Note (Signed)
Preoperative diagnosis: laparoscopic sleeve gastrectomy  Postoperative diagnosis: Same   Procedure: Upper endoscopy   Surgeon: Wrigley Plasencia A Stormee Duda, M.D.  Anesthesia: Gen.   Indications for procedure: This patient was undergoing a laparoscopic sleeve gastrectomy.   Description of procedure: The endoscopy was placed in the mouth and into the oropharynx and under endoscopic vision it was advanced to the esophagogastric junction. The pouch was insufflated and no bleeding or bubbles were seen. The GEJ was identified at 43cm from the teeth.  No bleeding or leaks were detected. The scope was withdrawn without difficulty.   Aeliana Spates A Miguelangel Korn, M.D. General, Bariatric, & Minimally Invasive Surgery [facility]    

## 2017-01-06 NOTE — Interval H&P Note (Signed)
History and Physical Interval Note:  01/06/2017 7:08 AM  Terry Baxter  has presented today for surgery, with the diagnosis of MORBID OBESITY  The various methods of treatment have been discussed with the patient and family. After consideration of risks, benefits and other options for treatment, the patient has consented to  Procedure(s): LAPAROSCOPIC GASTRIC SLEEVE RESECTION WITH UPPER ENDO (N/A) as a surgical intervention .  The patient's history has been reviewed, patient examined, no change in status, stable for surgery.  I have reviewed the patient's chart and labs.  Questions were answered to the patient's satisfaction.    Leighton Ruff. Redmond Pulling, MD, [place], Bariatric, & Minimally Invasive Surgery [facility] Surgery, [place]  [facility] M

## 2017-01-06 NOTE — Transfer of Care (Signed)
Immediate Anesthesia Transfer of Care Note  Patient: Akira Adelsberger  Procedure(s) Performed: Procedure(s): LAPAROSCOPIC GASTRIC SLEEVE RESECTION WITH UPPER ENDO,HIATAL HERNIA REPAIR (N/A)  Patient Location: PACU  Anesthesia Type:General  Level of Consciousness: awake, alert  and patient cooperative  Airway & Oxygen Therapy: Patient Spontanous Breathing and Patient connected to face mask oxygen  Post-op Assessment: Report given to RN and Post -op Vital signs reviewed and stable  Post vital signs: Reviewed and stable  Last Vitals:  Vitals:   01/06/17 0534  BP: 113/69  Pulse: 71  Resp: 18  Temp: 36.9 C    Last Pain:  Vitals:   01/06/17 0534  TempSrc: Oral      Patients Stated Pain Goal: 3 (79/72/82 0601)  Complications: No apparent anesthesia complications

## 2017-01-06 NOTE — Anesthesia Postprocedure Evaluation (Signed)
Anesthesia Post Note  Patient: Terry Baxter  Procedure(s) Performed: Procedure(s) (LRB): LAPAROSCOPIC GASTRIC SLEEVE RESECTION WITH UPPER ENDO,HIATAL HERNIA REPAIR (N/A)     Patient location during evaluation: PACU Anesthesia Type: General Level of consciousness: awake and alert Pain management: pain level controlled Vital Signs Assessment: post-procedure vital signs reviewed and stable Respiratory status: spontaneous breathing, nonlabored ventilation, respiratory function stable and patient connected to nasal cannula oxygen Cardiovascular status: blood pressure returned to baseline and stable Postop Assessment: no signs of nausea or vomiting Anesthetic complications: no    Last Vitals:  Vitals:   01/06/17 1155 01/06/17 1304  BP: 128/90 (!) 130/92  Pulse: 85 (!) 110  Resp: 18 18  Temp: 36.9 C 36.9 C    Last Pain:  Vitals:   01/06/17 1304  TempSrc: Oral  PainSc:                  Terry Baxter DAVID

## 2017-01-06 NOTE — Progress Notes (Signed)
In to see patient post op, nurses with concern about patient ability to ambulate and communication.  Explained patient needed simple direct commands. Able to move patient 30 feet to walk from room to hallway with 2 assist then patient placed in chair.  Several episodes of incontinence.  Not new patient has documented incontinence at home which patient confirmed.  Chair alarm placed patient instructed to not get up unassisted.

## 2017-01-06 NOTE — OR Nursing (Signed)
Attempted to put in an in and out 73F foley in, met resistance, a little blood. Mohammed Kindle, RN

## 2017-01-06 NOTE — Progress Notes (Signed)
Pt was difficult to move today.  He can follow commands but is very weak and unsteady.  Was delayed in taking his fluids due to his inability to stand and then walk. About 1730 her was ambulated from his bed to across the hall from his room.  Tolerated one 2 oz cup of water so far and has been medicated for his pain.  Will continue to monitor.

## 2017-01-06 NOTE — Anesthesia Preprocedure Evaluation (Signed)
Anesthesia Evaluation  Patient identified by MRN, date of birth, ID band Patient awake    Reviewed: Allergy & Precautions, NPO status , Patient's Chart, lab work & pertinent test results  Airway Mallampati: II  TM Distance: >3 FB Neck ROM: Full    Dental   Pulmonary    Pulmonary exam normal        Cardiovascular hypertension, Pt. on medications Normal cardiovascular exam     Neuro/Psych Anxiety Bipolar Disorder TIACVA    GI/Hepatic (+) Hepatitis -, C  Endo/Other  diabetes, Type 2, Oral Hypoglycemic AgentsMorbid obesity  Renal/GU Renal InsufficiencyRenal disease     Musculoskeletal   Abdominal   Peds  Hematology   Anesthesia Other Findings   Reproductive/Obstetrics                             Anesthesia Physical Anesthesia Plan  ASA: III  Anesthesia Plan: General   Post-op Pain Management:    Induction: Intravenous  PONV Risk Score and Plan: 2 and Ondansetron and Dexamethasone  Airway Management Planned: Oral ETT  Additional Equipment:   Intra-op Plan:   Post-operative Plan: Extubation in OR  Informed Consent: I have reviewed the patients History and Physical, chart, labs and discussed the procedure including the risks, benefits and alternatives for the proposed anesthesia with the patient or authorized representative who has indicated his/her understanding and acceptance.     Plan Discussed with: CRNA and Surgeon  Anesthesia Plan Comments:         Anesthesia Quick Evaluation

## 2017-01-06 NOTE — Op Note (Signed)
01/06/2017 Terry Baxter [birth year] [id number]   PRE-OPERATIVE DIAGNOSIS:     Morbid obesity (BMI 42)   Seizure disorder (HCC)   Bipolar disorder ([place])   HLD (hyperlipidemia)   Type 2 diabetes mellitus with other circulatory complications ([place])   Essential hypertension   Hiatal hernia  POST-OPERATIVE DIAGNOSIS:  same  PROCEDURE:  Procedure(s): LAPAROSCOPIC SLEEVE GASTRECTOMY WITH HIATAL HERNIA REPAIR UPPER GI ENDOSCOPY  SURGEON:  Surgeon(s): Gayland Curry, MD FACS FASMBS  ASSISTANTS: Romana Juniper MD  ANESTHESIA:   general  DRAINS: none   BOUGIE: 40 fr ViSiGi  LOCAL MEDICATIONS USED:  MARCAINE + Exparel  SPECIMEN:  Source of Specimen:  Greater curvature of stomach  DISPOSITION OF SPECIMEN:  PATHOLOGY  EBL: minimal   COUNTS:  YES  INDICATION FOR PROCEDURE: This is a very pleasant 71 y.o.-year-old morbidly obese male who has had unsuccessful attempts for sustained weight loss. The patient presents today for a planned laparoscopic sleeve gastrectomy with upper endoscopy. We have discussed the risk and benefits of the procedure extensively preoperatively. Please see my separate notes.  PROCEDURE: After obtaining informed consent and receiving 5000 units of subcutaneous heparin, the patient was brought to the operating room at [facility] and placed supine on the operating room table. General endotracheal anesthesia was established. Sequential compression devices were placed. A orogastric tube was placed. The patient's abdomen was prepped and draped in the usual standard surgical fashion. The patient received preoperative IV antibiotic. A surgical timeout was performed.  Access to the abdomen was achieved using a 5 mm 0 laparoscope thru a 5 mm trocar In the left upper Quadrant 2 fingerbreadths below the left subcostal margin using the Optiview technique. Pneumoperitoneum was smoothly established up to 15 mm of mercury. The laparoscope was advanced and the abdominal  cavity was surveilled. The patient was then placed in reverse Trendelenburg.   A 5 mm trocar was placed slightly above and to the left of the umbilicus under direct visualization.  The [facility] liver retractor was placed under the left lobe of the liver through a 5 mm trocar incision site in the subxiphoid position. A 5 mm trocar was placed in the lateral right upper quadrant along with a 15 mm trocar in the mid right abdomen. A final 5 mm trocar was placed in the lateral LUQ.  All under direct visualization after exparel had been infiltrated in bilateral lateral upper abdominal walls as a TAP block  The stomach was inspected. It was completely decompressed and the orogastric tube was removed.  There is a small anterior dimple that was obviously visible. The calibration tube was placed in the oropharynx and guided down into the stomach by the CRNA. 10 mL of air was insufflated into the calibration balloon. The calibration tubing was then gently pulled back by the CRNA and it slid past the GE junction. At this point the calibration tubing was desufflated and pulled back into the esophagus. This confirmed my suspicion of a clinically significant hiatal hernia. The gastrohepatic ligament was incised with harmonic scalpel. The right crus was identified. We identified the crossing fat along the right crus. The adipose tissue just above this area was incised with harmonic scalpel. I then bluntly dissected out this area and identified the left crus. There was evidence of a small hiatal hernia. I then mobilized the esophagus. The left and right crus were further mobilized with blunt dissection. I was then able to reapproximate the left and right crus with 0 Ethibond using an [employer]  suture device and securing it with a titanium tyknot. We then had the CRNA readvanced the calibration tubing back into the stomach. 10 mL of air was insufflated into the calibration tube balloon. The calibration tube was then gently  pulled back and there was resistance at the GE junction. The tube did not slide back up into the esophagus. At this point the calibration tubing was deflated and removed from the patient's body.   We identified the pylorus and measured 6 cm proximal to the pylorus and identified an area of where we would start taking down the short gastric vessels. Harmonic scalpel was used to take down the short gastric vessels along the greater curvature of the stomach. We were able to enter the lesser sac. We continued to march along the greater curvature of the stomach taking down the short gastrics. As we approached the gastrosplenic ligament we took care in this area not to injure the spleen. We were able to take down the entire gastrosplenic ligament. We then mobilized the fundus away from the left crus of diaphragm. There were not any significant posterior gastric avascular attachments. This left the stomach completely mobilized. No vessels had been taken down along the lesser curvature of the stomach.  We then reidentified the pylorus. A 40Fr ViSiGi was then placed in the oropharynx and advanced down into the stomach and placed in the distal antrum and positioned along the lesser curvature. It was placed under suction which secured the 40Fr ViSiGi in place along the lesser curve. Then using the Ethicon echelon 60 mm stapler with a green load with Seamguard, I placed a stapler along the antrum approximately 5 cm from the pylorus. The stapler was angled so that there is ample room at the angularis incisura. I then fired the first staple load after inspecting it posteriorly to ensure adequate space both anteriorly and posteriorly. At this point I still was not completely past the angularis so with another green load with Seamguard, I placed the stapler in position just inside the prior stapleline. We then rotated the stomach to insure that there was adequate anteriorly as well as posteriorly. The stapler was then fired.   At this point I started using 60 mm gold load staple cartridge x 1 with Seamguard. The echelon stapler was then repositioned with a 60 mm blue load with Seamguard and we continued to march up along the [place]. My assistant was holding traction along the greater curvature stomach along the cauterized short gastric vessels ensuring that the stomach was symmetrically retracted. Prior to each firing of the staple, we rotated the stomach to ensure that there is adequate stomach left.  As we approached the fundus, I used 60 mm blue cartridge with Seamguard aiming slightly lateral to the esophageal fat pad. Although the staples on this fire had completely gone thru the last part of the stomach it had not completely cut it. Therefore 1 additional 60 blue load was used to free the remaining stomach. The sleeve was inspected. There is no evidence of cork screw. The staple line appeared hemostatic. The CRNA inflated the ViSiGi to the green zone and the upper abdomen was flooded with saline. There were no bubbles. The sleeve was decompressed and the ViSiGi removed. My assistant scrubbed out and performed an upper endoscopy. The sleeve easily distended with air and the scope was easily advanced to the pylorus. There is no evidence of internal bleeding or cork screwing. There was no narrowing at the angularis. There is no  evidence of bubbles. Please see his operative note for further details. The gastric sleeve was decompressed and the endoscope was removed.  The greater curvature the stomach was grasped with a laparoscopic grasper and removed from the 15 mm trocar site.  The liver retractor was removed. I then closed the 15 mm trocar site with 1 interrupted 0 Vicryl sutures through the fascia using the endoclose. The closure was viewed laparoscopically and it was airtight. Remaining Exparel was then infiltrated in the preperitoneal spaces around the trocar sites. Pneumoperitoneum was released. All trocar sites were closed with a  4-0 Monocryl in a subcuticular fashion followed by the application of benzoin, steri-strips, and bandages. The patient was extubated and taken to the recovery room in stable condition. All needle, instrument, and sponge counts were correct x2. There are no immediate complications  (2) 60 mm green with Seamguard (1) 60 mm gold with seamguard (3) 60 mm blue with 2 seamguard  PLAN OF CARE: Admit to inpatient   PATIENT DISPOSITION:  PACU - hemodynamically stable.   Delay start of Pharmacological VTE agent (>24hrs) due to surgical blood loss or risk of bleeding:  no  Leighton Ruff. Redmond Pulling, MD, FACS FASMBS General, Bariatric, & Minimally Invasive Surgery [facility], [place]

## 2017-01-07 ENCOUNTER — Encounter (HOSPITAL_COMMUNITY): Payer: Self-pay | Admitting: General Surgery

## 2017-01-07 ENCOUNTER — Inpatient Hospital Stay (HOSPITAL_COMMUNITY): Payer: Medicare Other

## 2017-01-07 LAB — COMPREHENSIVE METABOLIC PANEL
ALK PHOS: 60 U/L (ref 38–126)
ALT: 46 U/L (ref 17–63)
ANION GAP: 9 (ref 5–15)
AST: 99 U/L — ABNORMAL HIGH (ref 15–41)
Albumin: 3.2 g/dL — ABNORMAL LOW (ref 3.5–5.0)
BILIRUBIN TOTAL: 0.4 mg/dL (ref 0.3–1.2)
BUN: 12 mg/dL (ref 6–20)
CALCIUM: 8.6 mg/dL — AB (ref 8.9–10.3)
CO2: 23 mmol/L (ref 22–32)
Chloride: 107 mmol/L (ref 101–111)
Creatinine, Ser: 1.02 mg/dL (ref 0.61–1.24)
GFR calc non Af Amer: >60 mL/min (ref >60)
Glucose, Bld: 141 mg/dL — ABNORMAL HIGH (ref 65–99)
Potassium: 4 mmol/L (ref 3.5–5.1)
SODIUM: 139 mmol/L (ref 135–145)
TOTAL PROTEIN: 6.8 g/dL (ref 6.5–8.1)

## 2017-01-07 LAB — CBC AND DIFFERENTIAL
HCT: 36 — AB (ref 41–53)
HEMATOCRIT: 37 — AB (ref 41–53)
HEMOGLOBIN: 12.7 — AB (ref 13.5–17.5)
HEMOGLOBIN: 13.1 — AB (ref 13.5–17.5)
Platelets: 127 — AB (ref 150–399)
WBC: 30

## 2017-01-07 LAB — CBC WITH DIFFERENTIAL/PLATELET
BASOS PCT: 0 %
Basophils Absolute: 0 10*3/uL (ref 0.0–0.1)
EOS ABS: 0 10*3/uL (ref 0.0–0.7)
Eosinophils Relative: 0 %
HCT: 36.9 % — ABNORMAL LOW (ref 39.0–52.0)
HEMOGLOBIN: 13.1 g/dL (ref 13.0–17.0)
LYMPHS PCT: 4 %
Lymphs Abs: 1.2 10*3/uL (ref 0.7–4.0)
MCH: 32.5 pg (ref 26.0–34.0)
MCHC: 35.5 g/dL (ref 30.0–36.0)
MCV: 91.6 fL (ref 78.0–100.0)
Monocytes Absolute: 2.7 10*3/uL — ABNORMAL HIGH (ref 0.1–1.0)
Monocytes Relative: 9 %
NEUTROS ABS: 26.1 10*3/uL — AB (ref 1.7–7.7)
Neutrophils Relative %: 87 %
Platelets: 127 10*3/uL — ABNORMAL LOW (ref 150–400)
RBC: 4.03 MIL/uL — ABNORMAL LOW (ref 4.22–5.81)
RDW: 13.5 % (ref 11.5–15.5)
WBC: 30 10*3/uL — ABNORMAL HIGH (ref 4.0–10.5)

## 2017-01-07 LAB — GLUCOSE, CAPILLARY
GLUCOSE-CAPILLARY: 140 mg/dL — AB (ref 65–99)
Glucose-Capillary: 136 mg/dL — ABNORMAL HIGH (ref 65–99)
Glucose-Capillary: 133 mg/dL — ABNORMAL HIGH (ref 65–99)
Glucose-Capillary: 120 mg/dL — ABNORMAL HIGH (ref 65–99)
Glucose-Capillary: 130 mg/dL — ABNORMAL HIGH (ref 65–99)

## 2017-01-07 LAB — HEMOGLOBIN AND HEMATOCRIT, BLOOD
HEMATOCRIT: 35.6 % — AB (ref 39.0–52.0)
HEMOGLOBIN: 12.7 g/dL — AB (ref 13.0–17.0)

## 2017-01-07 LAB — HEPATIC FUNCTION PANEL: BILIRUBIN, TOTAL: 0.4

## 2017-01-07 LAB — BASIC METABOLIC PANEL
BUN: 12 (ref 4–21)
Creatinine: 1 (ref 0.6–1.3)
Glucose: 141
POTASSIUM: 4 (ref 3.4–5.3)
SODIUM: 139 (ref 137–147)

## 2017-01-07 MED ORDER — IOPAMIDOL (ISOVUE-300) INJECTION 61%
INTRAVENOUS | Status: AC
  Administered 2017-01-07: 50 mL via ORAL
  Filled 2017-01-07: qty 50

## 2017-01-07 NOTE — Evaluation (Signed)
Physical Therapy Evaluation Patient Details Name: Terry Baxter MRN: [id number] DOB: Jul 13, 1946 Today's Date: 01/07/2017   History of Present Illness  71 yo male s/p gastric sleeve; PMHx:  hypertension, dyslipidemia, history of hepatitis C, diabetes mellitus type 2, bipolar disease, seizure disorder, TIA  Clinical Impression  Pt admitted with above diagnosis. Pt currently with functional limitations due to the deficits listed below (see PT Problem List). Pt will benefit from skilled PT to increase their independence and safety with mobility to allow discharge to the venue listed below.   Pt states he rents a room in a boarding house, apparently does not have a lot of support and would benefit from SNF vs HHPT depending on progress; today he requires assist with bed mobility, transfers, and gait; will continue to follow     Follow Up Recommendations SNF (vs HHPT depending on progress)    Equipment Recommendations  None recommended by PT    Recommendations for Other Services       Precautions / Restrictions Precautions Precautions: Fall      Mobility  Bed Mobility Overal bed mobility: Needs Assistance Bed Mobility: Supine to Sit     Supine to sit: Min assist;HOB elevated     General bed mobility comments: assist to elevate trunk, incr time  Transfers Overall transfer level: Needs assistance Equipment used: Rolling walker (2 wheeled) Transfers: Sit to/from Stand Sit to Stand: Min assist;Mod assist         General transfer comment: 2 attempts to stand, assist with anterior-superior wt shift  Ambulation/Gait Ambulation/Gait assistance: Min assist Ambulation Distance (Feet): 94 Feet Assistive device: Rolling walker (2 wheeled) Gait Pattern/deviations: Step-through pattern;Wide base of support;Drifts right/left     General Gait Details: cues for RW position, posture and safety with turns  Stairs            Wheelchair Mobility    Modified Rankin (Stroke  Patients Only)       Balance Overall balance assessment: Needs assistance;History of Falls (pt denies, chart states hx of falls)   Sitting balance-Leahy Scale: Fair       Standing balance-Leahy Scale: Poor Standing balance comment: reliant on UEs for support                             Pertinent Vitals/Pain Pain Assessment: No/denies pain    Home Living Family/patient expects to be discharged to:: Private residence Living Arrangements: Alone   Type of Home: Other(Comment) (rents a room)       Home Layout: One level Home Equipment: Cane - single point;Walker - 2 wheels Additional Comments: each renter responsible for their own meals/groceries etc; pt reports he was IND prior to procedure    Prior Function Level of Independence: Independent with assistive device(s)               Hand Dominance        Extremity/Trunk Assessment   Upper Extremity Assessment Upper Extremity Assessment: Generalized weakness    Lower Extremity Assessment Lower Extremity Assessment: Generalized weakness       Communication   Communication: No difficulties  Cognition Arousal/Alertness: Awake/alert Behavior During Therapy: Flat affect Overall Cognitive Status: No family/caregiver present to determine baseline cognitive functioning Area of Impairment: Following commands                       Following Commands: Follows one step commands with increased time;Follows multi-step commands with increased time  General Comments      Exercises     Assessment/Plan    PT Assessment Patient needs continued PT services  PT Problem List Decreased strength;Decreased activity tolerance;Decreased balance;Decreased mobility       PT Treatment Interventions DME instruction;Gait training;Functional mobility training;Therapeutic activities;Therapeutic exercise;Patient/family education    PT Goals (Current goals can be found in the Care Plan  section)  Acute Rehab PT Goals Patient Stated Goal: to go to rehab PT Goal Formulation: With patient Time For Goal Achievement: 01/14/17 Potential to Achieve Goals: Good    Frequency Min 3X/week   Barriers to discharge        Co-evaluation               AM-PAC PT "6 Clicks" Daily Activity  Outcome Measure Difficulty turning over in bed (including adjusting bedclothes, sheets and blankets)?: A Little Difficulty moving from lying on back to sitting on the side of the bed? : A Little Difficulty sitting down on and standing up from a chair with arms (e.g., wheelchair, bedside commode, etc,.)?: A Little Help needed moving to and from a bed to chair (including a wheelchair)?: A Little Help needed walking in hospital room?: A Little Help needed climbing 3-5 steps with a railing? : A Lot 6 Click Score: 17    End of Session Equipment Utilized During Treatment: Gait belt Activity Tolerance: Patient tolerated treatment well Patient left: in bed;with call bell/phone within reach (x4 rails at pt request)   PT Visit Diagnosis: Difficulty in walking, not elsewhere classified (R26.2)    Time: 2549-8264 PT Time Calculation (min) (ACUTE ONLY): 17 min   Charges:   PT Evaluation $PT Eval Low Complexity: 1 Procedure     PT G CodesKenyon Ana, PT Pager: [phone] 01/07/2017   [facility] 01/07/2017, 12:39 PM

## 2017-01-07 NOTE — Progress Notes (Signed)
Patient ID: Terry Baxter, male   DOB: March 25, 1946, 71 y.o.   MRN: [id number]  Progress Note: Metabolic and Bariatric Surgery Service   Chief Complaint/Subjective: No nausea. Ambulated in hall once. Took in several ounces of water. 1 episode of incontinence  Objective: Vital signs in last 24 hours: Temp:  [97.5 F (36.4 C)-99.6 F (37.6 C)] 98.5 F (36.9 C) (06/13 0547) Pulse Rate:  [64-110] 66 (06/13 0547) Resp:  [15-18] 18 (06/13 0547) BP: (114-151)/(62-92) 114/62 (06/13 0547) SpO2:  [98 %-100 %] 100 % (06/13 0547) Weight:  [127.7 kg (281 lb 9.6 oz)] 127.7 kg (281 lb 9.6 oz) (06/13 0500) Last BM Date: 01/05/17  Intake/Output from previous day: 06/12 0701 - 06/13 0700 In: 2122.1 [P.O.:120; I.V.:2002.1] Out: 1150 [Urine:1100; Blood:50] Intake/Output this shift: No intake/output data recorded.  Lungs: cta b/l  Cardiovascular: reg  Abd: soft, min TTP, dressing c/d/i; no guarding  Extremities: no edema, +SCDs  Neuro: approp, baseline, not ill appearing, nontoxic  Lab Results: CBC   Recent Labs  01/06/17 1027 01/07/17 0525  WBC  --  30.0*  HGB 13.5 13.1  HCT 39.1 36.9*  PLT  --  127*   BMET  Recent Labs  01/07/17 0525  NA 139  K 4.0  CL 107  CO2 23  GLUCOSE 141*  BUN 12  CREATININE 1.02  CALCIUM 8.6*   PT/INR No results for input(s): LABPROT, INR in the last 72 hours. ABG No results for input(s): PHART, HCO3 in the last 72 hours.  Invalid input(s): PCO2, PO2  Studies/Results:  Anti-infectives: Anti-infectives    Start     Dose/Rate Route Frequency Ordered Stop   01/06/17 0636  cefoTEtan in Dextrose 5% (CEFOTAN) 2-2.08 GM-% IVPB    Comments:  Tamera Punt, [place]   : cabinet override      01/06/17 0636 01/06/17 1844   01/06/17 0519  cefoTEtan in Dextrose 5% (CEFOTAN) IVPB 2 g     2 g Intravenous On call to O.R. 01/06/17 0519 01/06/17 0749      Medications: Scheduled Meds: . buPROPion  300 mg Oral Q breakfast  . enoxaparin (LOVENOX) injection   30 mg Subcutaneous Q12H  . gabapentin  300 mg Oral BID  . insulin aspart  0-15 Units Subcutaneous Q4H  . pantoprazole (PROTONIX) IV  40 mg Intravenous QHS  . polyvinyl alcohol  1 drop Both Eyes TID  . [START ON 01/08/2017] protein supplement shake  2 oz Oral Q2H  . risperiDONE  1 mg Oral TID   Continuous Infusions: . dextrose 5 % and 0.45 % NaCl with KCl 20 mEq/L 125 mL/hr at 01/07/17 0658  . levETIRAcetam 500 mg (01/06/17 2204)   PRN Meds:.oxyCODONE **AND** acetaminophen, acetaminophen, diphenhydrAMINE, morphine injection, ondansetron (ZOFRAN) IV, promethazine, simethicone  Assessment/Plan: Patient Active Problem List   Diagnosis Date Noted  . Essential hypertension 01/06/2017  . Morbid obesity ([place]) 01/06/2017  . Hiatal hernia 01/06/2017  . Sepsis ([place]) 06/23/2016  . UTI (urinary tract infection) 06/23/2016  . HLD (hyperlipidemia) 02/08/2015  . Seizures ([place]) 02/08/2015  . Type 2 diabetes mellitus with other circulatory complications ([place]) 15/17/6160  . Stroke with cerebral ischemia ([place])   . Bipolar disorder ([place]) 11/10/2014  . History of opioid abuse 11/10/2014  . Neuropathy due to secondary diabetes mellitus ([place]) 11/10/2014  . TIA (transient ischemic attack) 11/06/2014  . Syncope 10/19/2013  . Cannabis abuse 10/19/2013  . Seizure disorder ([place]) 10/19/2013  . CKD (chronic kidney disease) stage 2, GFR 60-89 ml/min 10/19/2013  .  Edema 05/05/2013  . Cardiomegaly 05/05/2013  . Hepatitis C    s/p Procedure(s): LAPAROSCOPIC GASTRIC SLEEVE RESECTION WITH UPPER ENDO,HIATAL HERNIA REPAIR 01/06/2017 Principal Problem:   Morbid obesity ([place]) Active Problems:   Seizure disorder ([place])   Bipolar disorder ([place])   HLD (hyperlipidemia)   Type 2 diabetes mellitus with other circulatory complications ([place])   Essential hypertension   Hiatal hernia  No fever, no tachycardia, and overall looks really good but wbc 30k.  Will check UGI just to make sure no leak If UGI ok, resume  postopdiet protocol PT/OT consult lovenox teaching - plan to send out on lovenox Case manager consult - home med check Will need more than 1 day of teaching in hospital  Rilyn Upshaw M. Redmond Pulling, MD, FACS General, Bariatric, & Minimally Invasive Surgery [facility] [place]   Disposition:  LOS: 1 day  The patient does not meet criteria for discharge because He has poor mobility and require further assistance to decrease thrombosis risk  Gayland Curry, MD [phone] [facility]

## 2017-01-07 NOTE — Evaluation (Signed)
Occupational Therapy Evaluation Patient Details Name: Terry Baxter MRN: [id number] DOB: Mar 03, 1946 Today's Date: 01/07/2017    History of Present Illness 71 yo male s/p gastric sleeve; PMHx:  hypertension, dyslipidemia, history of hepatitis C, diabetes mellitus type 2, bipolar disease, seizure disorder, TIA   Clinical Impression   Pt was admitted for the above sx.  He reports he was independent prior to admission.  He currently needs min A to ambulate to bathroom and for bed mobility and mod to max A for LB adls due to discomfort.  Began to educate him on AE.  Goals in acute setting are for mod I; he needs to be at this level to return home.      Follow Up Recommendations  SNF;Home health OT (vs.  Pt lives alone and must be mod I)    Equipment Recommendations  None recommended by OT (pt has a high commode)    Recommendations for Other Services       Precautions / Restrictions Precautions Precautions: Fall Restrictions Weight Bearing Restrictions: No      Mobility Bed Mobility Overal bed mobility: Needs Assistance Bed Mobility: Supine to Sit Rolling: Min assist   Supine to sit: Min assist;HOB elevated     General bed mobility comments: attempted to have pt roll to side and get up, but he was having difficulty following the cues. Raised HOB to assist him  Transfers Overall transfer level: Needs assistance Equipment used: Rolling walker (2 wheeled) Transfers: Sit to/from Stand Sit to Stand: Min assist         General transfer comment: assist to rise and stabilize. Cues for UE placement on walker    Balance Overall balance assessment: Needs assistance;History of Falls (pt denies, chart states hx of falls)   Sitting balance-Leahy Scale: Fair       Standing balance-Leahy Scale: Poor Standing balance comment: reliant on UEs for support                           ADL either performed or assessed with clinical judgement   ADL Overall ADL's : Needs  assistance/impaired     Grooming: Supervision/safety;Standing   Upper Body Bathing: Set up;Sitting   Lower Body Bathing: Minimal assistance;Sit to/from stand   Upper Body Dressing : Set up;Sitting   Lower Body Dressing: Maximal assistance;Sit to/from stand   Toilet Transfer: Minimal assistance;Ambulation;BSC;RW   Toileting- Clothing Manipulation and Hygiene: Minimal assistance;Sit to/from stand         General ADL Comments: pt limited with adls due to discomfort.  Min A for sit to stand. Educated on Public affairs consultant, which he used with mod A.       Vision         Perception     Praxis      Pertinent Vitals/Pain Pain Assessment: Faces Faces Pain Scale: Hurts little more Pain Location: stomach Pain Intervention(s): Limited activity within patient's tolerance;Monitored during session;Repositioned     Hand Dominance     Extremity/Trunk Assessment Upper Extremity Assessment Upper Extremity Assessment: Generalized weakness          Communication Communication Communication: No difficulties   Cognition Arousal/Alertness: Awake/alert Behavior During Therapy: Flat affect Overall Cognitive Status: No family/caregiver present to determine baseline cognitive functioning Area of Impairment: Following commands                       Following Commands: Follows one step commands with increased  time;Follows multi-step commands with increased time       General Comments: difficulty with cues for bed mobility   General Comments       Exercises     Shoulder Instructions      Home Living Family/patient expects to be discharged to:: Private residence Living Arrangements: Alone Available Help at Discharge: Friend(s) Type of Home: Other(Comment) (rents a room)       Home Layout: One level     Bathroom Shower/Tub: Occupational psychologist: Handicapped height (vanity next to it)     Home Equipment: Cane - single point;Walker - 2  wheels   Additional Comments: shower is small and pt grabs to wall      Prior Functioning/Environment Level of Independence: Independent with assistive device(s)                 OT Problem List: Decreased strength;Decreased activity tolerance;Impaired balance (sitting and/or standing);Decreased knowledge of use of DME or AE;Pain      OT Treatment/Interventions: Self-care/ADL training;DME and/or AE instruction;Energy conservation;Balance training;Patient/family education    OT Goals(Current goals can be found in the care plan section) Acute Rehab OT Goals Patient Stated Goal: to go to rehab OT Goal Formulation: With patient Time For Goal Achievement: 01/14/17 Potential to Achieve Goals: Good ADL Goals Pt Will Transfer to Toilet: with modified independence;ambulating (comfort height commode) Pt Will Perform Toileting - Clothing Manipulation and hygiene: with modified independence;sit to/from stand Additional ADL Goal #1: pt will gather clothes and complete adl at mod I level utilizing AE as needed  OT Frequency: Min 2X/week   Barriers to D/C:            Co-evaluation              AM-PAC PT "6 Clicks" Daily Activity     Outcome Measure Help from another person eating meals?: None Help from another person taking care of personal grooming?: A Little Help from another person toileting, which includes using toliet, bedpan, or urinal?: A Little Help from another person bathing (including washing, rinsing, drying)?: A Little Help from another person to put on and taking off regular upper body clothing?: A Little Help from another person to put on and taking off regular lower body clothing?: A Lot 6 Click Score: 18   End of Session    Activity Tolerance: Patient tolerated treatment well Patient left: in bed;with call bell/phone within reach (called nursing to set bed alarm)  OT Visit Diagnosis: Muscle weakness (generalized) (M62.81);Unsteadiness on feet (R26.81)                 Time: 0277-4128 OT Time Calculation (min): 12 min Charges:  OT General Charges $OT Visit: 1 Procedure OT Evaluation $OT Eval Low Complexity: 1 Procedure G-Codes:     Terry Baxter, OTR/L [phone] 01/07/2017  Terry Baxter 01/07/2017, 1:56 PM

## 2017-01-07 NOTE — Discharge Instructions (Signed)
How and Where to Give Subcutaneous Enoxaparin Injections Enoxaparin is an injectable medicine. It is used to help prevent blood clots from developing in your veins. Health care providers often use anticoagulants like enoxaparin to prevent clots following surgery. Enoxaparin is also used in combination with other medicines to treat blood clots and heart attacks. If blood clots are left untreated, they can be life threatening. Enoxaparin comes in single-use syringes. You inject enoxaparin through a syringe into your belly (abdomen). You should change the injection site each time you give yourself a shot. Continue the enoxaparin injections as directed by your health care provider. Your health care provider will use blood clotting test results to decide when you can safely stop using enoxaparin injections. If your health care provider prescribes any additional medicines, use the medicines exactly as directed. How do I inject enoxaparin? 1. Wash your hands with soap and water. 2. Clean the selected injection site as directed by your health care provider. 3. Remove the needle cap by pulling it straight off the syringe. 4. When using a prefilled syringe, do not push the air bubble out of the syringe before the injection. The air bubble will help you get all of the medicine out of the syringe. 5. Hold the syringe like a pencil using your writing hand. 6. Use your other hand to pinch and hold an inch of the cleansed skin. 7. Insert the entire needle straight down into the fold of skin. 8. Push the plunger with your thumb until the syringe is empty. 9. Pull the needle straight out of your skin. 10. Enoxaparin injection prefilled syringes and graduated prefilled syringes are available with a system that shields the needle after injection. After you have completed your injection and removed the needle from your skin, firmly push down on the plunger. The protective sleeve will automatically cover the needle and you  will hear a click. The click means the needle is safely covered. 11. Place the syringe in the nearest needle box, also called a sharps container. If you do not have a sharps container, you can use a hard-sided plastic container with a secure lid, such as an empty laundry detergent bottle. What else do I need to know?  Do not use enoxaparin if: ? You have allergies to heparin or pork products. ? You have been diagnosed with a condition called thrombocytopenia.  Do not use the syringe or needle more than one time.  Use medicines only as directed by your health care provider.  Changes in medicines, supplements, diet, and illness can affect your anticoagulation therapy. Be sure to inform your health care provider of any of these changes.  It is important that you tell all of your health care providers and your dentist that you are taking an anticoagulant, especially if you are injured or plan to have any type of procedure.  While on anticoagulants, you will need to have blood tests done routinely as directed by your health care provider.  While using this medicine, avoid physical activities or sports that could result in a fall or cause injury.  Follow up with your laboratory test and health care provider appointments as directed. It is very important to keep your appointments. Not keeping appointments could result in a chronic or permanent injury, pain, or disability.  Before giving your medicine, you should make sure the injection is a clear and colorless or pale yellow solution. If your medicine becomes discolored or if there are particles in the syringe, do not use  it and notify your health care provider.  Keep your medicine safely stored at room temperature. Contact a health care provider if:  You develop any rashes on your skin.  You have large areas of bruising on your skin.  You have any worsening of the condition for which you take Enoxaparin.  You develop a fever. Get help  right away if:  You develop bleeding problems such as: ? Bleeding from the gums or nose that does not stop quickly. ? Vomiting blood or coughing up blood. ? Blood in your urine. ? Blood in your stool, or stool that has a dark, tarry, or coffee grounds appearance. ? A cut that does not stop bleeding within 10 minutes. These symptoms may represent a serious problem that is an emergency. Do not wait to see if the symptoms will go away. Get medical help right away. Call your local emergency services (911 in the [place] Do not drive yourself to the hospital. This information is not intended to replace advice given to you by your health care provider. Make sure you discuss any questions you have with your health care provider. Document Released: 05/15/2004 Document Revised: 03/20/2016 Document Reviewed: 12/29/2013 Elsevier Interactive Patient Education  2017 [place]. Enoxaparin injection What is this medicine? ENOXAPARIN (ee nox a PA rin) is used after knee, hip, or abdominal surgeries to prevent blood clotting. It is also used to treat existing blood clots in the lungs or in the veins. This medicine may be used for other purposes; ask your health care provider or pharmacist if you have questions. COMMON BRAND NAME(S): Lovenox What should I tell my health care provider before I take this medicine? They need to know if you have any of these conditions: -bleeding disorders, hemorrhage, or hemophilia -infection of the heart or heart valves -kidney or liver disease -previous stroke -prosthetic heart valve -recent surgery or delivery of a baby -ulcer in the stomach or intestine, diverticulitis, or other bowel disease -an unusual or allergic reaction to enoxaparin, heparin, pork or pork products, other medicines, foods, dyes, or preservatives -pregnant or trying to get pregnant -breast-feeding How should I use this medicine? This medicine is for injection under the skin. It is usually given by  a health-care professional. You or a family member may be trained on how to give the injections. If you are to give yourself injections, make sure you understand how to use the syringe, measure the dose if necessary, and give the injection. To avoid bruising, do not rub the site where this medicine has been injected. Do not take your medicine more often than directed. Do not stop taking except on the advice of your doctor or health care professional. Make sure you receive a puncture-resistant container to dispose of the needles and syringes once you have finished with them. Do not reuse these items. Return the container to your doctor or health care professional for proper disposal. Talk to your pediatrician regarding the use of this medicine in children. Special care may be needed. Overdosage: If you think you have taken too much of this medicine contact a poison control center or emergency room at once. NOTE: This medicine is only for you. Do not share this medicine with others. What if I miss a dose? If you miss a dose, take it as soon as you can. If it is almost time for your next dose, take only that dose. Do not take double or extra doses. What may interact with this medicine? -aspirin and aspirin-like  medicines -certain medicines that treat or prevent blood clots -dipyridamole -NSAIDs, medicines for pain and inflammation, like ibuprofen or naproxen This list may not describe all possible interactions. Give your health care provider a list of all the medicines, herbs, non-prescription drugs, or dietary supplements you use. Also tell them if you smoke, drink alcohol, or use illegal drugs. Some items may interact with your medicine. What should I watch for while using this medicine? Visit your doctor or health care professional for regular checks on your progress. Your condition will be monitored carefully while you are receiving this medicine. Notify your doctor or health care professional and  seek emergency treatment if you develop breathing problems; changes in vision; chest pain; severe, sudden headache; pain, swelling, warmth in the leg; trouble speaking; sudden numbness or weakness of the face, arm, or leg. These can be signs that your condition has gotten worse. If you are going to have surgery, tell your doctor or health care professional that you are taking this medicine. Do not stop taking this medicine without first talking to your doctor. Be sure to refill your prescription before you run out of medicine. Avoid sports and activities that might cause injury while you are using this medicine. Severe falls or injuries can cause unseen bleeding. Be careful when using sharp tools or knives. Consider using an Copy. Take special care brushing or flossing your teeth. Report any injuries, bruising, or red spots on the skin to your doctor or health care professional. What side effects may I notice from receiving this medicine? Side effects that you should report to your doctor or health care professional as soon as possible: -allergic reactions like skin rash, itching or hives, swelling of the face, lips, or tongue -feeling faint or lightheaded, falls -signs and symptoms of bleeding such as bloody or black, tarry stools; red or dark-brown urine; spitting up blood or brown material that looks like coffee grounds; red spots on the skin; unusual bruising or bleeding from the eye, gums, or nose Side effects that usually do not require medical attention (report to your doctor or health care professional if they continue or are bothersome): -pain, redness, or irritation at site where injected This list may not describe all possible side effects. Call your doctor for medical advice about side effects. You may report side effects to FDA at 1-800-FDA-1088. Where should I keep my medicine? Keep out of the reach of children. Store at room temperature between 15 and 30 degrees C (59 and 86  degrees F). Do not freeze. If your injections have been specially prepared, you may need to store them in the refrigerator. Ask your pharmacist. Throw away any unused medicine after the expiration date. NOTE: This sheet is a summary. It may not cover all possible information. If you have questions about this medicine, talk to your doctor, pharmacist, or health care provider.  2018 Elsevier/Gold Standard (2013-11-15 16:06:21)     GASTRIC BYPASS/SLEEVE  Home Care Instructions   These instructions are to help you care for yourself when you go home.  Call: If you have any problems.  Call [phone] and ask for the surgeon on call  If you need immediate assistance come to the ER at [facility]. Tell the ER staff you are a new post-op gastric bypass or gastric sleeve patient  Signs and symptoms to report:  Severe  vomiting or nausea o If you cannot handle clear liquids for longer than 1 day, call your surgeon  Abdominal pain which  does not get better after taking your pain medication  Fever greater than 100.4  F and chills  Heart rate over 100 beats a minute  Trouble breathing  Chest pain  Redness,  swelling, drainage, or foul odor at incision (surgical) sites  If your incisions open or pull apart  Swelling or pain in calf (lower leg)  Diarrhea (Loose bowel movements that happen often), frequent watery, uncontrolled bowel movements  Constipation, (no bowel movements for 3 days) if this happens: o Take Milk of Magnesia, 2 tablespoons by mouth, 3 times a day for 2 days if needed o Stop taking Milk of Magnesia once you have had a bowel movement o Call your doctor if constipation continues Or o Take Miralax  (instead of Milk of Magnesia) following the label instructions o Stop taking Miralax once you have had a bowel movement o Call your doctor if constipation continues  Anything you think is abnormal for you   Normal side effects after surgery:  Unable to sleep at  night or unable to concentrate  Irritability  Being tearful (crying) or depressed  These are common complaints, possibly related to your anesthesia, stress of surgery, and change in lifestyle, that usually go away a few weeks after surgery. If these feelings continue, call your medical doctor.  Wound Care: You may have surgical glue, steri-strips, or staples over your incisions after surgery  Surgical glue: Looks like clear film over your incisions and will wear off a little at a time  Steri-strips: Adhesive strips of tape over your incisions. You may notice a yellowish color on skin under the steri-strips. This is used to make the steri-strips stick better. Do not pull the steri-strips off - let them fall off  Staples: Staples may be removed before you leave the hospital o If you go home with staples, call [facility] for an appointment with your surgeons nurse to have staples removed 10 days after surgery, [phone]  Showering: You may shower two (2) days after your surgery unless your surgeon tells you differently o Wash gently around incisions with warm soapy water, rinse well, and gently pat dry o If you have a drain (tube from your incision), you may need someone to hold this while you shower o No tub baths until staples are removed and incisions are healed   Medications:  Medications should be liquid or crushed if larger than the size of a dime  Extended release pills (medication that releases a little bit at a time through the  day) should not be crushed  Depending on the size and number of medications you take, you may need to space (take a few throughout the day)/change the time you take your medications so that you do not over-fill your pouch (smaller stomach)  Make sure you follow-up with you primary care physician to make medication changes needed during rapid weight loss and life -style changes  If you have diabetes, follow up with your doctor that  orders your diabetes medication(s) within one week after surgery and check your blood sugar regularly   Do not drive while taking narcotics (pain medications)   Do not take acetaminophen (Tylenol) and Roxicet or Lortab Elixir at the same time since these pain medications contain acetaminophen   Diet:  First 2 Weeks You will see the nutritionist about two (2) weeks after your surgery. The nutritionist will increase the types of foods you can eat if you are handling liquids well:  If you have severe  vomiting or nausea and cannot handle clear liquids lasting longer than 1 day call your surgeon Protein Shake  Drink at least 2 ounces of shake 5-6 times per day  Each serving of protein shakes (usually 8-12 ounces) should have a minimum of: o 15 grams of protein o And no more than 5 grams of carbohydrate  Goal for protein each day: o Men = 80 grams per day o Women = 60 grams per day     Protein powder may be added to fluids such as non-fat milk or Lactaid milk or Soy milk (limit to 35 grams added protein powder per serving)  Hydration  Slowly increase the amount of water and other clear liquids as tolerated (See Acceptable Fluids)  Slowly increase the amount of protein shake as tolerated  Sip fluids slowly and throughout the day  May use sugar substitutes in small amounts (no more than 6-8 packets per day; i.e. Splenda)  Fluid Goal  The first goal is to drink at least 8 ounces of protein shake/drink per day (or as directed by the nutritionist); some examples of protein shakes are Johnson & Johnson, AMR Corporation, EAS Edge HP, and Unjury. - See handout from pre-op Bariatric Education Class: o Slowly increase the amount of protein shake you drink as tolerated o You may find it easier to slowly sip shakes throughout the day o It is important to get your proteins in first  Your fluid goal is to drink 64-100 ounces of fluid daily o It may take a few weeks to build up to this   32  oz. (or more) should be clear liquids And  32 oz. (or more) should be full liquids (see below for examples)  Liquids should not contain sugar, caffeine, or carbonation  Clear Liquids:  Water of Sugar-free flavored water (i.e. Fruit HO, Propel)  Decaffeinated coffee or tea (sugar-free)  Crystal lite, Wylers Lite, Minute Maid Lite  Sugar-free Jell-O  Bouillon or broth  Sugar-free Popsicle:    - Less than 20 calories each; Limit 1 per day  Full Liquids:                   Protein Shakes/Drinks + 2 choices per day of other full liquids  Full liquids must be: o No More Than 12 grams of Carbs per serving o No More Than 3 grams of Fat per serving  Strained low-fat cream soup  Non-Fat milk  Fat-free Lactaid Milk  Sugar-free yogurt (Dannon Lite & Fit, Greek yogurt)    Vitamins and Minerals  Start 1 day after surgery unless otherwise directed by your surgeon  2 Chewable Bariatric approved Multivitamin / Multimineral Supplement with iron  Chewable Calcium Citrate with Vitamin D-3 (Example: 3 Chewable Calcium  Plus 600 with Vitamin D-3) o Take 500 mg three (3) times a day for a total of 1500 mg each day o Do not take all 3 doses of calcium at one time as it may cause constipation, and you can only absorb 500 mg at a time o Do not mix multivitamins containing iron with calcium supplements;  take 2 hours apart  Menstruating women and those at risk for anemia ( a blood disease that causes weakness) may need extra iron o Talk to your doctor to see if you need more iron  If you need extra iron: Total daily Iron recommendation (including Vitamins) is 50 to 100 mg Iron/day  Do not stop taking or change any vitamins or minerals until you talk to  your nutritionist or surgeon  Your nutritionist and/or surgeon must approve all vitamin and mineral supplements   Activity and Exercise: It is important to continue walking at home. Limit your physical activity as instructed by your  doctor. During this time, use these guidelines:  Do not lift anything greater than ten  (10) pounds for at least two (2) weeks  Do not go back to work or drive until Engineer, production says you can  You may have sex when you feel comfortable o It is VERY important for male patients to use a reliable birth control method; fertility often increase after surgery o Do not get pregnant for at least 18 months  Start exercising as soon as your doctor tells you that you can o Make sure your doctor approves any physical activity  Start with a simple walking program  Walk 5-15 minutes each day, 7 days per week  Slowly increase until you are walking 30-45 minutes per day  Consider joining our [place] program. [phone] or email [email or web address]   Special Instructions Things to remember:  Free counseling is available for you and your family through collaboration between [facility] and [place]. Please call [phone] and leave a message  Use your CPAP when sleeping if this applies to you  Consider buying a medical alert bracelet that says you had lap-band surgery     You will likely have your first fill (fluid added to your band) 6 - 8 weeks after surgery  [facility] has a free Bariatric Surgery Support Group that meets monthly, the 3rd Thursday, [place]. You can see classes online at [email or web address]  It is very important to keep all follow up appointments with your surgeon, nutritionist, primary care physician, and behavioral health practitioner o After the first year, please follow up with your bariatric surgeon and nutritionist at least once a year in order to maintain best weight loss results                    [facility]:  [place]: [phone]               Bariatric Nurse Coordinator: [phone]  Gastric Bypass/Sleeve Home Care Instructions   Rev. 08/2012                                                         Reviewed and Endorsed                                                    by [facility] Patient Education Committee, Jan, 2014

## 2017-01-07 NOTE — Progress Notes (Signed)
Pt's sister was wondering if a skilled nursing facility would be an option.  Pt is tolerating his water with no difficulty. Will continue to monitor.

## 2017-01-07 NOTE — Progress Notes (Signed)
Patient alert and oriented, Post op day 1.  Provided support and encouragement.  Encouraged pulmonary toilet and ambulation.  Tolerating water/ice.  Incidence of incontinence this am.  Helped to chair.  All questions answered.  Will continue to monitor.

## 2017-01-07 NOTE — Plan of Care (Signed)
Problem: Food- and Nutrition-Related Knowledge Deficit (NB-1.1) Goal: Nutrition education Formal process to instruct or train a patient/client in a skill or to impart knowledge to help patients/clients voluntarily manage or modify food choices and eating behavior to maintain or improve health. Outcome: Completed/Met Date Met: 01/07/17 Nutrition Education Note  Received consult for diet education per DROP protocol. Patient will need to buy vitamins after discharge, states he does not have vitamins at home and did not know what vitamins he needed to take at all. RD reviewed vitamin and mineral recommendations and options with patient. Would review again in discharge education.  Discussed 2 week post op diet with pt. Emphasized that liquids must be non carbonated, non caffeinated, and sugar free. Fluid goals discussed. Pt to follow up with outpatient bariatric RD for further diet progression after 2 weeks. Multivitamins and minerals also reviewed. Teach back method used, pt expressed understanding, expect good compliance.   Diet: First 2 Weeks  You will see the nutritionist about two (2) weeks after your surgery. The nutritionist will increase the types of foods you can eat if you are handling liquids well:  If you have severe vomiting or nausea and cannot handle clear liquids lasting longer than 1 day, call your surgeon  Protein Shake  Drink at least 2 ounces of shake 5-6 times per day  Each serving of protein shakes (usually 8 - 12 ounces) should have a minimum of:  15 grams of protein  And no more than 5 grams of carbohydrate  Goal for protein each day:  Men = 80 grams per day  Women = 60 grams per day  Protein powder may be added to fluids such as non-fat milk or Lactaid milk or Soy milk (limit to 35 grams added protein powder per serving)   Hydration  Slowly increase the amount of water and other clear liquids as tolerated (See Acceptable Fluids)  Slowly increase the amount of protein  shake as tolerated  Sip fluids slowly and throughout the day  May use sugar substitutes in small amounts (no more than 6 - 8 packets per day; i.e. Splenda)   Fluid Goal  The first goal is to drink at least 8 ounces of protein shake/drink per day (or as directed by the nutritionist); some examples of protein shakes are Premier Protein, Johnson & Johnson, AMR Corporation, EAS Edge HP, and Unjury. See handout from pre-op Bariatric Education Class:  Slowly increase the amount of protein shake you drink as tolerated  You may find it easier to slowly sip shakes throughout the day  It is important to get your proteins in first  Your fluid goal is to drink 64 - 100 ounces of fluid daily  It may take a few weeks to build up to this  32 oz (or more) should be clear liquids  And  32 oz (or more) should be full liquids (see below for examples)  Liquids should not contain sugar, caffeine, or carbonation   Clear Liquids:  Water or Sugar-free flavored water (i.e. Fruit H2O, Propel)  Decaffeinated coffee or tea (sugar-free)  Crystal Lite, Wyler's Lite, Minute Maid Lite  Sugar-free Jell-O  Bouillon or broth  Sugar-free Popsicle: *Less than 20 calories each; Limit 1 per day   Full Liquids:  Protein Shakes/Drinks + 2 choices per day of other full liquids  Full liquids must be:  No More Than 12 grams of Carbs per serving  No More Than 3 grams of Fat per serving  Strained low-fat cream soup  Non-Fat milk  Fat-free Lactaid Milk  Sugar-free yogurt (Dannon Lite & Fit, [place] yogurt, Oikos Zero)   Clayton Bibles, MS, RD, LDN Pager: [phone] After Hours Pager: [phone]

## 2017-01-08 LAB — CBC WITH DIFFERENTIAL/PLATELET
BASOS ABS: 0 10*3/uL (ref 0.0–0.1)
Basophils Relative: 0 %
EOS ABS: 0.1 10*3/uL (ref 0.0–0.7)
Eosinophils Relative: 0 %
HCT: 36.7 % — ABNORMAL LOW (ref 39.0–52.0)
HEMOGLOBIN: 12.5 g/dL — AB (ref 13.0–17.0)
LYMPHS ABS: 1.4 10*3/uL (ref 0.7–4.0)
LYMPHS PCT: 7 %
MCH: 31.3 pg (ref 26.0–34.0)
MCHC: 34.1 g/dL (ref 30.0–36.0)
MCV: 91.8 fL (ref 78.0–100.0)
Monocytes Absolute: 1.4 10*3/uL — ABNORMAL HIGH (ref 0.1–1.0)
Monocytes Relative: 7 %
NEUTROS PCT: 86 %
Neutro Abs: 17 10*3/uL — ABNORMAL HIGH (ref 1.7–7.7)
Platelets: 137 10*3/uL — ABNORMAL LOW (ref 150–400)
RBC: 4 MIL/uL — AB (ref 4.22–5.81)
RDW: 13.6 % (ref 11.5–15.5)
WBC: 19.8 10*3/uL — AB (ref 4.0–10.5)

## 2017-01-08 LAB — GLUCOSE, CAPILLARY
GLUCOSE-CAPILLARY: 118 mg/dL — AB (ref 65–99)
GLUCOSE-CAPILLARY: 120 mg/dL — AB (ref 65–99)
Glucose-Capillary: 110 mg/dL — ABNORMAL HIGH (ref 65–99)
Glucose-Capillary: 113 mg/dL — ABNORMAL HIGH (ref 65–99)
Glucose-Capillary: 118 mg/dL — ABNORMAL HIGH (ref 65–99)
Glucose-Capillary: 124 mg/dL — ABNORMAL HIGH (ref 65–99)
Glucose-Capillary: 130 mg/dL — ABNORMAL HIGH (ref 65–99)

## 2017-01-08 LAB — CBC AND DIFFERENTIAL: WBC: 19.8

## 2017-01-08 NOTE — Progress Notes (Signed)
Discussed diet plan for next 2 weeks with patient and sister at bedside.  Diet reinforcement needs identified.  Patient currently drinking protein, but needs reinforcement to include other clear fluids along with protein shake.  Explained the need for both.  Will continue to reinforce education throughout stay.

## 2017-01-08 NOTE — Progress Notes (Signed)
Patient alert and oriented, Post op day 2.  Provided support and encouragement.  Encouraged pulmonary toilet, ambulation and small sips of liquids.  Started protein last evening. 4 ounces documented of protein.  Patient with episode of incontinence this am. No family at bedside at this time.  All questions answered.  Will continue to monitor.

## 2017-01-08 NOTE — Care Management Note (Signed)
Case Management Note  Patient Details  Name: Terry Baxter MRN: [id number] Date of Birth: 05-19-1946  Subjective/Objective:71 y/o m admitted w/Morbid obesity. POD#2 lap sleeve gastrectomy. Referral for med assist. PT/OT-recc SNF-CSW following for SNF-facility will manage meds.                     Action/Plan:d/c SNF.   Expected Discharge Date:  01/07/17               Expected Discharge Plan:  Skilled Nursing Facility  In-House Referral:  Clinical Social Work  Discharge planning Services  CM Consult  Post Acute Care Choice:    Choice offered to:     DME Arranged:    DME Agency:     HH Arranged:    [place] Agency:     Status of Service:  In process, will continue to follow  If discussed at Long Length of Stay Meetings, dates discussed:    Additional Comments:  Dessa Phi, RN 01/08/2017, 10:42 AM

## 2017-01-08 NOTE — Progress Notes (Signed)
Occupational Therapy Treatment Patient Details Name: Terry Baxter MRN: [id number] DOB: Nov 08, 1945 Today's Date: 01/08/2017    History of present illness 71 yo male s/p gastric sleeve; PMHx:  hypertension, dyslipidemia, history of hepatitis C, diabetes mellitus type 2, bipolar disease, seizure disorder, TIA   OT comments  Pt is progressing with OT.  He lives alone and needs min A for bed mobility and min guard when ambulating to retrieve ADL items.    Follow Up Recommendations  SNF;Home health OT (would benefit from snf if he qualifies.)  Pt lives alone    Equipment Recommendations  None recommended by OT    Recommendations for Other Services      Precautions / Restrictions Precautions Precautions: Fall Restrictions Weight Bearing Restrictions: No       Mobility Bed Mobility     Rolling: Supervision Sidelying to sit: Min assist     Sit to sidelying: Supervision General bed mobility comments: light min A to get up from bed  Transfers   Equipment used: Rolling walker (2 wheeled)   Sit to Stand: Min guard         General transfer comment: cues for safety, UE placement    Balance                                           ADL either performed or assessed with clinical judgement   ADL       Grooming: Supervision/safety;Standing                                 General ADL Comments: worked on retrieving clothes with RW.  Min guard for safety.  Pt a little unsteady but no LOB.  Educated on use of reacher to decrease strain and increased safety with retrieval. Worked on getting up from flat bed. Pt doesn't have support when he returns home.  Recommended he sponge bathe as he shares the shower and it doesn't have a seat.  Pt has been incontinent during the night per nursing.  He states that he usually uses depends at night     Vision       Perception     Praxis      Cognition Arousal/Alertness: Awake/alert Behavior During  Therapy: Flat affect Overall Cognitive Status: No family/caregiver present to determine baseline cognitive functioning                                 General Comments: cues for safety        Exercises     Shoulder Instructions       General Comments      Pertinent Vitals/ Pain       Pain Assessment: Faces Faces Pain Scale: Hurts a little bit Pain Location: stomach Pain Intervention(s): Limited activity within patient's tolerance;Monitored during session;Repositioned  Home Living                                          Prior Functioning/Environment              Frequency  Min 2X/week        Progress Toward Goals  OT Goals(current goals  can now be found in the care plan section)  Progress towards OT goals: Progressing toward goals     Plan      Co-evaluation                 AM-PAC PT "6 Clicks" Daily Activity     Outcome Measure   Help from another person eating meals?: None Help from another person taking care of personal grooming?: A Little Help from another person toileting, which includes using toliet, bedpan, or urinal?: A Little Help from another person bathing (including washing, rinsing, drying)?: A Little Help from another person to put on and taking off regular upper body clothing?: A Little Help from another person to put on and taking off regular lower body clothing?: A Lot 6 Click Score: 18    End of Session    OT Visit Diagnosis: Muscle weakness (generalized) (M62.81);Unsteadiness on feet (R26.81)   Activity Tolerance Patient tolerated treatment well   Patient Left in chair;with call bell/phone within reach;with chair alarm set   Nurse Communication          Time: 3343-5686 OT Time Calculation (min): 18 min  Charges: OT General Charges $OT Visit: 1 Procedure OT Treatments $Self Care/Home Management : 8-22 mins  Lesle Chris,  OTR/L [phone] 01/08/2017   [place] 01/08/2017, 8:18 AM

## 2017-01-08 NOTE — Clinical Social Work Note (Signed)
Clinical Social Work Assessment  Patient Details  Name: Terry Baxter MRN: [id number] Date of Birth: 1945/08/10  Date of referral:  01/08/17               Reason for consult:  Discharge Planning                Permission sought to share information with:  Facility Art therapist granted to share information::  Yes, Verbal Permission Granted  Name::        Agency::     Relationship::     Contact Information:     Housing/Transportation Living arrangements for the past 2 months:  Apartment Source of Information:  Patient Patient Interpreter Needed:  None Criminal Activity/Legal Involvement Pertinent to Current Situation/Hospitalization:  No - Comment as needed Significant Relationships:  Siblings Lives with:  Self Do you feel safe going back to the place where you live?  No (SNF recommended) Need for family participation in patient care:  No (Coment)  Care giving concerns:  Pt requires more assistance than is available at home following hospital dc.   Social Worker assessment / plan: Pt hospitalized on 01/06/17 for pre planned bariatric. CSW consulted for SNF placement. Pt is progressing slowly and PT has recommended ST Rehab. CSW Met with pt / sister this am to review PT recommendations. Pt / family are in agreement with SNF placement and SNF search has been initiated. Bed offers are pending. CSW will continue to follow to assist with d/c planning needs.  Employment status:  Retired Forensic scientist:  Medicare PT Recommendations:    Information / Referral to community resources:  [place]  Patient/Family's Response to care:  Pt / family feel SNF is needed. Patient/Family's Understanding of and Emotional Response to Diagnosis, Current Treatment, and Prognosis:  Pt is aware of his medical status. Pt / family are uncomfortable with pt returning home after surgery and feel ST Rehab placement is needed. Pt / family appreciate CSW assistance with d/c  planning.  Emotional Assessment Appearance:  Appears stated age Attitude/Demeanor/Rapport:  Other (cooperative) Affect (typically observed):  Appropriate, Calm Orientation:  Oriented to Self, Oriented to Place, Oriented to  Time, Oriented to Situation Alcohol / Substance use:  Not Applicable Psych involvement (Current and /or in the community):  No (Comment)  Discharge Needs  Concerns to be addressed:  Discharge Planning Concerns Readmission within the last 30 days:  No Current discharge risk:  None Barriers to Discharge:  No Barriers Identified   Loraine Maple  [phone] 01/08/2017, 12:51 PM

## 2017-01-08 NOTE — Progress Notes (Signed)
Patient ID: Terry Baxter, male   DOB: [birth year], 71 y.o.   MRN: [id number]  Progress Note: Metabolic and Bariatric Surgery Service   Chief Complaint/Subjective: Doing well tolerating shakes but drinking slowly. Working with PT. Working on education  Objective: Vital signs in last 24 hours: Temp:  [98.2 F (36.8 C)-99.5 F (37.5 C)] 98.2 F (36.8 C) (06/14 0407) Pulse Rate:  [57-67] 61 (06/14 0407) Resp:  [18] 18 (06/14 0407) BP: (131-146)/(61-80) 146/80 (06/14 0407) SpO2:  [94 %-100 %] 97 % (06/14 0407) Weight:  [123.9 kg (273 lb 1.6 oz)] 123.9 kg (273 lb 1.6 oz) (06/14 0407) Last BM Date: 01/05/17  Intake/Output from previous day: 06/13 0701 - 06/14 0700 In: 550 [P.O.:450; IV Piggyback:100] Out: 1430 [Urine:1430] Intake/Output this shift: Total I/O In: 750 [P.O.:150; I.V.:500; IV Piggyback:100] Out: 250 [Urine:250]  Lungs: cta, unlabored  Cardiovascular: reg  Abd: soft, obese, incisions c/d/i, min TTP  Extremities: no edema  Neuro:  approp  Lab Results: CBC   Recent Labs  01/07/17 0525 01/07/17 1551 01/08/17 0511  WBC 30.0*  --  19.8*  HGB 13.1 12.7* 12.5*  HCT 36.9* 35.6* 36.7*  PLT 127*  --  137*   BMET  Recent Labs  01/07/17 0525  NA 139  K 4.0  CL 107  CO2 23  GLUCOSE 141*  BUN 12  CREATININE 1.02  CALCIUM 8.6*   PT/INR No results for input(s): LABPROT, INR in the last 72 hours. ABG No results for input(s): PHART, HCO3 in the last 72 hours.  Invalid input(s): PCO2, PO2  Studies/Results:  Anti-infectives: Anti-infectives    Start     Dose/Rate Route Frequency Ordered Stop   01/06/17 0636  cefoTEtan in Dextrose 5% (CEFOTAN) 2-2.08 GM-% IVPB    Comments:  Tamera Punt, Loraine   : cabinet override      01/06/17 0636 01/06/17 1844   01/06/17 0519  cefoTEtan in Dextrose 5% (CEFOTAN) IVPB 2 g     2 g Intravenous On call to O.R. 01/06/17 0519 01/06/17 0749      Medications: Scheduled Meds: . buPROPion  300 mg Oral Q breakfast  .  enoxaparin (LOVENOX) injection  30 mg Subcutaneous Q12H  . gabapentin  300 mg Oral BID  . insulin aspart  0-15 Units Subcutaneous Q4H  . pantoprazole (PROTONIX) IV  40 mg Intravenous QHS  . polyvinyl alcohol  1 drop Both Eyes TID  . protein supplement shake  2 oz Oral Q2H  . risperiDONE  1 mg Oral TID   Continuous Infusions: . dextrose 5 % and 0.45 % NaCl with KCl 20 mEq/L 125 mL/hr at 01/08/17 4098  . levETIRAcetam Stopped (01/08/17 1034)   PRN Meds:.oxyCODONE **AND** acetaminophen, acetaminophen, diphenhydrAMINE, morphine injection, ondansetron (ZOFRAN) IV, promethazine, simethicone  Assessment/Plan: Patient Active Problem List   Diagnosis Date Noted  . Essential hypertension 01/06/2017  . Morbid obesity ([place]) 01/06/2017  . Hiatal hernia 01/06/2017  . Sepsis ([place]) 06/23/2016  . UTI (urinary tract infection) 06/23/2016  . HLD (hyperlipidemia) 02/08/2015  . Seizures ([place]) 02/08/2015  . Type 2 diabetes mellitus with other circulatory complications ([place]) 11/91/4782  . Stroke with cerebral ischemia ([place])   . Bipolar disorder ([place]) 11/10/2014  . History of opioid abuse 11/10/2014  . Neuropathy due to secondary diabetes mellitus ([place]) 11/10/2014  . TIA (transient ischemic attack) 11/06/2014  . Syncope 10/19/2013  . Cannabis abuse 10/19/2013  . Seizure disorder ([place]) 10/19/2013  . CKD (chronic kidney disease) stage 2, GFR 60-89 ml/min 10/19/2013  .  Edema 05/05/2013  . Cardiomegaly 05/05/2013  . Hepatitis C    s/p Procedure(s): LAPAROSCOPIC GASTRIC SLEEVE RESECTION WITH UPPER ENDO,HIATAL HERNIA REPAIR 01/06/2017  No leak on swallow test yesterday. Afebrile. Not tachycardic. Looks good We'll continue postoperative diet Needs ongoing dietary teaching as well as dietary supervision. Also needs ongoing Lovenox teaching and supervision. The patient is not able to do self injection. He lives alone We will plan to send him to skilled nursing facility temporarily for ongoing dietary  support and Lovenox management Discussed with sister Continue chemical DVT prophylaxis  Disposition:  LOS: 2 days  The patient does not meet criteria for discharge because He needs ongoing education as well as supervision    Gayland Curry, MD [phone] [facility]

## 2017-01-08 NOTE — NC FL2 (Signed)
[place] LEVEL OF CARE SCREENING TOOL     IDENTIFICATION  Patient Name: Terry Baxter Birthdate: 08-30-1945 Sex: male Admission Date (Current Location): 01/06/2017  [place] and [place] Number:  Herbalist and Address:  [facility],  [place], [place]      Provider Number: [id number]  Attending Physician Name and Address:  Greer Pickerel, MD  Relative Name and Phone Number:       Current Level of Care: Hospital Recommended Level of Care: [place] Prior Approval Number:    Date Approved/Denied:   PASRR Number: [id number] Discharge Plan: SNF    Current Diagnoses: Patient Active Problem List   Diagnosis Date Noted  . Essential hypertension 01/06/2017  . Morbid obesity ([place]) 01/06/2017  . Hiatal hernia 01/06/2017  . Sepsis ([place]) 06/23/2016  . UTI (urinary tract infection) 06/23/2016  . HLD (hyperlipidemia) 02/08/2015  . Seizures ([place]) 02/08/2015  . Type 2 diabetes mellitus with other circulatory complications ([place]) 92/05/9416  . Stroke with cerebral ischemia ([place])   . Bipolar disorder ([place]) 11/10/2014  . History of opioid abuse 11/10/2014  . Neuropathy due to secondary diabetes mellitus ([place]) 11/10/2014  . TIA (transient ischemic attack) 11/06/2014  . Syncope 10/19/2013  . Cannabis abuse 10/19/2013  . Seizure disorder ([place]) 10/19/2013  . CKD (chronic kidney disease) stage 2, GFR 60-89 ml/min 10/19/2013  . Edema 05/05/2013  . Cardiomegaly 05/05/2013  . Hepatitis C     Orientation RESPIRATION BLADDER Height & Weight     Self, Time, Situation, Place  Normal Incontinent Weight: 273 lb 1.6 oz (123.9 kg) Height:  5\' 8"  (172.7 cm)  BEHAVIORAL SYMPTOMS/MOOD NEUROLOGICAL BOWEL NUTRITION STATUS  Other (Comment) (no behaviors) Seizure d/o Continent Diet (bariatric diet)  AMBULATORY STATUS COMMUNICATION OF NEEDS Skin   Limited Assist Verbally Surgical wounds                       Personal Care  Assistance Level of Assistance  Bathing, Feeding, Dressing Bathing Assistance: Limited assistance Feeding assistance: Independent Dressing Assistance: Limited assistance     Functional Limitations Info  Sight, Hearing, Speech Sight Info: Adequate Hearing Info: Adequate Speech Info: Adequate    SPECIAL CARE FACTORS FREQUENCY  PT (By licensed PT), OT (By licensed OT)     PT Frequency: 5x wk OT Frequency: 5x wk            Contractures Contractures Info: Not present    Additional Factors Info  Code Status, Psychotropic, Allergies Code Status Info: Full Code             Current Medications (01/08/2017):  This is the current hospital active medication list Current Facility-Administered Medications  Medication Dose Route Frequency Provider Last Rate Last Dose  . oxyCODONE (ROXICODONE) 5 MG/5ML solution 5-10 mg  5-10 mg Oral Q4H PRN Greer Pickerel, MD   5 mg at 01/07/17 1136   And  . acetaminophen (TYLENOL) solution 325-650 mg  325-650 mg Oral Q4H PRN Greer Pickerel, MD   650 mg at 01/08/17 0414  . acetaminophen (TYLENOL) tablet 650 mg  650 mg Oral Q4H PRN Greer Pickerel, MD      . buPROPion (WELLBUTRIN XL) 24 hr tablet 300 mg  300 mg Oral Q breakfast Greer Pickerel, MD   300 mg at 01/08/17 0820  . dextrose 5 % and 0.45 % NaCl with KCl 20 mEq/L infusion   Intravenous Continuous Greer Pickerel, MD 125 mL/hr at 01/08/17 [phone]    .  diphenhydrAMINE (BENADRYL) injection 12.5 mg  12.5 mg Intravenous Q8H PRN Greer Pickerel, MD      . enoxaparin (LOVENOX) injection 30 mg  30 mg Subcutaneous Q12H Greer Pickerel, MD   30 mg at 01/08/17 1019  . gabapentin (NEURONTIN) capsule 300 mg  300 mg Oral BID Greer Pickerel, MD   300 mg at 01/08/17 1019  . insulin aspart (novoLOG) injection 0-15 Units  0-15 Units Subcutaneous Q4H Greer Pickerel, MD   2 Units at 01/08/17 0415  . levETIRAcetam (KEPPRA) 500 mg in sodium chloride 0.9 % 100 mL IVPB  500 mg Intravenous Q12H Greer Pickerel, MD   Stopped at 01/08/17 1034  .  morphine 2 MG/ML injection 1-3 mg  1-3 mg Intravenous Q1H PRN Greer Pickerel, MD   2 mg at 01/07/17 9741  . ondansetron (ZOFRAN) injection 4 mg  4 mg Intravenous Q6H PRN Greer Pickerel, MD      . pantoprazole (PROTONIX) injection 40 mg  40 mg Intravenous Toy Care, MD   40 mg at 01/07/17 2112  . polyvinyl alcohol (LIQUIFILM TEARS) 1.4 % ophthalmic solution 1 drop  1 drop Both Eyes TID Greer Pickerel, MD   1 drop at 01/08/17 1019  . promethazine (PHENERGAN) injection 12.5 mg  12.5 mg Intravenous Q6H PRN Greer Pickerel, MD      . protein supplement (PREMIER PROTEIN) liquid  2 oz Oral Rolly Pancake, MD   2 oz at 01/08/17 1000  . risperiDONE (RISPERDAL) tablet 1 mg  1 mg Oral TID Greer Pickerel, MD   1 mg at 01/08/17 1019  . simethicone (MYLICON) 40 UL/8.4TX suspension 40 mg  40 mg Oral Q6H PRN Greer Pickerel, MD         Discharge Medications: Please see discharge summary for a list of discharge medications.  Relevant Imaging Results:  Relevant Lab Results:   Additional Information SS # [id number]  Venera Privott, Randall An, LCSW

## 2017-01-08 NOTE — Clinical Social Work Placement (Signed)
   CLINICAL SOCIAL WORK PLACEMENT  NOTE  Date:  01/08/2017  Patient Details  Name: Terry Baxter MRN: [id number] Date of Birth: 02/21/1946  Clinical Social Work is seeking post-discharge placement for this patient at the [place] level of care (*CSW will initial, date and re-position this form in  chart as items are completed):  Yes   Patient/family provided with [place] Work Department's list of facilities offering this level of care within the geographic area requested by the patient (or if unable, by the patient's family).  Yes   Patient/family informed of their freedom to choose among providers that offer the needed level of care, that participate in Medicare, Medicaid or managed care program needed by the patient, have an available bed and are willing to accept the patient.  Yes   Patient/family informed of [place]'s ownership interest in [facility] and [facility], as well as of the fact that they are under no obligation to receive care at these facilities.  PASRR submitted to EDS on       PASRR number received on       Existing PASRR number confirmed on 01/08/17     FL2 transmitted to all facilities in geographic area requested by pt/family on 01/08/17     FL2 transmitted to all facilities within larger geographic area on       Patient informed that his/her managed care company has contracts with or will negotiate with certain facilities, including the following:            Patient/family informed of bed offers received.  Patient chooses bed at       Physician recommends and patient chooses bed at      Patient to be transferred to   on  .  Patient to be transferred to facility by       Patient family notified on   of transfer.  Name of family member notified:        PHYSICIAN       Additional Comment:    _______________________________________________ Luretha Rued, [place] 01/08/2017, 1:28 PM

## 2017-01-08 NOTE — Progress Notes (Signed)
Spoke with Terry Baxter RD at [facility] to make sure facility would be able to accommodate Terry Baxter's needs as a post op bariatric patient.  Copy of diet faxed earlier to facility.  Patient would have to supply own protein shakes.  We discussed bariatric vitamins.  Patient brother to purchase vitamins.  Physician order required per Terry Baxter  for facility to distribute  patient home supply.  Plan to discharge tomorrow to [facility] if patient is medically stable.

## 2017-01-09 LAB — GLUCOSE, CAPILLARY
Glucose-Capillary: 125 mg/dL — ABNORMAL HIGH (ref 65–99)
Glucose-Capillary: 111 mg/dL — ABNORMAL HIGH (ref 65–99)
Glucose-Capillary: 105 mg/dL — ABNORMAL HIGH (ref 65–99)

## 2017-01-09 MED ORDER — OXYCODONE HCL 5 MG/5ML PO SOLN: 5.0000 mg | ORAL | 0 refills | Status: DC | PRN

## 2017-01-09 MED ORDER — ENOXAPARIN SODIUM 40 MG/0.4ML ~~LOC~~ SOLN: 40.0000 mg | SUBCUTANEOUS | 0 refills | Status: DC

## 2017-01-09 MED ORDER — CLONAZEPAM 0.5 MG PO TABS: 0.5000 mg | ORAL_TABLET | Freq: Three times a day (TID) | ORAL | 0 refills | Status: DC | PRN

## 2017-01-09 NOTE — Progress Notes (Signed)
CSW assisting with d/c planning. [facility] is able to admit pt today if stable for dc. CSW will assist with dc planning to SNF.  Werner Lean LCSW [phone]

## 2017-01-09 NOTE — Progress Notes (Signed)
  Discharge education provided to patient including diet, total fluid intake, vitamins, signs of symptoms to call, and BELT program.  Instructed follow up appointments will be listed on AVS for patient.  Discussed plan of care with bedside RN including patient/family providing protein shakes at [facility] and vitamins.  Patient does not currently have bariatric vitamins, per patient, brother will pick those up today. Will continue to monitor

## 2017-01-09 NOTE — Clinical Social Work Placement (Signed)
   CLINICAL SOCIAL WORK PLACEMENT  NOTE  Date:  01/09/2017  Patient Details  Name: Terry Baxter MRN: [id number] Date of Birth: 1946/06/13  Clinical Social Work is seeking post-discharge placement for this patient at the [place] level of care (*CSW will initial, date and re-position this form in  chart as items are completed):  Yes   Patient/family provided with [place] Work Department's list of facilities offering this level of care within the geographic area requested by the patient (or if unable, by the patient's family).  Yes   Patient/family informed of their freedom to choose among providers that offer the needed level of care, that participate in Medicare, Medicaid or managed care program needed by the patient, have an available bed and are willing to accept the patient.  Yes   Patient/family informed of [place]'s ownership interest in [facility] and [facility], as well as of the fact that they are under no obligation to receive care at these facilities.  PASRR submitted to EDS on       PASRR number received on       Existing PASRR number confirmed on 01/08/17     FL2 transmitted to all facilities in geographic area requested by pt/family on 01/08/17     FL2 transmitted to all facilities within larger geographic area on       Patient informed that his/her managed care company has contracts with or will negotiate with certain facilities, including the following:        Yes   Patient/family informed of bed offers received.  Patient chooses bed at [facility]     Physician recommends and patient chooses bed at      Patient to be transferred to [facility] Rehab on 01/09/17.  Patient to be transferred to facility by [place]     Patient family notified on 01/09/17 of transfer.  Name of family member notified:  Pt contacted his brother directly.     PHYSICIAN       Additional Comment: Pt / family  are in agreement with d/c to [facility] today. Pt requesting to transport by car. D/C Summary sent to SNF for review. Scripts included in d/c packet. # for report provided to nsg. D/C packet provided to pt.   _______________________________________________ Luretha Rued, LCSW 01/09/2017, 3:41 PM

## 2017-01-09 NOTE — Progress Notes (Signed)
Patient is alert and oriented, vital signs are stable, incisions are within normal limits, bowel sounds active and passing flatus, patient is tolerating his protein shakes with no nausea or vomiting,will continue to monitor Neta Mends RN 12:57 PM 01-09-2017

## 2017-01-09 NOTE — Discharge Summary (Signed)
Physician Discharge Summary  Terry Baxter RFF:[id number] DOB: 06-Oct-1945 DOA: 01/06/2017  PCP: Wallene Huh, MD  Admit date: 01/06/2017 Discharge date: 01/09/2017  Recommendations for Outpatient Follow-up:   Follow-up Information    Greer Pickerel, MD. Go on 01/21/2017.   Specialty:  General Surgery Why:  at [address] information: [address] [place] [address] [phone]        Greer Pickerel, MD Follow up.   Specialty:  General Surgery Contact information: [place] [address] [phone]          Discharge Diagnoses:  Principal Problem:   Morbid obesity ([place]) Active Problems:   Seizure disorder ([place])   Bipolar disorder ([place])   HLD (hyperlipidemia)   Type 2 diabetes mellitus with other circulatory complications [facility])   Essential hypertension   Hiatal hernia   Surgical Procedure: Laparoscopic Sleeve Gastrectomy, upper endoscopy  Discharge Condition: Good Disposition: SNF  Diet recommendation: Postoperative sleeve gastrectomy diet (liquids only)  Filed Weights   01/06/17 0534 01/07/17 0500 01/08/17 0407  Weight: 123.8 kg (273 lb) 127.7 kg (281 lb 9.6 oz) 123.9 kg (273 lb 1.6 oz)     Hospital Course:  The patient was admitted for a planned laparoscopic sleeve gastrectomy. Please see operative note. Preoperatively the patient was given 5000 units of subcutaneous heparin for DVT prophylaxis. Postoperative prophylactic Lovenox dosing was started on the morning of postoperative day 1. On the evening of postoperative day 0, the patient was started on water and ice chips. On postoperative day 1 the patient had no fever or tachycardia and was tolerating water in their diet was gradually advanced throughout the day. However his white blood cell count increased to 30,000. I ordered an upper GI which demonstrated no leak. His diet was resumed. His white count decreased. Physical therapy and occupational therapy were ordered  and they recommended skilled nursing facility. The patient needs ongoing dietary education as well as Lovenox teaching. He is not safe for discharge to home since he lives by himself. The patient was ambulating. Their vital signs are stable without fever or tachycardia. Their hemoglobin had remained stable.  The patient had received discharge instructions and counseling. They were deemed stable for discharge for SNF and had met discharge criteria  BP 131/73 (BP Location: Right Arm)   Pulse 70   Temp 98.9 F (37.2 C) (Oral)   Resp 16   Ht _0  (1.727 m)   Wt 123.9 kg (273 lb 1.6 oz)   SpO2 96%   BMI 41.52 kg/m   Gen: alert, NAD, non-toxic appearing Pupils: equal, no scleral icterus Pulm: Lungs clear to auscultation, symmetric chest rise CV: regular rate and rhythm Abd: soft, min tenderness, nondistended. . No cellulitis. No incisional hernia Ext: no edema, no calf tenderness Skin: no rash, no jaundice    Discharge Instructions  Discharge Instructions    Ambulate hourly while awake    Complete by:  As directed    Call MD for:  difficulty breathing, headache or visual disturbances    Complete by:  As directed    Call MD for:  persistant dizziness or light-headedness    Complete by:  As directed    Call MD for:  persistant nausea and vomiting    Complete by:  As directed    Call MD for:  redness, tenderness, or signs of infection (pain, swelling, redness, odor or green/yellow discharge around incision site)    Complete by:  As directed  Call MD for:  severe uncontrolled pain    Complete by:  As directed    Call MD for:  temperature >101 F    Complete by:  As directed    Diet bariatric full liquid    Complete by:  As directed    Please review dietary guidelines.   Discharge instructions    Complete by:  As directed    See bariatric discharge instructions   Incentive spirometry    Complete by:  As directed    Perform hourly while awake     Allergies as of 01/09/2017       Reactions   Tizanidine Other (See Comments)   dizziness      Medication List    STOP taking these medications   ALEVE PM 220-25 MG Tabs Generic drug:  Naproxen Sod-Diphenhydramine   aspirin 325 MG tablet   cefUROXime 250 MG tablet Commonly known as:  CEFTIN   HYDROcodone-acetaminophen 10-325 MG tablet Commonly known as:  NORCO   traMADol 50 MG tablet Commonly known as:  ULTRAM     TAKE these medications   atorvastatin 40 MG tablet Commonly known as:  LIPITOR Take 1 tablet (40 mg total) by mouth daily at 6 PM. What changed:  when to take this   buPROPion 300 MG 24 hr tablet Commonly known as:  WELLBUTRIN XL Take 300 mg by mouth daily with breakfast.   clonazePAM 0.5 MG tablet Commonly known as:  KLONOPIN Take 1 tablet (0.5 mg total) by mouth 3 (three) times daily as needed for anxiety. What changed:  when to take this  reasons to take this   doxepin 10 MG capsule Commonly known as:  SINEQUAN TAkE 2 CAPSULES BY MOUTH AT BEDTIME   gabapentin 300 MG capsule Commonly known as:  NEURONTIN Take 300 mg by mouth 2 (two) times daily.   hydrochlorothiazide 12.5 MG capsule Commonly known as:  MICROZIDE Take 12.5 mg by mouth every evening. Notes to patient:  Monitor Blood Pressure Daily and keep a log for primary care physician.  Monitor for symptoms of dehydration.  You may need to make changes to your medications with rapid weight loss.     levETIRAcetam 500 MG tablet Commonly known as:  KEPPRA Take 500 mg by mouth 2 (two) times daily.   multivitamin with minerals Tabs tablet Take 1 tablet by mouth daily.   oxyCODONE 5 MG/5ML solution Commonly known as:  ROXICODONE Take 5-10 mLs (5-10 mg total) by mouth every 4 (four) hours as needed for moderate pain or severe pain.   polyethylene glycol packet Commonly known as:  MIRALAX / GLYCOLAX Take 17 g by mouth daily as needed for mild constipation.   risperiDONE 1 MG tablet Commonly known as:  RISPERDAL Take 1  mg by mouth 3 (three) times daily.   SYSTANE OP Apply 1 drop to eye 3 (three) times daily.      Follow-up Information    Greer Pickerel, MD. Go on 01/21/2017.   Specialty:  General Surgery Why:  at [address] information: [address] [place] [address] [phone]        Greer Pickerel, MD Follow up.   Specialty:  General Surgery Contact information: [place] [address] [phone]            The results of significant diagnostics from this hospitalization (including imaging, microbiology, ancillary and laboratory) are listed below for reference.    Significant Diagnostic Studies: Dg  Ugi W/water Sol Cm  Result Date: 01/07/2017 CLINICAL DATA:  71 year old male status post gastric sleeve surgery yesterday. Elevated white blood cell count. EXAM: WATER SOLUBLE UPPER GI SERIES TECHNIQUE: Single-column upper GI series was performed using water soluble contrast. CONTRAST:  50 mL of Isovue-300 orally. COMPARISON:  Upper GI 02/21/2016. FLUOROSCOPY TIME:  Fluoroscopy Time:  2.4 minutes Radiation Exposure Index (if provided by the fluoroscopic device): 35.3 mGy FINDINGS: Preprocedural KUB demonstrates a nonobstructive bowel gas pattern. Suture line projecting over the stomach. Upon ingestion of water soluble contrast, and the esophagus was grossly normal in appearance. Contrast entered the stomach, and the stomach has a characteristic narrowed lumen typical of sleeve gastrectomy. No contrast extravasation was noted during the examination. Contrast readily traversed the pylorus and extended through the duodenum which was normal in appearance. IMPRESSION: 1. Typical findings of sleeve gastrectomy, without evidence of postoperative leak. Electronically Signed   By: Vinnie Langton M.D.   On: 01/07/2017 10:10    Labs: Basic Metabolic Panel:  Recent Labs Lab 01/02/17 1100 01/07/17 0525  NA 140 139  K 3.7 4.0  CL 106 107  CO2 24 23  GLUCOSE  95 141*  BUN 23* 12  CREATININE 1.11 1.02  CALCIUM 9.3 8.6*   Liver Function Tests:  Recent Labs Lab 01/02/17 1100 01/07/17 0525  AST 21 99*  ALT 13* 46  ALKPHOS 78 60  BILITOT 0.7 0.4  PROT 8.3* 6.8  ALBUMIN 4.1 3.2*    CBC:  Recent Labs Lab 01/02/17 1100 01/06/17 1027 01/07/17 0525 01/07/17 1551 01/08/17 0511  WBC 8.0  --  30.0*  --  19.8*  NEUTROABS 5.3  --  26.1*  --  17.0*  HGB 13.3 13.5 13.1 12.7* 12.5*  HCT 38.9* 39.1 36.9* 35.6* 36.7*  MCV 91.3  --  91.6  --  91.8  PLT 158  --  127*  --  137*    CBG:  Recent Labs Lab 01/08/17 1554 01/08/17 2003 01/08/17 2355 01/09/17 0349 01/09/17 0813  GLUCAP 118* 124* 110* 125* 111*    Principal Problem:   Morbid obesity ([place]) Active Problems:   Seizure disorder ([place])   Bipolar disorder ([place])   HLD (hyperlipidemia)   Type 2 diabetes mellitus with other circulatory complications ([place])   Essential hypertension   Hiatal hernia   Time coordinating discharge: 15 min  Signed:  Gayland Curry, MD [facility], [place] [phone] 01/09/2017, 8:34 AM

## 2017-01-09 NOTE — Care Management Note (Signed)
Case Management Note  Patient Details  Name: Terry Baxter MRN: [id number] Date of Birth: [birth year]  Subjective/Objective:                    Action/Plan:d/c SNF.   Expected Discharge Date:  01/09/17               Expected Discharge Plan:  Skilled Nursing Facility  In-House Referral:  Clinical Social Work  Discharge planning Services  CM Consult  Post Acute Care Choice:    Choice offered to:     DME Arranged:    DME Agency:     HH Arranged:    [place] Agency:     Status of Service:  Completed, signed off  If discussed at [employer], dates discussed:    Additional Comments:  Dessa Phi, RN 01/09/2017, 8:54 AM

## 2017-01-12 ENCOUNTER — Encounter: Payer: Self-pay | Admitting: Internal Medicine

## 2017-01-12 ENCOUNTER — Non-Acute Institutional Stay (SKILLED_NURSING_FACILITY): Payer: Medicare Other | Admitting: Internal Medicine

## 2017-01-12 DIAGNOSIS — Z87898 Personal history of other specified conditions: Secondary | ICD-10-CM

## 2017-01-12 DIAGNOSIS — R569 Unspecified convulsions: Secondary | ICD-10-CM

## 2017-01-12 DIAGNOSIS — E1340 Other specified diabetes mellitus with diabetic neuropathy, unspecified: Secondary | ICD-10-CM

## 2017-01-12 DIAGNOSIS — F3175 Bipolar disorder, in partial remission, most recent episode depressed: Secondary | ICD-10-CM | POA: Diagnosis not present

## 2017-01-12 DIAGNOSIS — F1111 Opioid abuse, in remission: Secondary | ICD-10-CM

## 2017-01-12 DIAGNOSIS — I10 Essential (primary) hypertension: Secondary | ICD-10-CM

## 2017-01-12 DIAGNOSIS — E1159 Type 2 diabetes mellitus with other circulatory complications: Secondary | ICD-10-CM

## 2017-01-12 NOTE — Progress Notes (Signed)
: Provider:  Noah Delaine. Sheppard Coil, MD Location:  [place] Room Number: [phone] Place of Service:  SNF ([phone])  PCP: Wallene Huh, MD Patient Care Team: Wallene Huh, MD as PCP - General (Cardiology)  Extended Emergency Contact Information Primary Emergency Contact: [facility] Address: [address]          [place], [place] [address] [place] of [place] Phone: [phone] Relation: Brother     Allergies: Tizanidine  Chief Complaint  Patient presents with  . New Admit To SNF    Admit to Facility    HPI: Patient is 71 y.o. male with bipolar dx, seizure dx, DM2, HLD and HTN who was admitted to [facility] for a planned laparoscopic sleeve gastrectomy. Patient's hospital course was compensated Y by a white blood cell count of 30,000. However in upper GI demonstrated no leak and his white count decreased. The patient will need ongoing dietary education as well as Lovenox teaching. Therefore patient is admitted to skilled nursing facility for teaching, monitoring, and physical therapy. While at skilled nursing facility patient will be followed for seizure disorder treated with Keppra, bipolar disease treated with Risperdal and Wellbutrin and hypertension treated with hydrochlorothiazide.  Past Medical History:  Diagnosis Date  . Anxiety   . Arthritis   . Bipolar 1 disorder ([place])    followed by dr headen every 2 months  . Chronic low back pain    goes to pain center  . CKD (chronic kidney disease), stage II   . DJD (degenerative joint disease)    lower back  . Hepatitis   . History of balanitis   . History of hepatitis C    per pt treated w/ medication and cured  in 2014  . History of heroin abuse    per pt stopped and on was methadone until 10-19-2012--  none since  . History of transient ischemic attack (TIA)    11-06-2014  ?tia--  MRA/ MRI normal  . Hyperlipidemia   . Hypertension   . Nocturia   . Phimosis   . Seizure disorder [facility])    neurologist-  dr Rosalin Hawking--  last seizure 2014  . Seizures ([place])    once in 2010 none since  . Type 2 diabetes, diet controlled ([place])    diet controlled  . Urinary incontinence    wear depends  . Wears dentures    upper  . Wears glasses     Past Surgical History:  Procedure Laterality Date  . BACK SURGERY  1980's   lower back  . CIRCUMCISION N/A 04/25/2016   Procedure: CIRCUMCISION ADULT;  Surgeon: Alexis Frock, MD;  Location: [facility];  Service: Urology;  Laterality: N/A;  . COLONOSCOPY  2013  . EYE SURGERY Bilateral 09/2016   ioc lens implant for cataracts  . LAPAROSCOPIC GASTRIC SLEEVE RESECTION N/A 01/06/2017   Procedure: LAPAROSCOPIC GASTRIC SLEEVE RESECTION WITH UPPER ENDO,HIATAL HERNIA REPAIR;  Surgeon: Greer Pickerel, MD;  Location: [facility];  Service: General;  Laterality: N/A;  . NEGATIVE SLEEP STUDY  2009  per pt  . surgeru for infection of sternum  1990  . TRANSTHORACIC ECHOCARDIOGRAM  11/06/2014   ef 45-50%/  trivial MR and TR    Allergies as of 01/12/2017      Reactions   Tizanidine Other (See Comments)   dizziness      Medication List       Accurate as of 01/12/17  8:41 AM. Always use your most  recent med list.          atorvastatin 40 MG tablet Commonly known as:  LIPITOR Take 1 tablet (40 mg total) by mouth daily at 6 PM.   buPROPion 300 MG 24 hr tablet Commonly known as:  WELLBUTRIN XL Take 300 mg by mouth daily with breakfast.   clonazePAM 0.5 MG tablet Commonly known as:  KLONOPIN Take 1 tablet (0.5 mg total) by mouth 3 (three) times daily as needed for anxiety.   doxepin 10 MG capsule Commonly known as:  SINEQUAN TAkE 2 CAPSULES BY MOUTH AT BEDTIME   gabapentin 300 MG capsule Commonly known as:  NEURONTIN Take 300 mg by mouth 2 (two) times daily.   hydrochlorothiazide 12.5 MG capsule Commonly known as:  MICROZIDE Take 12.5 mg by mouth every evening.   levETIRAcetam 500 MG tablet Commonly known as:  KEPPRA Take 500  mg by mouth 2 (two) times daily.   multivitamin with minerals Tabs tablet Take 1 tablet by mouth daily.   oxyCODONE-acetaminophen 5-325 MG/5ML solution Commonly known as:  ROXICET Take 5-10 mLs by mouth every 6 (six) hours as needed. Take 75mls (5 mg) for mild pain and 10 mls (10mg ) for severe pain.   polyethylene glycol packet Commonly known as:  MIRALAX / GLYCOLAX Take 17 g by mouth daily as needed for mild constipation.   risperiDONE 1 MG tablet Commonly known as:  RISPERDAL Take 1 mg by mouth 3 (three) times daily.   SYSTANE BALANCE 0.6 % Soln Generic drug:  Propylene Glycol Place 1 drop into both eyes 3 (three) times daily.       Meds ordered this encounter  Medications  . oxyCODONE-acetaminophen (ROXICET) 5-325 MG/5ML solution    Sig: Take 5-10 mLs by mouth every 6 (six) hours as needed. Take 72mls (5 mg) for mild pain and 10 mls (10mg ) for severe pain.  Marland Kitchen Propylene Glycol (SYSTANE BALANCE) 0.6 % SOLN    Sig: Place 1 drop into both eyes 3 (three) times daily.    Immunization History  Administered Date(s) Administered  . Tdap 01/03/2016    Social History  Substance Use Topics  . Smoking status: Never Smoker  . Smokeless tobacco: Never Used  . Alcohol use No    Family history is   History reviewed. No pertinent family history.    Review of Systems  DATA OBTAINED: from patient, nurse GENERAL:  no fevers, fatigue, appetite changes SKIN: No itching, or rash EYES: No eye pain, redness, discharge EARS: No earache, tinnitus, change in hearing NOSE: No congestion, drainage or bleeding  MOUTH/THROAT: No mouth or tooth pain, No sore throat RESPIRATORY: No cough, wheezing, SOB CARDIAC: No chest pain, palpitations, lower extremity edema  GI: No abdominal pain, No N/V/D or constipation, No heartburn or reflux  GU: No dysuria, frequency or urgency, or incontinence  MUSCULOSKELETAL: No unrelieved bone/joint pain NEUROLOGIC: No headache, dizziness or focal  weakness PSYCHIATRIC: No c/o anxiety or sadness   Vitals:   01/12/17 0821  BP: 118/76  Pulse: 81  Resp: 18  Temp: 97 F (36.1 C)    SpO2 Readings from Last 1 Encounters:  01/12/17 97%   Body mass index is 41.53 kg/m.     Physical Exam  GENERAL APPEARANCE: Alert, conversant,  No acute distress.  SKIN: No diaphoresis rash HEAD: Normocephalic, atraumatic  EYES: Conjunctiva/lids clear. Pupils round, reactive. EOMs intact.  EARS: External exam WNL, canals clear. Hearing grossly normal.  NOSE: No deformity or discharge.  MOUTH/THROAT: Lips w/o lesions  RESPIRATORY: Breathing is even, unlabored. Lung sounds are clear   CARDIOVASCULAR: Heart RRR no murmurs, rubs or gallops. No peripheral edema.   GASTROINTESTINAL: Abdomen is soft, non-tender, not distended w/ normal bowel sounds. GENITOURINARY: Bladder non tender, not distended  MUSCULOSKELETAL: No abnormal joints or musculature NEUROLOGIC:  Cranial nerves 2-12 grossly intact. Moves all extremities  PSYCHIATRIC: Mood and affect appropriate to situation, no behavioral issues  Patient Active Problem List   Diagnosis Date Noted  . Essential hypertension 01/06/2017  . Morbid obesity ([place]) 01/06/2017  . Hiatal hernia 01/06/2017  . Sepsis ([place]) 06/23/2016  . UTI (urinary tract infection) 06/23/2016  . HLD (hyperlipidemia) 02/08/2015  . Seizures ([place]) 02/08/2015  . Type 2 diabetes mellitus with other circulatory complications ([place]) 05/13/5101  . Stroke with cerebral ischemia ([place])   . Bipolar disorder ([place]) 11/10/2014  . History of opioid abuse 11/10/2014  . Neuropathy due to secondary diabetes mellitus ([place]) 11/10/2014  . TIA (transient ischemic attack) 11/06/2014  . Syncope 10/19/2013  . Cannabis abuse 10/19/2013  . Seizure disorder ([place]) 10/19/2013  . CKD (chronic kidney disease) stage 2, GFR 60-89 ml/min 10/19/2013  . Edema 05/05/2013  . Cardiomegaly 05/05/2013  . Hepatitis C       Labs reviewed: Basic Metabolic  Panel:    Component Value Date/Time   NA 139 01/07/2017 0525   NA 139 01/07/2017   K 4.0 01/07/2017 0525   CL 107 01/07/2017 0525   CO2 23 01/07/2017 0525   GLUCOSE 141 (H) 01/07/2017 0525   BUN 12 01/07/2017 0525   BUN 12 01/07/2017   CREATININE 1.02 01/07/2017 0525   CALCIUM 8.6 (L) 01/07/2017 0525   PROT 6.8 01/07/2017 0525   ALBUMIN 3.2 (L) 01/07/2017 0525   AST 99 (H) 01/07/2017 0525   ALT 46 01/07/2017 0525   ALKPHOS 60 01/07/2017 0525   BILITOT 0.4 01/07/2017 0525   GFRNONAA >60 01/07/2017 0525   GFRAA >60 01/07/2017 0525     Recent Labs  06/24/16 0530  01/02/17 1100 01/07/17 01/07/17 0525  NA 140  < > 140 139 139  K 3.7  < > 3.7 4.0 4.0  CL 111  --  106  --  107  CO2 21*  --  24  --  23  GLUCOSE 113*  --  95  --  141*  BUN 21*  < > 23* 12 12  CREATININE 1.12  < > 1.11 1.0 1.02  CALCIUM 8.8*  --  9.3  --  8.6*  < > = values in this interval not displayed. Liver Function Tests:  Recent Labs  06/23/16 1241 01/02/17 01/02/17 1100 01/07/17 0525  AST 160* 21 21 99*  ALT 30 13 13* 46  ALKPHOS 73 78 78 60  BILITOT 1.3*  --  0.7 0.4  PROT 8.4*  --  8.3* 6.8  ALBUMIN 4.3  --  4.1 3.2*   No results for input(s): LIPASE, AMYLASE in the last 8760 hours. No results for input(s): AMMONIA in the last 8760 hours. CBC:  Recent Labs  01/02/17 1100  01/07/17 01/07/17 0525 01/07/17 1551 01/08/17 01/08/17 0511  WBC 8.0  --  30.0 30.0*  --  19.8 19.8*  NEUTROABS 5.3  --   --  26.1*  --   --  17.0*  HGB 13.3  < > 13.1* 13.1 12.7*  12.7*  --  12.5*  HCT 38.9*  < > 37* 36.9* 35.6*  36*  --  36.7*  MCV 91.3  --   --  91.6  --   --  91.8  PLT 158  --  127* 127*  --   --  137*  < > = values in this interval not displayed. Lipid No results for input(s): CHOL, HDL, LDLCALC, TRIG in the last 8760 hours.  Cardiac Enzymes:  Recent Labs  06/23/16 1241  TROPONINI 0.12*   BNP: No results for input(s): BNP in the last 8760 hours. No results found for:  [facility] Lab Results  Component Value Date   HGBA1C 5.4 01/02/2017   No results found for: TSH No results found for: VITAMINB12 No results found for: FOLATE No results found for: IRON, TIBC, FERRITIN  Imaging and Procedures obtained prior to SNF admission: No results found.   Not all labs, radiology exams or other studies done during hospitalization come through on my EPIC note; however they are reviewed by me.    Assessment and Plan  MORBID OBESITY- s/p laparascopic gastrectomy; pt is being prophylaxed with lovenox SNF - admitted for lovenox teaching, dietary teaching and rehab; will cont lovenox as prophylaxis  SEIZURES SNF - none reportedl plan to cont keppra 500 mg BID  BIPOLAR DISORDER SNF- appears controlled; cont risperdal 1 mg TID and Wellbutrin 300 mg daily  HTN SNF - cont HCTZ 12.5 mg daily; as pt loses weight he may need less  HLD SNF - not stated as uncontrolled; cont lipitor 40 mg daily  DM2 SNF - pt on  No meds, must be diet controlled;will monitor  DIABETIC NEUROPATHY SNF - cont neurontin 300 mg BID  HX PRIOR OPIOID ADDICTION/ HEROIN SNF - pt will need to be referred to a methadone or buprenorphine clinic as he has been exposed to narcotics   Time spent > 45 min;> 50% of time with patient was spent reviewing records, labs, tests and studies, counseling and developing plan of care  Webb Silversmith D. Sheppard Coil, MD

## 2017-01-14 ENCOUNTER — Telehealth (HOSPITAL_COMMUNITY): Payer: Self-pay

## 2017-01-14 NOTE — Telephone Encounter (Signed)
Visited Terry Baxter at [facility] to check post bariatric progress, lying on bed dressed for day watching TV protein shake at bedside.  Terry Baxter stated he is drinking 2 shakes per day (60 grams of protein) and 16 ounces of water.  He walks frequently to bathroom and has been walking in hallway x2/day.  He and I took a walk in the hallway.  Gait was steady and no complaints or distress at that time.  He did indicate he had been receiving juice and we talked about not drinking those unless needed for low blood sugar.  Receiving one multivitamin per day. Discussed with nurse that patient should receive two bariatric vitamins per day and 3 calcium or tums.  Patient reminded of Post op appointment next Tuesday.  No further questions from staff although left contact information for nursing.  All above information relayed to Dr Redmond Pulling regarding patient progress post surgery.  Fax number provided if any future orders needed.

## 2017-01-16 LAB — CBC AND DIFFERENTIAL
HCT: 43 (ref 41–53)
Hemoglobin: 14.3 (ref 13.5–17.5)
PLATELETS: 212 (ref 150–399)
WBC: 10.8

## 2017-01-16 LAB — HEPATIC FUNCTION PANEL
ALK PHOS: 79 (ref 25–125)
ALT: 21 (ref 10–40)
AST: 29 (ref 14–40)
Bilirubin, Total: 0.6

## 2017-01-16 LAB — BASIC METABOLIC PANEL
BUN: 21 (ref 4–21)
Creatinine: 1.1 (ref 0.6–1.3)
GLUCOSE: 79
Potassium: 3.2 — AB (ref 3.4–5.3)
SODIUM: 139 (ref 137–147)

## 2017-01-20 ENCOUNTER — Ambulatory Visit: Payer: Self-pay

## 2017-01-22 ENCOUNTER — Non-Acute Institutional Stay (SKILLED_NURSING_FACILITY): Payer: Medicare Other | Admitting: Internal Medicine

## 2017-01-22 ENCOUNTER — Telehealth (HOSPITAL_COMMUNITY): Payer: Self-pay

## 2017-01-22 ENCOUNTER — Encounter: Payer: Self-pay | Admitting: Internal Medicine

## 2017-01-22 DIAGNOSIS — E1159 Type 2 diabetes mellitus with other circulatory complications: Secondary | ICD-10-CM | POA: Diagnosis not present

## 2017-01-22 DIAGNOSIS — R569 Unspecified convulsions: Secondary | ICD-10-CM

## 2017-01-22 DIAGNOSIS — I10 Essential (primary) hypertension: Secondary | ICD-10-CM

## 2017-01-22 DIAGNOSIS — E1340 Other specified diabetes mellitus with diabetic neuropathy, unspecified: Secondary | ICD-10-CM

## 2017-01-22 DIAGNOSIS — F1111 Opioid abuse, in remission: Secondary | ICD-10-CM

## 2017-01-22 DIAGNOSIS — Z87898 Personal history of other specified conditions: Secondary | ICD-10-CM | POA: Diagnosis not present

## 2017-01-22 DIAGNOSIS — F3175 Bipolar disorder, in partial remission, most recent episode depressed: Secondary | ICD-10-CM

## 2017-01-22 DIAGNOSIS — E785 Hyperlipidemia, unspecified: Secondary | ICD-10-CM | POA: Diagnosis not present

## 2017-01-22 NOTE — Progress Notes (Signed)
Location:  [facility] Room Number: [place]:  [id number])  Provider: Noah Delaine. Sheppard Coil, MD  PCP: Wallene Huh, MD Patient Care Team: Wallene Huh, MD as PCP - General (Cardiology)  Extended Emergency Contact Information Primary Emergency Contact: [facility] Address: [address]          [place], [place] [address] Johnnette Litter of [place] Phone: [phone] Relation: Brother  Allergies  Allergen Reactions  . Tizanidine Other (See Comments)    dizziness    Chief Complaint  Patient presents with  . Discharge Note    discharging to home    HPI:  71 y.o. male  with bipolar dx, seizure dx, DM2, HLD and HTN who was admitted to [facility] for a planned laparoscopic sleeve gastrectomy. Patient's hospital course was compensated Y by a white blood cell count of 30,000. However in upper GI demonstrated no leak and his white count decreased. The patient will need ongoing dietary education as well as Lovenox teaching. Therefore patient is admitted to skilled nursing facility for teaching, monitoring, and physical therapy. Pt is now ready to be d/c to home. Since pt has been exposed to opioids he will likely need to to go to a methadone/buprenorphoine clinic in order to avoid using again.    Past Medical History:  Diagnosis Date  . Anxiety   . Arthritis   . Bipolar 1 disorder ([place])    followed by dr headen every 2 months  . Chronic low back pain    goes to pain center  . CKD (chronic kidney disease), stage II   . DJD (degenerative joint disease)    lower back  . Hepatitis   . History of balanitis   . History of hepatitis C    per pt treated w/ medication and cured  in 2014  . History of heroin abuse    per pt stopped and on was methadone until 10-19-2012--  none since  . History of transient ischemic attack (TIA)    11-06-2014  ?tia--  MRA/ MRI normal  . Hyperlipidemia   . Hypertension   . Nocturia   . Phimosis   . Seizure  disorder [facility])    neurologist-  dr Rosalin Hawking--  last seizure 2014  . Seizures ([place])    once in 2010 none since  . Type 2 diabetes, diet controlled ([place])    diet controlled  . Urinary incontinence    wear depends  . Wears dentures    upper  . Wears glasses     Past Surgical History:  Procedure Laterality Date  . BACK SURGERY  1980's   lower back  . CIRCUMCISION N/A 04/25/2016   Procedure: CIRCUMCISION ADULT;  Surgeon: Alexis Frock, MD;  Location: [facility];  Service: Urology;  Laterality: N/A;  . COLONOSCOPY  2013  . EYE SURGERY Bilateral 09/2016   ioc lens implant for cataracts  . LAPAROSCOPIC GASTRIC SLEEVE RESECTION N/A 01/06/2017   Procedure: LAPAROSCOPIC GASTRIC SLEEVE RESECTION WITH UPPER ENDO,HIATAL HERNIA REPAIR;  Surgeon: Greer Pickerel, MD;  Location: [facility];  Service: General;  Laterality: N/A;  . NEGATIVE SLEEP STUDY  2009  per pt  . surgeru for infection of sternum  1990  . TRANSTHORACIC ECHOCARDIOGRAM  11/06/2014   ef 45-50%/  trivial MR and TR     reports that he has never smoked. He has never used smokeless tobacco. He reports that he does not drink alcohol or use drugs. Social History  Social History  . Marital status: Legally Separated    Spouse name: N/A  . Number of children: N/A  . Years of education: N/A   Occupational History  . retired Pharmacist, hospital    Social History Main Topics  . Smoking status: Never Smoker  . Smokeless tobacco: Never Used  . Alcohol use No  . Drug use: No     Comment: h/o heroin and marijuana use quit 2014 per pt   . Sexual activity: No   Other Topics Concern  . Not on file   Social History Narrative   Lives with room mate at home    Never married    Has 2 children, 4 years of college    1 cup of caffeine a week   Never smoked   Alcohol none   Full code       Pertinent  Health Maintenance Due  Topic Date Due  . FOOT EXAM  [birth year]/24/1955  . OPHTHALMOLOGY EXAM  08/24/1955  . URINE MICROALBUMIN   08/24/1957  . COLONOSCOPY  08/24/1997  . PNA vac Low Risk Adult (1 of 2 - PCV13) 08/24/2012  . INFLUENZA VACCINE  02/25/2017  . HEMOGLOBIN A1C  07/04/2017    Medications: Allergies as of 01/22/2017      Reactions   Tizanidine Other (See Comments)   dizziness      Medication List       Accurate as of 01/22/17 12:29 PM. Always use your most recent med list.          atorvastatin 40 MG tablet Commonly known as:  LIPITOR Take 1 tablet (40 mg total) by mouth daily at 6 PM.   buPROPion 300 MG 24 hr tablet Commonly known as:  WELLBUTRIN XL Take 300 mg by mouth daily with breakfast.   clonazePAM 0.5 MG tablet Commonly known as:  KLONOPIN Take 1 tablet (0.5 mg total) by mouth 3 (three) times daily as needed for anxiety.   doxepin 10 MG capsule Commonly known as:  SINEQUAN TAkE 2 CAPSULES BY MOUTH AT BEDTIME   gabapentin 300 MG capsule Commonly known as:  NEURONTIN Take 300 mg by mouth 2 (two) times daily.   hydrochlorothiazide 12.5 MG capsule Commonly known as:  MICROZIDE Take 12.5 mg by mouth every evening.   levETIRAcetam 500 MG tablet Commonly known as:  KEPPRA Take 500 mg by mouth 2 (two) times daily.   multivitamin with minerals Tabs tablet Take 1 tablet by mouth daily.   oxyCODONE-acetaminophen 5-325 MG/5ML solution Commonly known as:  ROXICET Take 5-10 mLs by mouth every 6 (six) hours as needed. Take 26mls (5 mg) for mild pain and 10 mls (10mg ) for severe pain.   polyethylene glycol packet Commonly known as:  MIRALAX / GLYCOLAX Take 17 g by mouth daily as needed for mild constipation.   risperiDONE 1 MG tablet Commonly known as:  RISPERDAL Take 1 mg by mouth 3 (three) times daily.   SYSTANE BALANCE 0.6 % Soln Generic drug:  Propylene Glycol Place 1 drop into both eyes 3 (three) times daily.        Vitals:   01/22/17 0940  BP: 110/78  Pulse: 88  Resp: 20  Temp: 98.1 F (36.7 C)  SpO2: 93%  Weight: 256 lb 9.6 oz (116.4 kg)  Height: 5\' 8"   (1.727 m)   Body mass index is 39.02 kg/m.  Physical Exam  GENERAL APPEARANCE: Alert, conversant. No acute distress.  HEENT: Unremarkable. RESPIRATORY: Breathing is even, unlabored. Lung sounds are clear  CARDIOVASCULAR: Heart RRR no murmurs, rubs or gallops. No peripheral edema.  GASTROINTESTINAL: Abdomen is soft, non-tender, not distended w/ normal bowel sounds.  NEUROLOGIC: Cranial nerves 2-12 grossly intact. Moves all extremities   Labs reviewed: Basic Metabolic Panel:  Recent Labs  06/24/16 0530  01/02/17 1100 01/07/17 01/07/17 0525  NA 140  < > 140 139 139  K 3.7  < > 3.7 4.0 4.0  CL 111  --  106  --  107  CO2 21*  --  24  --  23  GLUCOSE 113*  --  95  --  141*  BUN 21*  < > 23* 12 12  CREATININE 1.12  < > 1.11 1.0 1.02  CALCIUM 8.8*  --  9.3  --  8.6*  < > = values in this interval not displayed. No results found for: [facility] Liver Function Tests:  Recent Labs  06/23/16 1241 01/02/17 01/02/17 1100 01/07/17 0525  AST 160* 21 21 99*  ALT 30 13 13* 46  ALKPHOS 73 78 78 60  BILITOT 1.3*  --  0.7 0.4  PROT 8.4*  --  8.3* 6.8  ALBUMIN 4.3  --  4.1 3.2*   No results for input(s): LIPASE, AMYLASE in the last 8760 hours. No results for input(s): AMMONIA in the last 8760 hours. CBC:  Recent Labs  01/02/17 1100  01/07/17 01/07/17 0525 01/07/17 1551 01/08/17 01/08/17 0511  WBC 8.0  --  30.0 30.0*  --  19.8 19.8*  NEUTROABS 5.3  --   --  26.1*  --   --  17.0*  HGB 13.3  < > 13.1* 13.1 12.7*  12.7*  --  12.5*  HCT 38.9*  < > 37* 36.9* 35.6*  36*  --  36.7*  MCV 91.3  --   --  91.6  --   --  91.8  PLT 158  --  127* 127*  --   --  137*  < > = values in this interval not displayed. Lipid No results for input(s): CHOL, HDL, LDLCALC, TRIG in the last 8760 hours. Cardiac Enzymes:  Recent Labs  06/23/16 1241  TROPONINI 0.12*   BNP: No results for input(s): BNP in the last 8760 hours. CBG:  Recent Labs  01/09/17 0349 01/09/17 0813 01/09/17 1150    GLUCAP 125* 111* 105*    Procedures and Imaging Studies During Stay: Dg Ugi W/water Sol Cm  Result Date: 01/07/2017 CLINICAL DATA:  71 year old male status post gastric sleeve surgery yesterday. Elevated white blood cell count. EXAM: WATER SOLUBLE UPPER GI SERIES TECHNIQUE: Single-column upper GI series was performed using water soluble contrast. CONTRAST:  50 mL of Isovue-300 orally. COMPARISON:  Upper GI 02/21/2016. FLUOROSCOPY TIME:  Fluoroscopy Time:  2.4 minutes Radiation Exposure Index (if provided by the fluoroscopic device): 35.3 mGy FINDINGS: Preprocedural KUB demonstrates a nonobstructive bowel gas pattern. Suture line projecting over the stomach. Upon ingestion of water soluble contrast, and the esophagus was grossly normal in appearance. Contrast entered the stomach, and the stomach has a characteristic narrowed lumen typical of sleeve gastrectomy. No contrast extravasation was noted during the examination. Contrast readily traversed the pylorus and extended through the duodenum which was normal in appearance. IMPRESSION: 1. Typical findings of sleeve gastrectomy, without evidence of postoperative leak. Electronically Signed   By: Vinnie Langton M.D.   On: 01/07/2017 10:10    Assessment/Plan:   Morbid obesity (HCC)  Seizures ([place])  Bipolar disorder, in partial remission, most recent episode depressed ([place])  Hyperlipidemia, unspecified hyperlipidemia type  Type 2 diabetes mellitus with other circulatory complications (HCC)  Neuropathy due to secondary diabetes mellitus ([place])  Essential hypertension  History of opioid abuse   Patient is being discharged with the following home health services: OT/PT/Nursing   Patient is being discharged with the following durable medical equipment:  none  Patient has been advised to f/u with their PCP in 1-2 weeks to bring them up to date on their rehab stay.  Social services at facility was responsible for arranging this appointment.  Pt  was provided with a 30 day supply of prescriptions for medications and refills must be obtained from their PCP.  For controlled substances, a more limited supply may be provided adequate until PCP appointment only.  Medications have been reconciled.   Time spent . 30 min;> 50% of time with patient was spent reviewing records, labs, tests and studies, counseling and developing plan of care  Noah Delaine. Sheppard Coil, MD

## 2017-01-22 NOTE — Telephone Encounter (Signed)
Spoke with Mr. Terry Baxter via telephone today. Plan is to discharge today from [facility] around 7 pm. He continues to work on his protein intake.  He states he is only getting 2 shakes or 60 grams of protein per day without difficulty.  Encouraged to increase to 3 shakes or intake of 80 grams of protein.  Patient missed post op class Tuesday diet has not been progressed at this time.  Information relayed to [place] via phone.  Mr Terry Baxter stated he would call for follow up appointment.  I have spoken  With [facility] and provided  patient phone number to ensure patient has an appointment scheduled before discharge from [facility].  No new questions at this time from patient.  Will continue to follow progress.

## 2017-01-26 ENCOUNTER — Encounter: Payer: Medicare Other | Attending: General Surgery | Admitting: Skilled Nursing Facility1

## 2017-01-26 ENCOUNTER — Other Ambulatory Visit: Payer: Self-pay

## 2017-01-26 ENCOUNTER — Encounter: Payer: Self-pay | Admitting: Skilled Nursing Facility1

## 2017-01-26 DIAGNOSIS — Z6841 Body Mass Index (BMI) 40.0 and over, adult: Secondary | ICD-10-CM | POA: Insufficient documentation

## 2017-01-26 DIAGNOSIS — Z713 Dietary counseling and surveillance: Secondary | ICD-10-CM | POA: Diagnosis not present

## 2017-01-26 DIAGNOSIS — E119 Type 2 diabetes mellitus without complications: Secondary | ICD-10-CM | POA: Diagnosis not present

## 2017-01-26 DIAGNOSIS — M545 Low back pain: Secondary | ICD-10-CM | POA: Diagnosis not present

## 2017-01-26 DIAGNOSIS — M1990 Unspecified osteoarthritis, unspecified site: Secondary | ICD-10-CM | POA: Insufficient documentation

## 2017-01-26 DIAGNOSIS — F329 Major depressive disorder, single episode, unspecified: Secondary | ICD-10-CM | POA: Insufficient documentation

## 2017-01-26 DIAGNOSIS — E669 Obesity, unspecified: Secondary | ICD-10-CM | POA: Insufficient documentation

## 2017-01-26 DIAGNOSIS — N182 Chronic kidney disease, stage 2 (mild): Secondary | ICD-10-CM

## 2017-01-26 NOTE — Patient Instructions (Addendum)
-  Do no sugar added applesauce with half a protein shake or some protein powder mixed in the applesauce    -Do not eat fruit on the bottom greek yogurt: get low sugar/low fat  -Add low fat cream of chicken to your cut up chicken  -Stick to 2 sugar free popcicles or sugar free jello  -Do your chair exercises 3 times a day

## 2017-01-26 NOTE — Progress Notes (Signed)
Bariatric Class:  Appt start time: 1530 end time:  1630.  2 Week Post-Operative Nutrition Class  Patient was seen on 01/26/2017 for Post-Operative Nutrition education at [facility].   Pt states he has been feeling weak. Pt states he has been drinking 3 protein shakes a day and 32 ounces of water. Pt states he just started the multivitamin Friday as well as the calcium. Pt states he did not sleep well in rehab and has been home for 3 days. Pt does not want to do the [place] scale. Pt likes his banana and soy milk smoothie that his brother sends him.   Surgery date: 01/06/17 Surgery type: Sleeve Start weight at [facility]: 289 lbs Weight today: 250.9.6 lb Wt change: 23.9  The following the learning objectives were met by the patient during this course:  Identifies Phase 3A (Soft, High Proteins) Dietary Goals and will begin from 2 weeks post-operatively to 2 months post-operatively  Identifies appropriate sources of fluids and proteins   States protein recommendations and appropriate sources post-operatively  Identifies the need for appropriate texture modifications, mastication, and bite sizes when consuming solids  Identifies appropriate multivitamin and calcium sources post-operatively  Describes the need for physical activity post-operatively and will follow MD recommendations  States when to call healthcare provider regarding medication questions or post-operative complications  Goals: -Do no sugar added applesauce with half a protein shake or some protein powder mixed in the applesauce   -Do not eat fruit on the bottom greek yogurt: get low sugar/low fat -Add low fat cream of chicken to your cut up chicken -Stick to 2 sugar free popcicles or sugar free jello -Do your chair exercises 3 times a day  Handouts given during class include:  Phase 3A: Soft, High Protein Diet Handout  Follow-Up Plan: Patient will follow-up at [facility] in 6 weeks for 2 month  post-op nutrition visit for diet advancement per MD.

## 2017-01-29 ENCOUNTER — Inpatient Hospital Stay (HOSPITAL_COMMUNITY): Payer: Medicare Other

## 2017-01-29 ENCOUNTER — Encounter (HOSPITAL_COMMUNITY): Payer: Self-pay | Admitting: Emergency Medicine

## 2017-01-29 ENCOUNTER — Inpatient Hospital Stay (HOSPITAL_COMMUNITY)
Admission: EM | Admit: 2017-01-29 | Discharge: 2017-02-01 | DRG: 683 | Disposition: A | Discharge status: Home / Self Care | Payer: Medicare Other | Attending: Internal Medicine | Admitting: Internal Medicine

## 2017-01-29 ENCOUNTER — Emergency Department (HOSPITAL_COMMUNITY): Payer: Medicare Other

## 2017-01-29 DIAGNOSIS — I129 Hypertensive chronic kidney disease with stage 1 through stage 4 chronic kidney disease, or unspecified chronic kidney disease: Secondary | ICD-10-CM | POA: Diagnosis present

## 2017-01-29 DIAGNOSIS — G40909 Epilepsy, unspecified, not intractable, without status epilepticus: Secondary | ICD-10-CM

## 2017-01-29 DIAGNOSIS — M6282 Rhabdomyolysis: Secondary | ICD-10-CM | POA: Diagnosis present

## 2017-01-29 DIAGNOSIS — E1122 Type 2 diabetes mellitus with diabetic chronic kidney disease: Secondary | ICD-10-CM | POA: Diagnosis present

## 2017-01-29 DIAGNOSIS — R7989 Other specified abnormal findings of blood chemistry: Secondary | ICD-10-CM | POA: Diagnosis present

## 2017-01-29 DIAGNOSIS — R531 Weakness: Secondary | ICD-10-CM

## 2017-01-29 DIAGNOSIS — E860 Dehydration: Secondary | ICD-10-CM | POA: Diagnosis present

## 2017-01-29 DIAGNOSIS — N182 Chronic kidney disease, stage 2 (mild): Secondary | ICD-10-CM | POA: Diagnosis present

## 2017-01-29 DIAGNOSIS — E1159 Type 2 diabetes mellitus with other circulatory complications: Secondary | ICD-10-CM | POA: Diagnosis not present

## 2017-01-29 DIAGNOSIS — I10 Essential (primary) hypertension: Secondary | ICD-10-CM | POA: Diagnosis not present

## 2017-01-29 DIAGNOSIS — W1830XA Fall on same level, unspecified, initial encounter: Secondary | ICD-10-CM | POA: Diagnosis present

## 2017-01-29 DIAGNOSIS — E876 Hypokalemia: Secondary | ICD-10-CM | POA: Diagnosis present

## 2017-01-29 DIAGNOSIS — G8929 Other chronic pain: Secondary | ICD-10-CM | POA: Diagnosis present

## 2017-01-29 DIAGNOSIS — B1920 Unspecified viral hepatitis C without hepatic coma: Secondary | ICD-10-CM | POA: Diagnosis not present

## 2017-01-29 DIAGNOSIS — F419 Anxiety disorder, unspecified: Secondary | ICD-10-CM | POA: Diagnosis present

## 2017-01-29 DIAGNOSIS — N189 Chronic kidney disease, unspecified: Secondary | ICD-10-CM

## 2017-01-29 DIAGNOSIS — G4700 Insomnia, unspecified: Secondary | ICD-10-CM | POA: Diagnosis not present

## 2017-01-29 DIAGNOSIS — F3175 Bipolar disorder, in partial remission, most recent episode depressed: Secondary | ICD-10-CM | POA: Diagnosis not present

## 2017-01-29 DIAGNOSIS — N390 Urinary tract infection, site not specified: Secondary | ICD-10-CM

## 2017-01-29 DIAGNOSIS — Z9884 Bariatric surgery status: Secondary | ICD-10-CM | POA: Diagnosis not present

## 2017-01-29 DIAGNOSIS — F319 Bipolar disorder, unspecified: Secondary | ICD-10-CM | POA: Diagnosis present

## 2017-01-29 DIAGNOSIS — R740 Nonspecific elevation of levels of transaminase and lactic acid dehydrogenase [LDH]: Secondary | ICD-10-CM | POA: Diagnosis not present

## 2017-01-29 DIAGNOSIS — N179 Acute kidney failure, unspecified: Secondary | ICD-10-CM

## 2017-01-29 DIAGNOSIS — R7401 Elevation of levels of liver transaminase levels: Secondary | ICD-10-CM

## 2017-01-29 DIAGNOSIS — Z8673 Personal history of transient ischemic attack (TIA), and cerebral infarction without residual deficits: Secondary | ICD-10-CM | POA: Diagnosis not present

## 2017-01-29 DIAGNOSIS — E785 Hyperlipidemia, unspecified: Secondary | ICD-10-CM | POA: Diagnosis present

## 2017-01-29 DIAGNOSIS — S0081XA Abrasion of other part of head, initial encounter: Secondary | ICD-10-CM | POA: Diagnosis present

## 2017-01-29 DIAGNOSIS — E875 Hyperkalemia: Secondary | ICD-10-CM | POA: Diagnosis present

## 2017-01-29 DIAGNOSIS — B9620 Unspecified Escherichia coli [E. coli] as the cause of diseases classified elsewhere: Secondary | ICD-10-CM | POA: Diagnosis present

## 2017-01-29 DIAGNOSIS — F1210 Cannabis abuse, uncomplicated: Secondary | ICD-10-CM | POA: Diagnosis present

## 2017-01-29 DIAGNOSIS — Z79899 Other long term (current) drug therapy: Secondary | ICD-10-CM

## 2017-01-29 DIAGNOSIS — I639 Cerebral infarction, unspecified: Secondary | ICD-10-CM | POA: Diagnosis present

## 2017-01-29 DIAGNOSIS — R29898 Other symptoms and signs involving the musculoskeletal system: Secondary | ICD-10-CM | POA: Diagnosis not present

## 2017-01-29 HISTORY — DX: Acute kidney failure, unspecified: N17.9

## 2017-01-29 LAB — URINALYSIS, COMPLETE (UACMP) WITH MICROSCOPIC
BILIRUBIN URINE: NEGATIVE
Glucose, UA: NEGATIVE mg/dL
Ketones, ur: 5 mg/dL — AB
NITRITE: NEGATIVE
Protein, ur: 100 mg/dL — AB
SQUAMOUS EPITHELIAL / LPF: NONE SEEN
Specific Gravity, Urine: 1.023 (ref 1.005–1.030)
pH: 5 (ref 5.0–8.0)

## 2017-01-29 LAB — BASIC METABOLIC PANEL
Anion gap: 7 (ref 5–15)
BUN: 18 mg/dL (ref 6–20)
CO2: 22 mmol/L (ref 22–32)
Calcium: 8.4 mg/dL — ABNORMAL LOW (ref 8.9–10.3)
Chloride: 108 mmol/L (ref 101–111)
Creatinine, Ser: 1.27 mg/dL — ABNORMAL HIGH (ref 0.61–1.24)
GFR calc Af Amer: >60 mL/min (ref >60)
GFR, EST NON AFRICAN AMERICAN: 56 mL/min — AB (ref >60)
GLUCOSE: 103 mg/dL — AB (ref 65–99)
Potassium: 3 mmol/L — ABNORMAL LOW (ref 3.5–5.1)
Sodium: 137 mmol/L (ref 135–145)

## 2017-01-29 LAB — COMPREHENSIVE METABOLIC PANEL
ALK PHOS: 65 U/L (ref 38–126)
ALT: 22 U/L (ref 17–63)
ANION GAP: 10 (ref 5–15)
AST: 58 U/L — ABNORMAL HIGH (ref 15–41)
Albumin: 3.9 g/dL (ref 3.5–5.0)
BUN: 17 mg/dL (ref 6–20)
CALCIUM: 9.5 mg/dL (ref 8.9–10.3)
CHLORIDE: 105 mmol/L (ref 101–111)
CO2: 25 mmol/L (ref 22–32)
Creatinine, Ser: 1.41 mg/dL — ABNORMAL HIGH (ref 0.61–1.24)
GFR calc Af Amer: 57 mL/min — ABNORMAL LOW (ref >60)
GFR calc non Af Amer: 49 mL/min — ABNORMAL LOW (ref >60)
GLUCOSE: 129 mg/dL — AB (ref 65–99)
Potassium: 2.4 mmol/L — CL (ref 3.5–5.1)
SODIUM: 140 mmol/L (ref 135–145)
Total Bilirubin: 0.9 mg/dL (ref 0.3–1.2)
Total Protein: 7.4 g/dL (ref 6.5–8.1)

## 2017-01-29 LAB — CBC WITH DIFFERENTIAL/PLATELET
Basophils Absolute: 0 10*3/uL (ref 0.0–0.1)
Basophils Relative: 0 %
EOS ABS: 0 10*3/uL (ref 0.0–0.7)
Eosinophils Relative: 0 %
HEMATOCRIT: 40.1 % (ref 39.0–52.0)
HEMOGLOBIN: 13.7 g/dL (ref 13.0–17.0)
LYMPHS ABS: 0.8 10*3/uL (ref 0.7–4.0)
LYMPHS PCT: 8 %
MCH: 30.9 pg (ref 26.0–34.0)
MCHC: 34.2 g/dL (ref 30.0–36.0)
MCV: 90.5 fL (ref 78.0–100.0)
MONOS PCT: 11 %
Monocytes Absolute: 1.3 10*3/uL — ABNORMAL HIGH (ref 0.1–1.0)
NEUTROS ABS: 8.9 10*3/uL — AB (ref 1.7–7.7)
NEUTROS PCT: 81 %
Platelets: 199 10*3/uL (ref 150–400)
RBC: 4.43 MIL/uL (ref 4.22–5.81)
RDW: 14.3 % (ref 11.5–15.5)
WBC: 11 10*3/uL — ABNORMAL HIGH (ref 4.0–10.5)

## 2017-01-29 LAB — GLUCOSE, CAPILLARY: Glucose-Capillary: 98 mg/dL (ref 65–99)

## 2017-01-29 LAB — PHOSPHORUS: PHOSPHORUS: 1.2 mg/dL — AB (ref 2.5–4.6)

## 2017-01-29 LAB — PROTIME-INR
INR: 1.13
Prothrombin Time: 14.5 seconds (ref 11.4–15.2)

## 2017-01-29 LAB — I-STAT CG4 LACTIC ACID, ED: Lactic Acid, Venous: 1.64 mmol/L (ref 0.5–1.9)

## 2017-01-29 LAB — MAGNESIUM: Magnesium: 1.8 mg/dL (ref 1.7–2.4)

## 2017-01-29 LAB — CK: CK TOTAL: 3113 U/L — AB (ref 49–397)

## 2017-01-29 MED ORDER — IOPAMIDOL (ISOVUE-300) INJECTION 61%
INTRAVENOUS | Status: AC
  Filled 2017-01-29: qty 30

## 2017-01-29 MED ORDER — ACETAMINOPHEN 650 MG RE SUPP: 650.0000 mg | Freq: Four times a day (QID) | RECTAL | Status: DC | PRN

## 2017-01-29 MED ORDER — INSULIN ASPART 100 UNIT/ML ~~LOC~~ SOLN
0.0000 [IU] | Freq: Three times a day (TID) | SUBCUTANEOUS | Status: DC
  Administered 2017-02-01: 3 [IU] via SUBCUTANEOUS

## 2017-01-29 MED ORDER — ADULT MULTIVITAMIN W/MINERALS CH
1.0000 | ORAL_TABLET | Freq: Every day | ORAL | Status: DC
  Administered 2017-01-30 – 2017-01-31 (×2): 1 via ORAL
  Filled 2017-01-29 (×2): qty 1

## 2017-01-29 MED ORDER — POTASSIUM CHLORIDE 10 MEQ/100ML IV SOLN
10.0000 meq | INTRAVENOUS | Status: AC
  Administered 2017-01-29 (×3): 10 meq via INTRAVENOUS
  Filled 2017-01-29 (×3): qty 100

## 2017-01-29 MED ORDER — HEPARIN SODIUM (PORCINE) 5000 UNIT/ML IJ SOLN
5000.0000 [IU] | Freq: Three times a day (TID) | INTRAMUSCULAR | Status: DC
  Administered 2017-01-29 – 2017-02-01 (×8): 5000 [IU] via SUBCUTANEOUS
  Filled 2017-01-29 (×8): qty 1

## 2017-01-29 MED ORDER — POTASSIUM PHOSPHATES 15 MMOLE/5ML IV SOLN
10.0000 mmol | Freq: Once | INTRAVENOUS | Status: DC
  Filled 2017-01-29: qty 3.33

## 2017-01-29 MED ORDER — POTASSIUM PHOSPHATES 15 MMOLE/5ML IV SOLN
10.0000 mmol | Freq: Once | INTRAVENOUS | Status: AC
  Administered 2017-01-30: 10 mmol via INTRAVENOUS
  Filled 2017-01-29 (×2): qty 3.33

## 2017-01-29 MED ORDER — ONDANSETRON HCL 4 MG/2ML IJ SOLN: 4.0000 mg | Freq: Four times a day (QID) | INTRAMUSCULAR | Status: DC | PRN

## 2017-01-29 MED ORDER — BUPROPION HCL ER (XL) 150 MG PO TB24
300.0000 mg | ORAL_TABLET | Freq: Every day | ORAL | Status: DC
  Administered 2017-01-30 – 2017-02-01 (×3): 300 mg via ORAL
  Filled 2017-01-29 (×3): qty 2

## 2017-01-29 MED ORDER — BACITRACIN ZINC 500 UNIT/GM EX OINT
TOPICAL_OINTMENT | Freq: Two times a day (BID) | CUTANEOUS | Status: DC
  Administered 2017-01-30 – 2017-02-01 (×5): via TOPICAL
  Filled 2017-01-29: qty 28.35

## 2017-01-29 MED ORDER — ATORVASTATIN CALCIUM 40 MG PO TABS
40.0000 mg | ORAL_TABLET | Freq: Every day | ORAL | Status: DC
  Administered 2017-01-29: 40 mg via ORAL
  Filled 2017-01-29: qty 1

## 2017-01-29 MED ORDER — ONDANSETRON HCL 4 MG PO TABS: 4.0000 mg | ORAL_TABLET | Freq: Four times a day (QID) | ORAL | Status: DC | PRN

## 2017-01-29 MED ORDER — POLYVINYL ALCOHOL 1.4 % OP SOLN
1.0000 [drp] | Freq: Three times a day (TID) | OPHTHALMIC | Status: DC
  Administered 2017-01-30 – 2017-02-01 (×7): 1 [drp] via OPHTHALMIC
  Filled 2017-01-29 (×2): qty 15

## 2017-01-29 MED ORDER — SODIUM CHLORIDE 0.9 % IV SOLN
INTRAVENOUS | Status: DC
  Administered 2017-01-29 – 2017-02-01 (×4): via INTRAVENOUS

## 2017-01-29 MED ORDER — RISPERIDONE 1 MG PO TABS
1.0000 mg | ORAL_TABLET | Freq: Three times a day (TID) | ORAL | Status: DC
  Administered 2017-01-29 – 2017-02-01 (×9): 1 mg via ORAL
  Filled 2017-01-29 (×11): qty 1

## 2017-01-29 MED ORDER — HYDRALAZINE HCL 20 MG/ML IJ SOLN: 10.0000 mg | Freq: Three times a day (TID) | INTRAMUSCULAR | Status: DC | PRN

## 2017-01-29 MED ORDER — POTASSIUM CHLORIDE 10 MEQ/100ML IV SOLN
10.0000 meq | Freq: Once | INTRAVENOUS | Status: AC
  Administered 2017-01-29: 10 meq via INTRAVENOUS
  Filled 2017-01-29: qty 100

## 2017-01-29 MED ORDER — SODIUM CHLORIDE 0.9% FLUSH
3.0000 mL | Freq: Two times a day (BID) | INTRAVENOUS | Status: DC
  Administered 2017-01-29 – 2017-02-01 (×3): 3 mL via INTRAVENOUS

## 2017-01-29 MED ORDER — ACETAMINOPHEN 325 MG PO TABS
650.0000 mg | ORAL_TABLET | Freq: Four times a day (QID) | ORAL | Status: DC | PRN
  Filled 2017-01-29: qty 2

## 2017-01-29 MED ORDER — POTASSIUM CHLORIDE CRYS ER 20 MEQ PO TBCR
60.0000 meq | EXTENDED_RELEASE_TABLET | Freq: Once | ORAL | Status: AC
  Administered 2017-01-29: 60 meq via ORAL
  Filled 2017-01-29: qty 3

## 2017-01-29 MED ORDER — SODIUM CHLORIDE 0.9 % IV BOLUS (SEPSIS)
1000.0000 mL | Freq: Once | INTRAVENOUS | Status: AC
  Administered 2017-01-29: 1000 mL via INTRAVENOUS

## 2017-01-29 MED ORDER — LORAZEPAM 2 MG/ML IJ SOLN
2.0000 mg | INTRAMUSCULAR | Status: DC | PRN
  Administered 2017-01-29: 2 mg via INTRAVENOUS
  Filled 2017-01-29 (×2): qty 1

## 2017-01-29 MED ORDER — LEVETIRACETAM 500 MG PO TABS
500.0000 mg | ORAL_TABLET | Freq: Two times a day (BID) | ORAL | Status: DC
  Administered 2017-01-29 – 2017-02-01 (×7): 500 mg via ORAL
  Filled 2017-01-29 (×7): qty 1
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
## 2017-01-29 NOTE — ED Notes (Signed)
Pericare done

## 2017-01-29 NOTE — ED Notes (Signed)
Report given to kim RN.

## 2017-01-29 NOTE — ED Notes (Signed)
ED Provider at bedside. 

## 2017-01-29 NOTE — ED Notes (Signed)
Pt arrives in dirty clothes that smell of urine pt also incontinent. Pt was cleaned and placed in clean gown and on dry pads.

## 2017-01-29 NOTE — ED Notes (Signed)
Admitting at bedside 

## 2017-01-29 NOTE — ED Notes (Signed)
Report attempted nurse to call back.  

## 2017-01-29 NOTE — ED Provider Notes (Signed)
[facility] DEPT Provider Note   CSN: [id number] Arrival date & time: 01/29/17  1111     History   Chief Complaint Chief Complaint  Patient presents with  . Fall  . Weakness    HPI Terry Baxter is a 71 y.o. male.  HPI  Patient presents due to weakness, and a fall. Patient has multiple medical issues including gastric sleeve procedure one month ago. He notes that he was generally well until the past 4 or 5 days when he developed weakness, which has progressed since onset. No focal weakness, no syncope, no confusion, no disorientation. Patient had a fall just before ED arrival today, due to weakness. He recalls the entire event. Patient has abrasion to his chin, left cheek, but denies pain in these areas, he denies broken teeth, vision changes, headache, weakness in any extremity after the fall. Initially he denies any pain in his lower extremities as well, but was evaluation he states that he has some pain in his left anterior hip, but no weakness in that extremity.   Past Medical History:  Diagnosis Date  . Anxiety   . Arthritis   . Bipolar 1 disorder ([place])    followed by dr headen every 2 months  . Chronic low back pain    goes to pain center  . CKD (chronic kidney disease), stage II   . DJD (degenerative joint disease)    lower back  . Hepatitis   . History of balanitis   . History of hepatitis C    per pt treated w/ medication and cured  in 2014  . History of heroin abuse    per pt stopped and on was methadone until 10-19-2012--  none since  . History of transient ischemic attack (TIA)    11-06-2014  ?tia--  MRA/ MRI normal  . Hyperlipidemia   . Hypertension   . Nocturia   . Phimosis   . Seizure disorder [facility])    neurologist-  dr Rosalin Hawking--  last seizure 2014  . Seizures ([place])    once in 2010 none since  . Type 2 diabetes, diet controlled ([place])    diet controlled  . Urinary incontinence    wear depends  . Wears dentures    upper  . Wears glasses      Patient Active Problem List   Diagnosis Date Noted  . Essential hypertension 01/06/2017  . Morbid obesity ([place]) 01/06/2017  . Hiatal hernia 01/06/2017  . Sepsis ([place]) 06/23/2016  . UTI (urinary tract infection) 06/23/2016  . HLD (hyperlipidemia) 02/08/2015  . Seizures ([place]) 02/08/2015  . Type 2 diabetes mellitus with other circulatory complications ([place]) 61/60/7371  . Stroke with cerebral ischemia ([place])   . Bipolar disorder ([place]) 11/10/2014  . History of opioid abuse 11/10/2014  . Neuropathy due to secondary diabetes mellitus ([place]) 11/10/2014  . TIA (transient ischemic attack) 11/06/2014  . Syncope 10/19/2013  . Cannabis abuse 10/19/2013  . Seizure disorder ([place]) 10/19/2013  . CKD (chronic kidney disease) stage 2, GFR 60-89 ml/min 10/19/2013  . Edema 05/05/2013  . Cardiomegaly 05/05/2013  . Hepatitis C     Past Surgical History:  Procedure Laterality Date  . BACK SURGERY  1980's   lower back  . CIRCUMCISION N/A 04/25/2016   Procedure: CIRCUMCISION ADULT;  Surgeon: Alexis Frock, MD;  Location: [facility];  Service: Urology;  Laterality: N/A;  . COLONOSCOPY  2013  . EYE SURGERY Bilateral 09/2016   ioc lens implant for cataracts  .  LAPAROSCOPIC GASTRIC SLEEVE RESECTION N/A 01/06/2017   Procedure: LAPAROSCOPIC GASTRIC SLEEVE RESECTION WITH UPPER ENDO,HIATAL HERNIA REPAIR;  Surgeon: Greer Pickerel, MD;  Location: [facility];  Service: General;  Laterality: N/A;  . NEGATIVE SLEEP STUDY  2009  per pt  . surgeru for infection of sternum  1990  . TRANSTHORACIC ECHOCARDIOGRAM  11/06/2014   ef 45-50%/  trivial MR and TR       Home Medications    Prior to Admission medications   Medication Sig Start Date End Date Taking? Authorizing Provider  atorvastatin (LIPITOR) 40 MG tablet Take 1 tablet (40 mg total) by mouth daily at 6 PM. 11/09/14   Charolette Forward, MD  buPROPion (WELLBUTRIN XL) 300 MG 24 hr tablet Take 300 mg by mouth daily with breakfast. 06/02/16    [provider]  clonazePAM (KLONOPIN) 0.5 MG tablet Take 1 tablet (0.5 mg total) by mouth 3 (three) times daily as needed for anxiety. 01/09/17   Greer Pickerel, MD  doxepin (SINEQUAN) 10 MG capsule TAkE 2 CAPSULES BY MOUTH AT BEDTIME 07/07/15   [provider]  gabapentin (NEURONTIN) 300 MG capsule Take 300 mg by mouth 2 (two) times daily. 12/05/16   [provider]  hydrochlorothiazide (MICROZIDE) 12.5 MG capsule Take 12.5 mg by mouth every evening.  01/03/15   [provider]  levETIRAcetam (KEPPRA) 500 MG tablet Take 500 mg by mouth 2 (two) times daily.    [provider]  Multiple Vitamin (MULTIVITAMIN WITH MINERALS) TABS tablet Take 1 tablet by mouth daily.    [provider]  oxyCODONE-acetaminophen (ROXICET) 5-325 MG/5ML solution Take 5 mLs by mouth every 6 (six) hours as needed. Take 51mls (5 mg) for mild pain and 10 mls (10mg ) for severe pain.     [provider]  polyethylene glycol (MIRALAX / GLYCOLAX) packet Take 17 g by mouth daily as needed for mild constipation.    [provider]  Propylene Glycol (SYSTANE BALANCE) 0.6 % SOLN Place 1 drop into both eyes 3 (three) times daily.    [provider]  risperiDONE (RISPERDAL) 1 MG tablet Take 1 mg by mouth 3 (three) times daily.  02/06/16   [provider]    Family History No family history on file.  Social History Social History  Substance Use Topics  . Smoking status: Never Smoker  . Smokeless tobacco: Never Used  . Alcohol use No     Allergies   Tizanidine   Review of Systems Review of Systems  Constitutional:       Per HPI, otherwise negative  HENT:       Per HPI, otherwise negative  Respiratory:       Per HPI, otherwise negative  Cardiovascular:       Per HPI, otherwise negative  Gastrointestinal: Negative for vomiting.  Endocrine:       Negative aside from HPI  Genitourinary:       Neg aside from HPI   Musculoskeletal:        Per HPI, otherwise negative  Skin: Positive for wound.  Neurological: Positive for weakness. Negative for syncope.     Physical Exam Updated Vital Signs BP 100/79   Pulse (!) 102   Temp 98.6 F (37 C) (Tympanic)   Resp 18   Ht 5\' 8"  (1.727 m)   Wt 113.4 kg (250 lb)   SpO2 95%   BMI 38.01 kg/m   Physical Exam  Constitutional: He is oriented to person, place, and time. He  appears well-developed. He appears listless. He has a sickly appearance.  HENT:  Head: Normocephalic and atraumatic.    Mouth/Throat: Mucous membranes are dry.  No malocclusion, no broken teeth  Eyes: Conjunctivae and EOM are normal.  Cardiovascular: Normal rate and regular rhythm.   Pulmonary/Chest: Effort normal. No stridor. No respiratory distress.  Abdominal: He exhibits no distension. There is no tenderness.  Musculoskeletal: He exhibits no edema.       Right hip: Normal.       Right knee: Normal.       Left knee: Normal.       Right ankle: Normal.       Left ankle: Normal.       Legs: Neurological: He is oriented to person, place, and time. He appears listless. He displays atrophy. He displays no tremor. He exhibits normal muscle tone. He displays no seizure activity. Coordination normal.  Skin: Skin is warm and dry.  Psychiatric: He has a normal mood and affect.  Nursing note and vitals reviewed.    ED Treatments / Results  Labs (all labs ordered are listed, but only abnormal results are displayed) Labs Reviewed  COMPREHENSIVE METABOLIC PANEL - Abnormal; Notable for the following:       Result Value   Potassium 2.4 (*)    Glucose, Bld 129 (*)    Creatinine, Ser 1.41 (*)    AST 58 (*)    GFR calc non Af Amer 49 (*)    GFR calc Af Amer 57 (*)    All other components within normal limits  CBC WITH DIFFERENTIAL/PLATELET - Abnormal; Notable for the following:    WBC 11.0 (*)    Neutro Abs 8.9 (*)    Monocytes Absolute 1.3 (*)    All other components within normal limits  URINE CULTURE    PROTIME-INR  URINALYSIS, ROUTINE W REFLEX MICROSCOPIC  URINALYSIS, COMPLETE (UACMP) WITH MICROSCOPIC  I-STAT CG4 LACTIC ACID, ED  CBG MONITORING, ED    EKG  EKG Interpretation  Date/Time:  Thursday January 29 2017 11:11:46 EDT Ventricular Rate:  104 PR Interval:    QRS Duration: 83 QT Interval:  400 QTC Calculation: 527 R Axis:   7 Text Interpretation:  Sinus tachycardia Low voltage, extremity and precordial leads Prolonged QT interval Artifact T wave abnormality Abnormal ekg Confirmed by Carmin Muskrat [phone]) on 01/29/2017 11:26:02 AM       Radiology Dg Chest Port 1 View  Result Date: 01/29/2017 CLINICAL DATA:  Found on the floor.  Fall. EXAM: PORTABLE CHEST 1 VIEW COMPARISON:  06/23/2006 FINDINGS: Heart and mediastinal contours are within normal limits. No focal opacities or effusions. No acute bony abnormality. IMPRESSION: No active disease. Electronically Signed   By: Rolm Baptise M.D.   On: 01/29/2017 12:06   Dg Hip Unilat W Or Wo Pelvis 2-3 Views Left  Result Date: 01/29/2017 FALL CLINICAL DATA: . EXAM: DG HIP (WITH OR WITHOUT PELVIS) 2-3V LEFT COMPARISON:  06/23/2016 FINDINGS: Mild symmetric degenerative changes in the hips. SI joint symmetric and unremarkable. No acute bony abnormality. Specifically, no fracture, subluxation, or dislocation. Soft tissues are intact. IMPRESSION: No acute bony abnormality. Electronically Signed   By: Rolm Baptise M.D.   On: 01/29/2017 12:07    Procedures Procedures (including critical care time)  Medications Ordered in ED Medications  sodium chloride 0.9 % bolus 1,000 mL (0 mLs Intravenous Stopped 01/29/17 1321)    And  0.9 %  sodium chloride infusion ( Intravenous New Bag/Given 01/29/17 1332)  potassium chloride 10 mEq in 100 mL IVPB (10 mEq Intravenous New Bag/Given 01/29/17 1332)  potassium chloride SA (K-DUR,KLOR-CON) CR tablet 60 mEq (60 mEq Oral Given 01/29/17 1332)     Initial Impression / Assessment and Plan / ED Course  I have  reviewed the triage vital signs and the nursing notes.  Pertinent labs & imaging results that were available during my care of the patient were reviewed by me and considered in my medical decision making (see chart for details).  1:39 PM On repeat exam patient in similar condition, listless appearing. He continues to appear dry, dehydrated, and with initial findings notable for critically abnormal potassium, he has begun to receive potassium supplementation, IV and oral. Patient also continues to receive saline.  Final Clinical Impressions(s) / ED Diagnoses  Weakness Hypokalemia   Carmin Muskrat, MD 01/29/17 1343

## 2017-01-29 NOTE — ED Notes (Signed)
Pt speaking on phone with family 

## 2017-01-29 NOTE — ED Notes (Signed)
Xray at beside at this time.

## 2017-01-29 NOTE — ED Notes (Signed)
Pt getting second run of 10 MEq of potassium due just one more.

## 2017-01-29 NOTE — H&P (Addendum)
[facility] History and Physical  Visente Kirker QQP:[id number] DOB: [birth year] DOA: 01/29/2017  Referring physician:  PCP: Wallene Huh, MD   Chief Complaint: "My legs are just weak."  HPI: Terry Baxter is a 71 y.o. male  with past medical history significant for anxiety, bipolar 1, low back pain, hepatitis C, history of heroin abuse, seizure disorder presents to the hospital with weakness. Patient states he was in his normal state of health until early today when he fell down and couldn't get back up. He states that he he became very shaky in his legs and then suddenly weak. He then fell to the floor. Denies hitting his head or loss of consciousness. Pt states he feels he was on the floor for approximately an hour. Patient's landlord happy to come by to find him on the floor. EMS reports patient was covered in feces and urine and there were bugs all over.  Positive for left upper extremity weakness. Denies fever, chills, nausea, vomiting, diarrhea, dysuria.  ED course: Patient found to have multiple abrasions and these were covered with bacitracin, chest x-ray negative, hip x-ray negative. Hospitalist consulted for admission.  Review of Systems:  As per HPI otherwise 10 point review of systems negative.    Past Medical History:  Diagnosis Date  . Anxiety   . Arthritis   . Bipolar 1 disorder ([place])    followed by dr headen every 2 months  . Chronic low back pain    goes to pain center  . CKD (chronic kidney disease), stage II   . DJD (degenerative joint disease)    lower back  . Hepatitis   . History of balanitis   . History of hepatitis C    per pt treated w/ medication and cured  in 2014  . History of heroin abuse    per pt stopped and on was methadone until 10-19-2012--  none since  . History of transient ischemic attack (TIA)    11-06-2014  ?tia--  MRA/ MRI normal  . Hyperlipidemia   . Hypertension   . Nocturia   . Phimosis   . Seizure disorder [facility])    neurologist-  dr Rosalin Hawking--  last seizure 2014  . Seizures ([place])    once in 2010 none since  . Type 2 diabetes, diet controlled ([place])    diet controlled  . Urinary incontinence    wear depends  . Wears dentures    upper  . Wears glasses    Past Surgical History:  Procedure Laterality Date  . BACK SURGERY  1980's   lower back  . CIRCUMCISION N/A 04/25/2016   Procedure: CIRCUMCISION ADULT;  Surgeon: Alexis Frock, MD;  [facility];  Service: Urology;  Laterality: N/A;  . COLONOSCOPY  2013  . EYE SURGERY Bilateral 09/2016   ioc lens implant for cataracts  . LAPAROSCOPIC GASTRIC SLEEVE RESECTION N/A 01/06/2017   Procedure: LAPAROSCOPIC GASTRIC SLEEVE RESECTION WITH UPPER ENDO,HIATAL HERNIA REPAIR;  Surgeon: Greer Pickerel, MD;  Location: [facility];  Service: General;  Laterality: N/A;  . NEGATIVE SLEEP STUDY  2009  per pt  . surgeru for infection of sternum  1990  . TRANSTHORACIC ECHOCARDIOGRAM  11/06/2014   ef 45-50%/  trivial MR and TR   Social History:  reports that he has never smoked. He has never used smokeless tobacco. He reports that he does not drink alcohol or use drugs.  Allergies  Allergen Reactions  . Tizanidine Other (See Comments)  dizziness    No family history on file.   Prior to Admission medications   Medication Sig Start Date End Date Taking? Authorizing Provider  atorvastatin (LIPITOR) 40 MG tablet Take 1 tablet (40 mg total) by mouth daily at 6 PM. 11/09/14   Charolette Forward, MD  buPROPion (WELLBUTRIN XL) 300 MG 24 hr tablet Take 300 mg by mouth daily with breakfast. 06/02/16   [provider]  clonazePAM (KLONOPIN) 0.5 MG tablet Take 1 tablet (0.5 mg total) by mouth 3 (three) times daily as needed for anxiety. 01/09/17   Greer Pickerel, MD  doxepin (SINEQUAN) 10 MG capsule TAkE 2 CAPSULES BY MOUTH AT BEDTIME 07/07/15   [provider]  gabapentin (NEURONTIN) 300 MG capsule Take 300 mg by mouth 2 (two) times daily.  12/05/16   [provider]  hydrochlorothiazide (MICROZIDE) 12.5 MG capsule Take 12.5 mg by mouth every evening.  01/03/15   [provider]  levETIRAcetam (KEPPRA) 500 MG tablet Take 500 mg by mouth 2 (two) times daily.    [provider]  Multiple Vitamin (MULTIVITAMIN WITH MINERALS) TABS tablet Take 1 tablet by mouth daily.    [provider]  oxyCODONE-acetaminophen (ROXICET) 5-325 MG/5ML solution Take 5 mLs by mouth every 6 (six) hours as needed. Take 22mls (5 mg) for mild pain and 10 mls (10mg ) for severe pain.     [provider]  polyethylene glycol (MIRALAX / GLYCOLAX) packet Take 17 g by mouth daily as needed for mild constipation.    [provider]  Propylene Glycol (SYSTANE BALANCE) 0.6 % SOLN Place 1 drop into both eyes 3 (three) times daily.    [provider]  risperiDONE (RISPERDAL) 1 MG tablet Take 1 mg by mouth 3 (three) times daily.  02/06/16   [provider]   Physical Exam: Vitals:   01/29/17 1245 01/29/17 1300 01/29/17 1315 01/29/17 1330  BP: 115/68 120/67 120/72 120/71  Pulse: 82 81 80 81  Resp: 20 20 (!) 22 16  Temp:      TempSrc:      SpO2: 95% 96% 96% 97%  Weight:      Height:        Wt Readings from Last 3 Encounters:  01/29/17 113.4 kg (250 lb)  01/26/17 113.7 kg (250 lb 9.6 oz)  01/22/17 116.4 kg (256 lb 9.6 oz)    General:  Appears calm and comfortable. Alert and oriented 3 Eyes:  PERRL, EOMI, normal lids, iris ENT:  grossly normal hearing, lips & tongue, and edentulous, lip smacking Neck:  no LAD, masses or thyromegaly Cardiovascular:  RRR, no m/r/g. No LE edema.  Respiratory:  CTA bilaterally, no w/r/r. Normal respiratory effort. Abdomen:  soft, ntnd Skin:  no rash or induration seen on limited exam, multiple abrasions Musculoskeletal:  grossly normal tone BUE/BLE Psychiatric:  grossly normal mood and affect, speech fluent and appropriate Neurologic:  CN 2-12 grossly intact,  moves all extremities in coordinated fashion.          Labs on Admission:  Basic Metabolic Panel:  Recent Labs Lab 01/29/17 1159  NA 140  K 2.4*  CL 105  CO2 25  GLUCOSE 129*  BUN 17  CREATININE 1.41*  CALCIUM 9.5   Liver Function Tests:  Recent Labs Lab 01/29/17 1159  AST 58*  ALT 22  ALKPHOS 65  BILITOT 0.9  PROT 7.4  ALBUMIN 3.9   No results for input(s): LIPASE, AMYLASE in the last 168 hours. No results for  input(s): AMMONIA in the last 168 hours. CBC:  Recent Labs Lab 01/29/17 1159  WBC 11.0*  NEUTROABS 8.9*  HGB 13.7  HCT 40.1  MCV 90.5  PLT 199   Cardiac Enzymes: No results for input(s): CKTOTAL, CKMB, CKMBINDEX, TROPONINI in the last 168 hours.  BNP (last 3 results) No results for input(s): BNP in the last 8760 hours.  ProBNP (last 3 results) No results for input(s): PROBNP in the last 8760 hours.   Serum creatinine: 1.41 mg/dL (H) 01/29/17 1159 Estimated creatinine clearance: 60.4 mL/min (A)  CBG: No results for input(s): GLUCAP in the last 168 hours.  Radiological Exams on Admission: Dg Chest Port 1 View  Result Date: 01/29/2017 CLINICAL DATA:  Found on the floor.  Fall. EXAM: PORTABLE CHEST 1 VIEW COMPARISON:  06/23/2006 FINDINGS: Heart and mediastinal contours are within normal limits. No focal opacities or effusions. No acute bony abnormality. IMPRESSION: No active disease. Electronically Signed   By: Rolm Baptise M.D.   On: 01/29/2017 12:06   Dg Hip Unilat W Or Wo Pelvis 2-3 Views Left  Result Date: 01/29/2017 FALL CLINICAL DATA: . EXAM: DG HIP (WITH OR WITHOUT PELVIS) 2-3V LEFT COMPARISON:  06/23/2016 FINDINGS: Mild symmetric degenerative changes in the hips. SI joint symmetric and unremarkable. No acute bony abnormality. Specifically, no fracture, subluxation, or dislocation. Soft tissues are intact. IMPRESSION: No acute bony abnormality. Electronically Signed   By: Rolm Baptise M.D.   On: 01/29/2017 12:07    EKG: Independently  reviewed. Compared to June 8 EKG patient now has prolonged QT. Normal sinus rhythm. No STEMI.  Assessment/Plan Principal Problem:   Acute on chronic renal failure (HCC) Active Problems:   Hepatitis C   Cannabis abuse   Seizure disorder (HCC)   Bipolar disorder (HCC)   Type 2 diabetes mellitus with other circulatory complications (HCC)   Essential hypertension   Hyperkalemia   Weakness   AKI (acute kidney injury) ([place])  AKI Baseline Cr 1.1, Cr on admit 1.41 1 L of normal saline given in the emergency room Gentle hydration overnight Checking magnesium and phosphorus Urine labs ordered to calculate fractional excretion of urea Hold hydrochlorothiazide I&Os We'll get bladder scan Will defer kidney imaging for now since this is likely due to patient being on the floor for an unknown amount of time  Fall Physical therapy Checking CK Check urine drug screen due to history of drug abuse Holding Roxicet Hypokalemia Chronic issue Replete IV and by mouth Check other electrolytes  Weakness and left upper extremity weakness Likely due to multiple electrolyte abnormalities, could be due to hydrochlorothiazide Physical therapy eval and treat Checking head CT-patient's weakness seems more global, consider MRI if no improvement with treating of multitude of other issues Holding Roxicet, Klonopin, gabapentin  Seizures Continue Keppra Seizure precautions Ativan when necessary  Depression No SI HI Continue Wellbutrin and Risperdal  Anxiety Continue Klonopin  Insomnia Hold doxepin  Chronic pain Continue gabapentin When necessary Tylenol  Hypertension Hold hydrochlorothiazide When necessary hydralazine  DM SSI AC  Hyperlipidemia Continue statin  Code Status: Full code  DVT Prophylaxis: Heparin Family Communication: none available  Disposition Plan: Pending Improvement  Status: Observation telemetry  Elwin Mocha, MD Family Medicine [facility] [email or web address] Password [email or web address]

## 2017-01-30 DIAGNOSIS — R29898 Other symptoms and signs involving the musculoskeletal system: Secondary | ICD-10-CM

## 2017-01-30 DIAGNOSIS — R531 Weakness: Secondary | ICD-10-CM

## 2017-01-30 DIAGNOSIS — F3175 Bipolar disorder, in partial remission, most recent episode depressed: Secondary | ICD-10-CM

## 2017-01-30 DIAGNOSIS — N189 Chronic kidney disease, unspecified: Secondary | ICD-10-CM

## 2017-01-30 DIAGNOSIS — N179 Acute kidney failure, unspecified: Principal | ICD-10-CM

## 2017-01-30 DIAGNOSIS — E875 Hyperkalemia: Secondary | ICD-10-CM

## 2017-01-30 DIAGNOSIS — I10 Essential (primary) hypertension: Secondary | ICD-10-CM

## 2017-01-30 DIAGNOSIS — G4700 Insomnia, unspecified: Secondary | ICD-10-CM

## 2017-01-30 DIAGNOSIS — N390 Urinary tract infection, site not specified: Secondary | ICD-10-CM

## 2017-01-30 DIAGNOSIS — G40909 Epilepsy, unspecified, not intractable, without status epilepticus: Secondary | ICD-10-CM

## 2017-01-30 DIAGNOSIS — B1920 Unspecified viral hepatitis C without hepatic coma: Secondary | ICD-10-CM

## 2017-01-30 DIAGNOSIS — E1159 Type 2 diabetes mellitus with other circulatory complications: Secondary | ICD-10-CM

## 2017-01-30 LAB — RAPID URINE DRUG SCREEN, HOSP PERFORMED
Amphetamines: NOT DETECTED
BARBITURATES: NOT DETECTED
BENZODIAZEPINES: POSITIVE — AB
Cocaine: NOT DETECTED
Opiates: NOT DETECTED
Tetrahydrocannabinol: NOT DETECTED

## 2017-01-30 LAB — BASIC METABOLIC PANEL
ANION GAP: 7 (ref 5–15)
ANION GAP: 9 (ref 5–15)
BUN: 15 mg/dL (ref 6–20)
BUN: 15 mg/dL (ref 6–20)
CO2: 23 mmol/L (ref 22–32)
CO2: 22 mmol/L (ref 22–32)
Calcium: 8.6 mg/dL — ABNORMAL LOW (ref 8.9–10.3)
Calcium: 8.6 mg/dL — ABNORMAL LOW (ref 8.9–10.3)
Chloride: 111 mmol/L (ref 101–111)
Chloride: 110 mmol/L (ref 101–111)
Creatinine, Ser: 1.11 mg/dL (ref 0.61–1.24)
Creatinine, Ser: 1.09 mg/dL (ref 0.61–1.24)
GFR calc Af Amer: >60 mL/min (ref >60)
GLUCOSE: 97 mg/dL (ref 65–99)
Glucose, Bld: 102 mg/dL — ABNORMAL HIGH (ref 65–99)
POTASSIUM: 2.7 mmol/L — AB (ref 3.5–5.1)
POTASSIUM: 2.9 mmol/L — AB (ref 3.5–5.1)
SODIUM: 141 mmol/L (ref 135–145)
Sodium: 141 mmol/L (ref 135–145)

## 2017-01-30 LAB — GLUCOSE, CAPILLARY
GLUCOSE-CAPILLARY: 101 mg/dL — AB (ref 65–99)
GLUCOSE-CAPILLARY: 118 mg/dL — AB (ref 65–99)
GLUCOSE-CAPILLARY: 82 mg/dL (ref 65–99)
GLUCOSE-CAPILLARY: 98 mg/dL (ref 65–99)

## 2017-01-30 LAB — CBC
HEMATOCRIT: 35.3 % — AB (ref 39.0–52.0)
HEMOGLOBIN: 11.7 g/dL — AB (ref 13.0–17.0)
MCH: 30.2 pg (ref 26.0–34.0)
MCHC: 33.1 g/dL (ref 30.0–36.0)
MCV: 91 fL (ref 78.0–100.0)
Platelets: 165 10*3/uL (ref 150–400)
RBC: 3.88 MIL/uL — ABNORMAL LOW (ref 4.22–5.81)
RDW: 14.6 % (ref 11.5–15.5)
WBC: 7.3 10*3/uL (ref 4.0–10.5)

## 2017-01-30 LAB — CK TOTAL AND CKMB (NOT AT ARMC)
CK, MB: 13.6 ng/mL — ABNORMAL HIGH (ref 0.5–5.0)
RELATIVE INDEX: 0.1 (ref 0.0–2.5)
Total CK: 11794 U/L — ABNORMAL HIGH (ref 49–397)

## 2017-01-30 LAB — POTASSIUM: Potassium: 2.8 mmol/L — ABNORMAL LOW (ref 3.5–5.1)

## 2017-01-30 LAB — MAGNESIUM: Magnesium: 1.8 mg/dL (ref 1.7–2.4)

## 2017-01-30 LAB — PHOSPHORUS: PHOSPHORUS: 2.2 mg/dL — AB (ref 2.5–4.6)

## 2017-01-30 MED ORDER — CLONAZEPAM 0.5 MG PO TABS
0.5000 mg | ORAL_TABLET | Freq: Three times a day (TID) | ORAL | Status: DC | PRN
  Administered 2017-01-30 – 2017-02-01 (×6): 0.5 mg via ORAL
  Filled 2017-01-30 (×6): qty 1

## 2017-01-30 MED ORDER — POTASSIUM CHLORIDE 10 MEQ/100ML IV SOLN
10.0000 meq | INTRAVENOUS | Status: AC
  Administered 2017-01-30 (×3): 10 meq via INTRAVENOUS
  Filled 2017-01-30 (×3): qty 100

## 2017-01-30 MED ORDER — POTASSIUM PHOSPHATES 15 MMOLE/5ML IV SOLN
30.0000 mmol | Freq: Once | INTRAVENOUS | Status: AC
  Administered 2017-01-30: 30 mmol via INTRAVENOUS
  Filled 2017-01-30: qty 10

## 2017-01-30 MED ORDER — POTASSIUM CHLORIDE CRYS ER 20 MEQ PO TBCR
40.0000 meq | EXTENDED_RELEASE_TABLET | Freq: Two times a day (BID) | ORAL | Status: DC
  Administered 2017-01-30 – 2017-02-01 (×5): 40 meq via ORAL
  Filled 2017-01-30 (×5): qty 2

## 2017-01-30 MED ORDER — POLYETHYLENE GLYCOL 3350 17 G PO PACK: 17.0000 g | PACK | Freq: Every day | ORAL | Status: DC | PRN

## 2017-01-30 MED ORDER — DEXTROSE 5 % IV SOLN
1.0000 g | INTRAVENOUS | Status: DC
  Administered 2017-01-30 – 2017-01-31 (×2): 1 g via INTRAVENOUS
  Filled 2017-01-30 (×3): qty 10

## 2017-01-30 NOTE — Progress Notes (Signed)
PROGRESS NOTE    Terry Baxter  OEV:[id number] DOB: [birth year] DOA: 01/29/2017 PCP: Wallene Huh, MD   Brief Narrative:  Terry Baxter is a 71 y.o. male  with past medical history significant for anxiety, bipolar 1, low back pain, hepatitis C, history of heroin abuse, seizure disorder presents to the hospital with weakness. Patient states he was in his normal state of health until early today when he fell down and couldn't get back up. He states that he he became very shaky in his legs and then suddenly weak. He then fell to the floor. Denies hitting his head or loss of consciousness. Pt states he feels he was on the floor for approximately an hour. Patient's landlord happy to come by to find him on the floor. EMS reports patient was covered in feces and urine and there were bugs all over. Patient found to have multiple abrasions and these were covered with bacitracin, chest x-ray negative, hip x-ray negative. Hospitalist consulted for admission. Was found to be in Rhabdomyolysis and an Acute UTI.   Assessment & Plan:   Principal Problem:   Acute on chronic renal failure (HCC) Active Problems:   Hepatitis C   Cannabis abuse   Seizure disorder (HCC)   Bipolar disorder ([place])   Type 2 diabetes mellitus with other circulatory complications (HCC)   Essential hypertension   Hyperkalemia   Weakness   AKI (acute kidney injury) ([place])   Acute lower UTI   Insomnia  AKI on CKD Stage 2 -Baseline Cr 1.1, Cr on admit 1.41 -BUN/Cr went from 18/1.27 -> 15/1.11 -? 15/1.09 -1 L of normal saline given in the emergency room -C/w NS at 125 mL/hrs -Urine labs ordered to calculate fractional excretion of urea -Hold Hydrochlorothiazide -I&Os -We'll get bladder scan -Will defer kidney imaging for now since this is likely due to patient being on the floor for an unknown amount of time  Fall in the Setting of UTI -Head CT done and showed No acute intracranial abnormality. There was Atrophy with chronic  small vessel white matter ischemic disease -Physical Therapy consulted and Recommending Home Health PT -Checking CK and its worsening from 3,113 -> 11,794 -Check Urine Drug screen due to history of drug abuse; ws Positive for Benzos -Holding Roxicet  UTI -Patient's Urine Showed Few Bacteria, Amber Color, Moderate Leukocytes, and TNTC WBC -Urine Cx showed >100,000 CFU of E Coli -Based on Prior Urine Cx all Abx were sensitive -Will start on IV Ceftriaxone   Hypokalemia -Chronic Issue -Patient's K+ was 2.9 this AM -Replete with IV 30 mEQ, po 40 mEQ po BID, and IV KPhos 30 mmol  -Mag was 1.8 -Continue to Monitor and Replete as Necessary -Repeat CMP in AM  Rhabdomyolysis -Urinalysis showed Large Hgb and 0-5 RBC / HPF -CK worsened and went from 3113 -> 11,794 -CKMB was 13.6 -Continue with IVF with NS at 125 mL/hr   Weakness and Left Upper Extremity weakness -Likely due to multiple electrolyte abnormalities, could be due to Hydrochlorothiazide -Physical therapy eval and treat and recommending Home PT -Checking head CT-patient's weakness seems more global; Scan as above, -consider MRI if no improvement with treating of multitude of other issues -Holding Roxicet, Gabapentin -Continue to Monitor   Seizures -Continue Levetiracetam 500 mg po BID -Seizure Precautions -C/w IV Lorazepam 2 mg IV q4hprn for Seizures  Depression -No SI / HI -Continue Bupropion 300 mg po Daily and Risperidone 1 mg po TID  Anxiety -Continue Clonazepam 0.5 mg po TIDprn Anxiety  Insomnia -Hold Doxepin for now  Chronic Pain -Holding Gabapentin -When necessary Tylenol 650 mg po q6hprn  Hypertension -Hold Hydrochlorothiazide -C/w Hydralazine 10 mg IV q8hprn for SBP > 180 or DBP > 100  DM Mellitus Type 2 -C/w Resistant Novolog SSI  -CBG's ranging from 101-118  Hyperlipidemia -Hold Atorvastatin given worsening CK  Hypophosphatemia -Patient's Phos Level was 2.2 this AM -Replete with IV  KPhos 30 mmol -Continue to Monitor and Replete as Necessary -Repeat Phos in AM  Hx of Hepatitis C -S/p Treatment with Harvoni  DVT prophylaxis: Heparin 5,000 units sq q8h Code Status: FULL CODE Family Communication: No family present at bedside Disposition Plan: [place] PT when medically Stable for D/C   Consultants:   None   Procedures: None  Antimicrobials:  Anti-infectives    Start     Dose/Rate Route Frequency Ordered Stop   01/30/17 1800  cefTRIAXone (ROCEPHIN) 1 g in dextrose 5 % 50 mL IVPB     1 g 100 mL/hr over 30 Minutes Intravenous Every 24 hours 01/30/17 1629       Subjective: Seen and examined at bedside and he was complaining of back pain. Denied any CP or SOB. States he felt weak and his Right Arm was swollen because of IV infiltrated. No other concerns or complaints.   Objective: Vitals:   01/29/17 1850 01/29/17 2035 01/30/17 0434 01/30/17 0920  BP: 120/70 (!) 120/58 105/63 114/72  Pulse: 87 78 74 83  Resp: 17 18 18 18   Temp: 98.6 F (37 C) 98.1 F (36.7 C) 98.8 F (37.1 C) 98.5 F (36.9 C)  TempSrc: Oral Oral Oral Oral  SpO2: 98% 100% 99% 99%  Weight:  114.5 kg (252 lb 6.8 oz)    Height:        Intake/Output Summary (Last 24 hours) at 01/30/17 1654 Last data filed at 01/30/17 1300  Gross per 24 hour  Intake          1478.33 ml  Output              475 ml  Net          1003.33 ml   Filed Weights   01/29/17 1118 01/29/17 1555 01/29/17 2035  Weight: 113.4 kg (250 lb) 113.6 kg (250 lb 8 oz) 114.5 kg (252 lb 6.8 oz)   Examination: Physical Exam:  Constitutional: WN/WD obese AAM who is in NAD and appears calm and comfortable Eyes: Lids and conjunctivae normal, sclerae anicteric  ENMT: External Ears, Nose appear normal. Grossly normal hearing. Mucous membranes are moist. No visible JVD Neck: Appears normal, supple, no cervical masses, normal ROM, no appreciable thyromegaly Respiratory: Clear to auscultation bilaterally, no wheezing, rales,  rhonchi or crackles. Normal respiratory effort and patient is not tachypenic. No accessory muscle use.  Cardiovascular: RRR, no murmurs / rubs / gallops. S1 and S2 auscultated. Milde Lower extremity swelling. Right Arm swollen from infiltrated IV Abdomen: Soft, non-tender, non-distended. No masses palpated. No appreciable hepatosplenomegaly. Bowel sounds positive x4. Abdominal incisions are healing well.  GU: Deferred. Musculoskeletal: No clubbing / cyanosis of digits/nails. No contractures. Skin: No rashes but has some abrasions. No induration; Warm and dry.  Neurologic: CN 2-12 grossly intact with no focal deficits.  Romberg sign cerebellar reflexes not assessed.  Psychiatric: Normal judgment and insight. Alert and oriented x 3. Normal mood and appropriate affect.   Data Reviewed: I have personally reviewed following labs and imaging studies  CBC:  Recent Labs Lab 01/29/17 1159 01/30/17 0451  WBC 11.0* 7.3  NEUTROABS 8.9*  --   HGB 13.7 11.7*  HCT 40.1 35.3*  MCV 90.5 91.0  PLT 199 573   Basic Metabolic Panel:  Recent Labs Lab 01/29/17 1159 01/29/17 1925 01/30/17 0451 01/30/17 0653 01/30/17 0830  NA 140 137 141  --  141  K 2.4* 3.0* 2.7* 2.8* 2.9*  CL 105 108 111  --  110  CO2 25 22 23   --  22  GLUCOSE 129* 103* 97  --  102*  BUN 17 18 15   --  15  CREATININE 1.41* 1.27* 1.11  --  1.09  CALCIUM 9.5 8.4* 8.6*  --  8.6*  MG 1.8  --  1.8  --   --   PHOS 1.2*  --  2.2*  --   --    GFR: Estimated Creatinine Clearance: 78.5 mL/min (by C-G formula based on SCr of 1.09 mg/dL). Liver Function Tests:  Recent Labs Lab 01/29/17 1159  AST 58*  ALT 22  ALKPHOS 65  BILITOT 0.9  PROT 7.4  ALBUMIN 3.9   No results for input(s): LIPASE, AMYLASE in the last 168 hours. No results for input(s): AMMONIA in the last 168 hours. Coagulation Profile:  Recent Labs Lab 01/29/17 1159  INR 1.13   Cardiac Enzymes:  Recent Labs Lab 01/29/17 1430 01/30/17 0830  CKTOTAL 3,113*  11,794*  CKMB  --  13.6*   BNP (last 3 results) No results for input(s): PROBNP in the last 8760 hours. HbA1C: No results for input(s): HGBA1C in the last 72 hours. CBG:  Recent Labs Lab 01/29/17 1652 01/30/17 0758 01/30/17 1147 01/30/17 1642  GLUCAP 98 101* 118* 82   Lipid Profile: No results for input(s): CHOL, HDL, LDLCALC, TRIG, CHOLHDL, LDLDIRECT in the last 72 hours. Thyroid Function Tests: No results for input(s): TSH, T4TOTAL, FREET4, T3FREE, THYROIDAB in the last 72 hours. Anemia Panel: No results for input(s): VITAMINB12, FOLATE, FERRITIN, TIBC, IRON, RETICCTPCT in the last 72 hours. Sepsis Labs:  Recent Labs Lab 01/29/17 1213  LATICACIDVEN 1.64    Recent Results (from the past 240 hour(s))  Urine culture     Status: Abnormal (Preliminary result)   Collection Time: 01/29/17  2:28 PM  Result Value Ref Range Status   Specimen Description URINE, CLEAN CATCH  Final   Special Requests unknown Normal  Final   Culture (A)  Final    >=100,000 COLONIES/mL ESCHERICHIA COLI SUSCEPTIBILITIES TO FOLLOW    Report Status PENDING  Incomplete     Radiology Studies: Ct Head Wo Contrast  Result Date: 01/29/2017 CLINICAL DATA:  Upper extremity weakness. EXAM: CT HEAD WITHOUT CONTRAST TECHNIQUE: Contiguous axial images were obtained from the base of the skull through the vertex without intravenous contrast. COMPARISON:  06/23/2016 FINDINGS: Brain: There is no evidence for acute hemorrhage, hydrocephalus, mass lesion, or abnormal extra-axial fluid collection. No definite CT evidence for acute infarction. Diffuse loss of parenchymal volume is consistent with atrophy. Patchy low attenuation in the deep hemispheric and periventricular white matter is nonspecific, but likely reflects chronic microvascular ischemic demyelination. 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 Vascular: No hyperdense vessel or unexpected calcification. Skull: No evidence for fracture. No  worrisome lytic or sclerotic lesion. Sinuses/Orbits: The visualized paranasal sinuses and mastoid air cells are clear. Visualized portions of the globes and intraorbital fat are unremarkable. Other: None. IMPRESSION: 1. No acute intracranial abnormality. 2. Atrophy with chronic small vessel white matter ischemic disease. Electronically Signed   By: Randall Hiss  Tery Sanfilippo M.D.   On: 01/29/2017 19:58   Dg Chest Port 1 View  Result Date: 01/29/2017 CLINICAL DATA:  Found on the floor.  Fall. EXAM: PORTABLE CHEST 1 VIEW COMPARISON:  06/23/2006 FINDINGS: Heart and mediastinal contours are within normal limits. No focal opacities or effusions. No acute bony abnormality. IMPRESSION: No active disease. Electronically Signed   By: Rolm Baptise M.D.   On: 01/29/2017 12:06   Dg Hip Unilat W Or Wo Pelvis 2-3 Views Left  Result Date: 01/29/2017 FALL CLINICAL DATA: . EXAM: DG HIP (WITH OR WITHOUT PELVIS) 2-3V LEFT COMPARISON:  06/23/2016 FINDINGS: Mild symmetric degenerative changes in the hips. SI joint symmetric and unremarkable. No acute bony abnormality. Specifically, no fracture, subluxation, or dislocation. Soft tissues are intact. IMPRESSION: No acute bony abnormality. Electronically Signed   By: Rolm Baptise M.D.   On: 01/29/2017 12:07   Scheduled Meds: . bacitracin   Topical BID  . buPROPion  300 mg Oral Q breakfast  . heparin  5,000 Units Subcutaneous Q8H  . insulin aspart  0-20 Units Subcutaneous TID WC  . levETIRAcetam  500 mg Oral BID  . multivitamin with minerals  1 tablet Oral Daily  . polyvinyl alcohol  1 drop Both Eyes TID  . potassium chloride  40 mEq Oral BID  . risperiDONE  1 mg Oral TID  . sodium chloride flush  3 mL Intravenous Q12H   Continuous Infusions: . sodium chloride 125 mL/hr at 01/30/17 0008  . cefTRIAXone (ROCEPHIN)  IV      LOS: 1 day   Kerney Elbe, DO [facility] Pager [phone]  If 7PM-7AM, please contact night-coverage [email or web address] Password  [email or web address] 01/30/2017, 4:54 PM

## 2017-01-30 NOTE — Plan of Care (Signed)
Problem: Safety: Goal: Ability to remain free from injury will improve Outcome: Progressing Importance of calling for assistance when attempting to ambulate stressed to patient.  Patient verbalized understanding.  Correct use of call bell demonstrated by patient.  Patient progressing towards goal.

## 2017-01-30 NOTE — Progress Notes (Signed)
K = 2.7, MD notified.

## 2017-01-30 NOTE — Evaluation (Signed)
Physical Therapy Evaluation Patient Details Name: Terry Baxter MRN: [id number] DOB: 06-04-1946 Today's Date: 01/30/2017   History of Present Illness  Pt is a 71 yo male admitted through ED on 01/29/17 following a fall at home. Pt was recently discharged from [facility] Pt was diagnosed with an AKI and was noted to have hypokalemia which is being replaced. PMH significant for anxiety, bipolar, HTN, heroin abuse, CKD, DJD, siezure d/p, Hep C.   Clinical Impression  Pt presents with the above diagnosis and below deficits for therapy evaluation. Prior to admission, pt had just returned home from a SNF stay following a previous admission. [place]. Pt requires Min A to Min guard for all mobility this session and is able to perform gait with RW with good sequencing and cadence. Pt will benefit from continued acute PT services in order to address the below deficits before D/C to venue recommended below.     Follow Up Recommendations Home health PT    Equipment Recommendations  None recommended by PT    Recommendations for Other Services       Precautions / Restrictions Precautions Precautions: Fall Restrictions Weight Bearing Restrictions: No      Mobility  Bed Mobility Overal bed mobility: Needs Assistance Bed Mobility: Supine to Sit;Sit to Supine     Supine to sit: Min guard;HOB elevated Sit to supine: Min assist   General bed mobility comments: Min guard with increased time required to get EOB, MIn A for supine to sit to adjust  Transfers Overall transfer level: Needs assistance Equipment used: Rolling walker (2 wheeled) Transfers: Sit to/from Stand Sit to Stand: Min guard         General transfer comment: cues for safety, UE placement  Ambulation/Gait Ambulation/Gait assistance: Min guard;Supervision Ambulation Distance (Feet): 150 Feet Assistive device: Rolling walker (2 wheeled) Gait  Pattern/deviations: Step-through pattern;Wide base of support Gait velocity: approaching normal Gait velocity interpretation: at or above normal speed for age/gender General Gait Details: cues for RW position, posture and safety with turns  Stairs            Wheelchair Mobility    Modified Rankin (Stroke Patients Only)       Balance Overall balance assessment: Needs assistance;History of Falls Sitting-balance support: No upper extremity supported;Feet supported Sitting balance-Leahy Scale: Good     Standing balance support: Bilateral upper extremity supported Standing balance-Leahy Scale: Poor Standing balance comment: reliant on UEs for support                             Pertinent Vitals/Pain Pain Assessment: Faces Faces Pain Scale: Hurts a little bit Pain Location: RUE Pain Descriptors / Indicators: Guarding Pain Intervention(s): Monitored during session;Repositioned    Home Living Family/patient expects to be discharged to:: Private residence Living Arrangements: Alone;Other (Comment) (plans to go home with uncle) Available Help at Discharge: Family Type of Home: House Home Access: Stairs to enter Entrance Stairs-Rails: Psychiatric nurse of Steps: 2 Home Layout: One level Home Equipment: Cane - single point;Walker - 2 wheels      Prior Function Level of Independence: Independent with assistive device(s)         Comments: was using a cane for mobility prior to admission     Hand Dominance   Dominant Hand: Right    Extremity/Trunk Assessment   Upper Extremity Assessment Upper Extremity Assessment:  Defer to OT evaluation    Lower Extremity Assessment Lower Extremity Assessment: Generalized weakness    Cervical / Trunk Assessment Cervical / Trunk Assessment: Normal  Communication   Communication: No difficulties  Cognition Arousal/Alertness: Awake/alert Behavior During Therapy: Flat affect Overall Cognitive  Status: No family/caregiver present to determine baseline cognitive functioning Area of Impairment: Following commands                       Following Commands: Follows one step commands with increased time;Follows multi-step commands with increased time       General Comments: cues for safety      General Comments General comments (skin integrity, edema, etc.): Increased edema noted to RUE from previous IV, abrasions on chin and LE's noted    Exercises     Assessment/Plan    PT Assessment Patient needs continued PT services  PT Problem List Decreased strength;Decreased activity tolerance;Decreased balance;Decreased mobility       PT Treatment Interventions DME instruction;Gait training;Functional mobility training;Therapeutic activities;Therapeutic exercise;Patient/family education    PT Goals (Current goals can be found in the Care Plan section)  Acute Rehab PT Goals Patient Stated Goal: to go home with his uncle PT Goal Formulation: With patient Time For Goal Achievement: 02/13/17 Potential to Achieve Goals: Good    Frequency Min 3X/week   Barriers to discharge        Co-evaluation               AM-PAC PT "6 Clicks" Daily Activity  Outcome Measure Difficulty turning over in bed (including adjusting bedclothes, sheets and blankets)?: None Difficulty moving from lying on back to sitting on the side of the bed? : A Little Difficulty sitting down on and standing up from a chair with arms (e.g., wheelchair, bedside commode, etc,.)?: Total Help needed moving to and from a bed to chair (including a wheelchair)?: A Little Help needed walking in hospital room?: A Little Help needed climbing 3-5 steps with a railing? : A Lot 6 Click Score: 16    End of Session Equipment Utilized During Treatment: Gait belt Activity Tolerance: Patient tolerated treatment well Patient left: in bed;with call bell/phone within reach;with bed alarm set;with nursing/sitter in  room Nurse Communication: Mobility status PT Visit Diagnosis: Difficulty in walking, not elsewhere classified (R26.2)    Time: 5625-6389 PT Time Calculation (min) (ACUTE ONLY): 27 min   Charges:   PT Evaluation $PT Eval Moderate Complexity: 1 Procedure PT Treatments $Gait Training: 8-22 mins   PT G Codes:        Scheryl Marten PT, DPT  [phone]   Jacqulyn Liner Sloan Leiter 01/30/2017, 3:50 PM

## 2017-01-31 DIAGNOSIS — N182 Chronic kidney disease, stage 2 (mild): Secondary | ICD-10-CM

## 2017-01-31 DIAGNOSIS — R7401 Elevation of levels of liver transaminase levels: Secondary | ICD-10-CM

## 2017-01-31 DIAGNOSIS — E876 Hypokalemia: Secondary | ICD-10-CM

## 2017-01-31 DIAGNOSIS — R740 Nonspecific elevation of levels of transaminase and lactic acid dehydrogenase [LDH]: Secondary | ICD-10-CM

## 2017-01-31 DIAGNOSIS — B9620 Unspecified Escherichia coli [E. coli] as the cause of diseases classified elsewhere: Secondary | ICD-10-CM

## 2017-01-31 LAB — CBC WITH DIFFERENTIAL/PLATELET
BASOS ABS: 0 10*3/uL (ref 0.0–0.1)
Basophils Relative: 0 %
EOS PCT: 3 %
Eosinophils Absolute: 0.2 10*3/uL (ref 0.0–0.7)
HEMATOCRIT: 34.1 % — AB (ref 39.0–52.0)
Hemoglobin: 11.4 g/dL — ABNORMAL LOW (ref 13.0–17.0)
LYMPHS PCT: 23 %
Lymphs Abs: 1.9 10*3/uL (ref 0.7–4.0)
MCH: 30.5 pg (ref 26.0–34.0)
MCHC: 33.4 g/dL (ref 30.0–36.0)
MCV: 91.2 fL (ref 78.0–100.0)
Monocytes Absolute: 0.7 10*3/uL (ref 0.1–1.0)
Monocytes Relative: 9 %
NEUTROS ABS: 5.4 10*3/uL (ref 1.7–7.7)
Neutrophils Relative %: 65 %
PLATELETS: 167 10*3/uL (ref 150–400)
RBC: 3.74 MIL/uL — AB (ref 4.22–5.81)
RDW: 14.6 % (ref 11.5–15.5)
WBC: 8.2 10*3/uL (ref 4.0–10.5)

## 2017-01-31 LAB — URINE CULTURE
Culture: >=100000 — AB
SPECIAL REQUESTS: NORMAL

## 2017-01-31 LAB — COMPREHENSIVE METABOLIC PANEL
ALBUMIN: 2.7 g/dL — AB (ref 3.5–5.0)
ALT: 32 U/L (ref 17–63)
AST: 135 U/L — AB (ref 15–41)
Alkaline Phosphatase: 53 U/L (ref 38–126)
Anion gap: 7 (ref 5–15)
BUN: 9 mg/dL (ref 6–20)
CHLORIDE: 112 mmol/L — AB (ref 101–111)
CO2: 23 mmol/L (ref 22–32)
CREATININE: 0.98 mg/dL (ref 0.61–1.24)
Calcium: 8.1 mg/dL — ABNORMAL LOW (ref 8.9–10.3)
GFR calc Af Amer: >60 mL/min (ref >60)
GLUCOSE: 90 mg/dL (ref 65–99)
Potassium: 3.3 mmol/L — ABNORMAL LOW (ref 3.5–5.1)
Sodium: 142 mmol/L (ref 135–145)
Total Bilirubin: 0.6 mg/dL (ref 0.3–1.2)
Total Protein: 5.9 g/dL — ABNORMAL LOW (ref 6.5–8.1)

## 2017-01-31 LAB — CK TOTAL AND CKMB (NOT AT ARMC)
CK, MB: 7.6 ng/mL — AB (ref 0.5–5.0)
RELATIVE INDEX: 0.1 (ref 0.0–2.5)
Total CK: 6916 U/L — ABNORMAL HIGH (ref 49–397)

## 2017-01-31 LAB — GLUCOSE, CAPILLARY
GLUCOSE-CAPILLARY: 95 mg/dL (ref 65–99)
Glucose-Capillary: 99 mg/dL (ref 65–99)
Glucose-Capillary: 86 mg/dL (ref 65–99)
Glucose-Capillary: 77 mg/dL (ref 65–99)

## 2017-01-31 LAB — PHOSPHORUS: Phosphorus: 3 mg/dL (ref 2.5–4.6)

## 2017-01-31 LAB — MAGNESIUM: MAGNESIUM: 1.8 mg/dL (ref 1.7–2.4)

## 2017-01-31 MED ORDER — FOLIC ACID 1 MG PO TABS
1.0000 mg | ORAL_TABLET | Freq: Every day | ORAL | Status: DC
  Administered 2017-01-31 – 2017-02-01 (×2): 1 mg via ORAL
  Filled 2017-01-31 (×2): qty 1

## 2017-01-31 MED ORDER — POTASSIUM CHLORIDE 10 MEQ/100ML IV SOLN
10.0000 meq | INTRAVENOUS | Status: AC
  Administered 2017-01-31 (×3): 10 meq via INTRAVENOUS
  Filled 2017-01-31 (×2): qty 100

## 2017-01-31 MED ORDER — ADULT MULTIVITAMIN W/MINERALS CH
1.0000 | ORAL_TABLET | Freq: Every day | ORAL | Status: DC
  Administered 2017-01-31 – 2017-02-01 (×2): 1 via ORAL
  Filled 2017-01-31 (×2): qty 1

## 2017-01-31 MED ORDER — LORAZEPAM 1 MG PO TABS: 1.0000 mg | ORAL_TABLET | Freq: Four times a day (QID) | ORAL | Status: DC | PRN

## 2017-01-31 MED ORDER — LORAZEPAM 2 MG/ML IJ SOLN: 1.0000 mg | Freq: Four times a day (QID) | INTRAMUSCULAR | Status: DC | PRN

## 2017-01-31 MED ORDER — LIDOCAINE 5 % EX PTCH
1.0000 | MEDICATED_PATCH | CUTANEOUS | Status: DC
  Administered 2017-01-31: 1 via TRANSDERMAL
  Filled 2017-01-31: qty 1

## 2017-01-31 MED ORDER — SENNOSIDES-DOCUSATE SODIUM 8.6-50 MG PO TABS
1.0000 | ORAL_TABLET | Freq: Two times a day (BID) | ORAL | Status: DC
  Administered 2017-01-31 – 2017-02-01 (×3): 1 via ORAL
  Filled 2017-01-31 (×3): qty 1

## 2017-01-31 MED ORDER — THIAMINE HCL 100 MG/ML IJ SOLN: 100.0000 mg | Freq: Every day | INTRAMUSCULAR | Status: DC

## 2017-01-31 MED ORDER — LORAZEPAM 2 MG/ML IJ SOLN: 0.0000 mg | Freq: Two times a day (BID) | INTRAMUSCULAR | Status: DC

## 2017-01-31 MED ORDER — VITAMIN B-1 100 MG PO TABS
100.0000 mg | ORAL_TABLET | Freq: Every day | ORAL | Status: DC
  Administered 2017-01-31 – 2017-02-01 (×2): 100 mg via ORAL
  Filled 2017-01-31 (×2): qty 1

## 2017-01-31 MED ORDER — LORAZEPAM 2 MG/ML IJ SOLN
0.0000 mg | Freq: Four times a day (QID) | INTRAMUSCULAR | Status: DC
  Administered 2017-02-01: 2 mg via INTRAVENOUS
  Filled 2017-01-31: qty 1

## 2017-01-31 NOTE — Progress Notes (Signed)
PROGRESS NOTE    Terry Baxter  AJO:[id number] DOB: [birth year] DOA: 01/29/2017 PCP: Wallene Huh, MD   Brief Narrative:  Terry Baxter is a 71 y.o. male  with past medical history significant for anxiety, bipolar 1, low back pain, hepatitis C, history of heroin abuse, seizure disorder presents to the hospital with weakness. Patient states he was in his normal state of health until early today when he fell down and couldn't get back up. He states that he he became very shaky in his legs and then suddenly weak. He then fell to the floor. Denies hitting his head or loss of consciousness. Pt states he feels he was on the floor for approximately an hour. Patient's landlord happy to come by to find him on the floor. EMS reports patient was covered in feces and urine and there were bugs all over. Patient found to have multiple abrasions and these were covered with bacitracin, chest x-ray negative, hip x-ray negative. Hospitalist consulted for admission. Was found to be in Rhabdomyolysis and an Acute UTI. Patient's CK is improving and he is feeling better but still complaining of some back pain.   Assessment & Plan:   Principal Problem:   Acute on chronic renal failure (HCC) Active Problems:   Hepatitis C   Cannabis abuse   Seizure disorder (HCC)   Bipolar disorder ([place])   Type 2 diabetes mellitus with other circulatory complications (HCC)   Essential hypertension   Hyperkalemia   Weakness   AKI (acute kidney injury) ([place])   Acute lower UTI   Insomnia  AKI on CKD Stage 2 -Baseline Cr 1.1, Cr on admit 1.41 -BUN/Cr went from 18/1.27 -> 15/1.11 -> 15/1.09 -> 9/0.98 -1 L of normal saline given in the emergency room -C/w NS at 75 mL/hrs (Rate reduced) -Urine labs ordered to calculate fractional excretion of urea -Continue to Hold Hydrochlorothiazide -I&Os; Patient is +2.188 L -We'll get bladder scan -Will defer kidney imaging for now since this is likely due to patient being on the floor for  an unknown amount of time  Fall in the Setting of UTI -Head CT done and showed No acute intracranial abnormality. There was Atrophy with chronic small vessel white matter ischemic disease -Physical Therapy consulted and Recommending Home Health PT -Checking CK and it went from 3,113 -> 11,794 -> 6,916 -Check Urine Drug screen due to history of drug abuse; ws Positive for Benzos -Continue to Hold Roxicet  E Coli UTI -Patient's Urine Showed Few Bacteria, Amber Color, Moderate Leukocytes, and TNTC WBC -Urine Cx showed >100,000 CFU of E Coli -Based on Prior Urine Cx all Abx were sensitive and this Urine Cx was again pansensitive -C/w IV Ceftriaxone and transition to po Abx in AM  Hypokalemia -Chronic Issue -Patient's K+ was 3.3 this AM -Replete with IV 30 mEQ and  po 40 mEQ po BID, -Mag was 1.8 -Continue to Monitor and Replete as Necessary -Repeat CMP in AM  Rhabdomyolysis -Urinalysis showed Large Hgb and 0-5 RBC / HPF -CK worsened and went from 3113 -> 11,794 -> 6,916 -CKMB was 13.6 and trended down to 7.6 -Continue with IVF with NS at 75 mL/hr (decreased from 125 mL/hr)  Weakness and Left Upper Extremity weakness -Likely due to multiple electrolyte abnormalities, could be due to Hydrochlorothiazide -Physical therapy evaluated and recommending Home PT -Checking head CT-patient's weakness seems more global; Scan as above,  -Will consider MRI if no improvement with treating of multitude of other issues -Holding Roxicet, Gabapentin -Continue to Monitor  Seizures -Continue Levetiracetam 500 mg po BID -Seizure Precautions -C/w IV Lorazepam 2 mg IV q4hprn for Seizures  Depression -No SI / HI -Continue Bupropion 300 mg po Daily and Risperidone 1 mg po TID  Anxiety -Continue Clonazepam 0.5 mg po TIDprn Anxiety   Insomnia -Hold Doxepin for now  Chronic Pain -Holding Gabapentin for now due to AKI on CKD -When necessary Tylenol 650 mg po q6hprn -Added Lidocaine Patch  for Back Pain  Hypertension -Hold Hydrochlorothiazide -C/w Hydralazine 10 mg IV q8hprn for SBP > 180 or DBP > 100  DM Mellitus Type 2 -C/w Resistant Novolog SSI  -CBG's ranging from 82-99  Hyperlipidemia -Hold Atorvastatin given elevation in CK  Hypophosphatemia -Patient's Phos Level was 2.2 yesterday and improved to 3.0 -Replete with IV KPhos 30 mmol yesterday -Continue to Monitor and Replete as Necessary -Repeat Phos in AM  Hx of Hepatitis C -S/p Treatment with Harvoni -AST was 135 and ALT was 32 -Continue to Monitor and repeat CMP  Elevated AST -AST was approximately 4x ALT Value -? EtOH -Will start CIWA Protocol with Lorazepam for possible Withdrawls (Given his Substance Abuse Hx) -Repeat CMP in AM -EtOH level was not checked on Admission -If remains elevated will consider RUQ U/S  DVT prophylaxis: Heparin 5,000 units sq q8h Code Status: FULL CODE Family Communication: No family present at bedside Disposition Plan: Home Health PT when medically Stable for D/C likely in the next 24-48 hours  Consultants:   None   Procedures: None  Antimicrobials:  Anti-infectives    Start     Dose/Rate Route Frequency Ordered Stop   01/30/17 1800  cefTRIAXone (ROCEPHIN) 1 g in dextrose 5 % 50 mL IVPB     1 g 100 mL/hr over 30 Minutes Intravenous Every 24 hours 01/30/17 1629       Subjective: Seen and examined at bedside and he felt better and didn't realize he had a UTI. Still complained of some back pain. No nausea or vomiting. No other concerns or complaints at this time.   Objective: Vitals:   01/30/17 1802 01/30/17 2042 01/31/17 0448 01/31/17 0919  BP: (!) 111/59 125/66 123/77 116/70  Pulse: 89 79 82 84  Resp: 18 18 17 18   Temp: 98.3 F (36.8 C) 98.7 F (37.1 C) 98.4 F (36.9 C) 98.4 F (36.9 C)  TempSrc: Oral Oral  Oral  SpO2: 98% 97% 97% 97%  Weight:  116.1 kg (256 lb)    Height:  5\' 8"  (1.727 m)      Intake/Output Summary (Last 24 hours) at  01/31/17 1630 Last data filed at 01/31/17 1453  Gross per 24 hour  Intake             2210 ml  Output             2125 ml  Net               85 ml   Filed Weights   01/29/17 1555 01/29/17 2035 01/30/17 2042  Weight: 113.6 kg (250 lb 8 oz) 114.5 kg (252 lb 6.8 oz) 116.1 kg (256 lb)   Examination: Physical Exam:  Constitutional: Pleasant obese AAM who is WN/WD and in NAD appears calm and comfortable Eyes: Sclerae anicteric; Conjunctivae non-injected ENMT: Grossly normal hearing. External ears and nose appear normal Neck: Supple with No JVD Respiratory: CTAB; No wheezing/rales/rhonchi. Patient was not tachypenic or using any accessory muscles to breathe Cardiovascular: RRR; S1 S2; No appreciable Lower extremity edema Abdomen: Soft, NT, Distended  due to body habitus GU: Deferred Musculoskeletal: No contractures; No cyanosis Skin: Warm and Dry. No rashes or lesions on a limited skin eval Neurologic: CN 2-12 grossly intact. No focal deficits Psychiatric: Normal mood and affect. Intact Judgement and insight  Data Reviewed: I have personally reviewed following labs and imaging studies  CBC:  Recent Labs Lab 01/29/17 1159 01/30/17 0451 01/31/17 0556  WBC 11.0* 7.3 8.2  NEUTROABS 8.9*  --  5.4  HGB 13.7 11.7* 11.4*  HCT 40.1 35.3* 34.1*  MCV 90.5 91.0 91.2  PLT 199 165 563   Basic Metabolic Panel:  Recent Labs Lab 01/29/17 1159 01/29/17 1925 01/30/17 0451 01/30/17 0653 01/30/17 0830 01/31/17 0556  NA 140 137 141  --  141 142  K 2.4* 3.0* 2.7* 2.8* 2.9* 3.3*  CL 105 108 111  --  110 112*  CO2 25 22 23   --  22 23  GLUCOSE 129* 103* 97  --  102* 90  BUN 17 18 15   --  15 9  CREATININE 1.41* 1.27* 1.11  --  1.09 0.98  CALCIUM 9.5 8.4* 8.6*  --  8.6* 8.1*  MG 1.8  --  1.8  --   --  1.8  PHOS 1.2*  --  2.2*  --   --  3.0   GFR: Estimated Creatinine Clearance: 88 mL/min (by C-G formula based on SCr of 0.98 mg/dL). Liver Function Tests:  Recent Labs Lab  01/29/17 1159 01/31/17 0556  AST 58* 135*  ALT 22 32  ALKPHOS 65 53  BILITOT 0.9 0.6  PROT 7.4 5.9*  ALBUMIN 3.9 2.7*   No results for input(s): LIPASE, AMYLASE in the last 168 hours. No results for input(s): AMMONIA in the last 168 hours. Coagulation Profile:  Recent Labs Lab 01/29/17 1159  INR 1.13   Cardiac Enzymes:  Recent Labs Lab 01/29/17 1430 01/30/17 0830 01/31/17 0846  CKTOTAL 3,113* 11,794* 6,916*  CKMB  --  13.6* 7.6*   BNP (last 3 results) No results for input(s): PROBNP in the last 8760 hours. HbA1C: No results for input(s): HGBA1C in the last 72 hours. CBG:  Recent Labs Lab 01/30/17 1147 01/30/17 1642 01/30/17 2047 01/31/17 0755 01/31/17 1232  GLUCAP 118* 82 98 99 95   Lipid Profile: No results for input(s): CHOL, HDL, LDLCALC, TRIG, CHOLHDL, LDLDIRECT in the last 72 hours. Thyroid Function Tests: No results for input(s): TSH, T4TOTAL, FREET4, T3FREE, THYROIDAB in the last 72 hours. Anemia Panel: No results for input(s): VITAMINB12, FOLATE, FERRITIN, TIBC, IRON, RETICCTPCT in the last 72 hours. Sepsis Labs:  Recent Labs Lab 01/29/17 1213  LATICACIDVEN 1.64    Recent Results (from the past 240 hour(s))  Urine culture     Status: Abnormal   Collection Time: 01/29/17  2:28 PM  Result Value Ref Range Status   Specimen Description URINE, CLEAN CATCH  Final   Special Requests unknown Normal  Final   Culture >=100,000 COLONIES/mL ESCHERICHIA COLI (A)  Final   Report Status 01/31/2017 FINAL  Final   Organism ID, Bacteria ESCHERICHIA COLI (A)  Final      Susceptibility   Escherichia coli - MIC*    AMPICILLIN 4 SENSITIVE Sensitive     CEFAZOLIN <=4 SENSITIVE Sensitive     CEFTRIAXONE <=1 SENSITIVE Sensitive     CIPROFLOXACIN <=0.25 SENSITIVE Sensitive     GENTAMICIN <=1 SENSITIVE Sensitive     IMIPENEM <=0.25 SENSITIVE Sensitive     NITROFURANTOIN <=16 SENSITIVE Sensitive     TRIMETH/SULFA <=  20 SENSITIVE Sensitive      AMPICILLIN/SULBACTAM <=2 SENSITIVE Sensitive     PIP/TAZO <=4 SENSITIVE Sensitive     Extended ESBL NEGATIVE Sensitive     * >=100,000 COLONIES/mL ESCHERICHIA COLI     Radiology Studies: Ct Head Wo Contrast  Result Date: 01/29/2017 CLINICAL DATA:  Upper extremity weakness. EXAM: CT HEAD WITHOUT CONTRAST TECHNIQUE: Contiguous axial images were obtained from the base of the skull through the vertex without intravenous contrast. COMPARISON:  06/23/2016 FINDINGS: Brain: There is no evidence for acute hemorrhage, hydrocephalus, mass lesion, or abnormal extra-axial fluid collection. No definite CT evidence for acute infarction. Diffuse loss of parenchymal volume is consistent with atrophy. Patchy low attenuation in the deep hemispheric and periventricular white matter is nonspecific, but likely reflects chronic microvascular ischemic demyelination. 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 Vascular: No hyperdense vessel or unexpected calcification. Skull: No evidence for fracture. No worrisome lytic or sclerotic lesion. Sinuses/Orbits: The visualized paranasal sinuses and mastoid air cells are clear. Visualized portions of the globes and intraorbital fat are unremarkable. Other: None. IMPRESSION: 1. No acute intracranial abnormality. 2. Atrophy with chronic small vessel white matter ischemic disease. Electronically Signed   By: Misty Stanley M.D.   On: 01/29/2017 19:58   Scheduled Meds: . bacitracin   Topical BID  . buPROPion  300 mg Oral Q breakfast  . heparin  5,000 Units Subcutaneous Q8H  . insulin aspart  0-20 Units Subcutaneous TID WC  . levETIRAcetam  500 mg Oral BID  . multivitamin with minerals  1 tablet Oral Daily  . polyvinyl alcohol  1 drop Both Eyes TID  . potassium chloride  40 mEq Oral BID  . risperiDONE  1 mg Oral TID  . senna-docusate  1 tablet Oral BID  . sodium chloride flush  3 mL Intravenous Q12H   Continuous Infusions: . sodium chloride 125 mL/hr at 01/31/17  0153  . cefTRIAXone (ROCEPHIN)  IV Stopped (01/30/17 2345)    LOS: 2 days   Kerney Elbe, DO [facility] Pager [phone]  If 7PM-7AM, please contact night-coverage [email or web address] Password [email or web address] 01/31/2017, 4:30 PM

## 2017-02-01 DIAGNOSIS — I639 Cerebral infarction, unspecified: Secondary | ICD-10-CM

## 2017-02-01 LAB — CBC WITH DIFFERENTIAL/PLATELET
BASOS ABS: 0 10*3/uL (ref 0.0–0.1)
BASOS PCT: 0 %
Eosinophils Absolute: 0.2 10*3/uL (ref 0.0–0.7)
Eosinophils Relative: 3 %
HEMATOCRIT: 38.1 % — AB (ref 39.0–52.0)
Hemoglobin: 12.4 g/dL — ABNORMAL LOW (ref 13.0–17.0)
Lymphocytes Relative: 26 %
Lymphs Abs: 1.9 10*3/uL (ref 0.7–4.0)
MCH: 30.2 pg (ref 26.0–34.0)
MCHC: 32.5 g/dL (ref 30.0–36.0)
MCV: 92.7 fL (ref 78.0–100.0)
MONO ABS: 0.7 10*3/uL (ref 0.1–1.0)
Monocytes Relative: 9 %
NEUTROS ABS: 4.5 10*3/uL (ref 1.7–7.7)
NEUTROS PCT: 62 %
Platelets: 178 10*3/uL (ref 150–400)
RBC: 4.11 MIL/uL — ABNORMAL LOW (ref 4.22–5.81)
RDW: 14.9 % (ref 11.5–15.5)
WBC: 7.4 10*3/uL (ref 4.0–10.5)

## 2017-02-01 LAB — COMPREHENSIVE METABOLIC PANEL
ALBUMIN: 2.9 g/dL — AB (ref 3.5–5.0)
ALT: 33 U/L (ref 17–63)
AST: 104 U/L — AB (ref 15–41)
Alkaline Phosphatase: 61 U/L (ref 38–126)
Anion gap: 5 (ref 5–15)
BILIRUBIN TOTAL: 0.5 mg/dL (ref 0.3–1.2)
BUN: 7 mg/dL (ref 6–20)
CHLORIDE: 112 mmol/L — AB (ref 101–111)
CO2: 22 mmol/L (ref 22–32)
Calcium: 8.6 mg/dL — ABNORMAL LOW (ref 8.9–10.3)
Creatinine, Ser: 1.02 mg/dL (ref 0.61–1.24)
GFR calc Af Amer: >60 mL/min (ref >60)
GFR calc non Af Amer: >60 mL/min (ref >60)
GLUCOSE: 79 mg/dL (ref 65–99)
POTASSIUM: 3.4 mmol/L — AB (ref 3.5–5.1)
Sodium: 139 mmol/L (ref 135–145)
TOTAL PROTEIN: 6.3 g/dL — AB (ref 6.5–8.1)

## 2017-02-01 LAB — GLUCOSE, CAPILLARY
GLUCOSE-CAPILLARY: 98 mg/dL (ref 65–99)
GLUCOSE-CAPILLARY: 122 mg/dL — AB (ref 65–99)
Glucose-Capillary: 131 mg/dL — ABNORMAL HIGH (ref 65–99)

## 2017-02-01 LAB — CK TOTAL AND CKMB (NOT AT ARMC)
CK, MB: 3.6 ng/mL (ref 0.5–5.0)
CK, MB: 3.3 ng/mL (ref 0.5–5.0)
RELATIVE INDEX: 0.1 (ref 0.0–2.5)
Relative Index: 0.1 (ref 0.0–2.5)
Total CK: 4171 U/L — ABNORMAL HIGH (ref 49–397)
Total CK: 3442 U/L — ABNORMAL HIGH (ref 49–397)

## 2017-02-01 LAB — PHOSPHORUS: Phosphorus: 2.5 mg/dL (ref 2.5–4.6)

## 2017-02-01 LAB — MAGNESIUM: Magnesium: 1.8 mg/dL (ref 1.7–2.4)

## 2017-02-01 MED ORDER — LIDOCAINE 5 % EX PTCH: 1.0000 | MEDICATED_PATCH | CUTANEOUS | 0 refills | Status: DC

## 2017-02-01 MED ORDER — SENNOSIDES-DOCUSATE SODIUM 8.6-50 MG PO TABS: 1.0000 | ORAL_TABLET | Freq: Two times a day (BID) | ORAL | 0 refills | Status: DC

## 2017-02-01 MED ORDER — THIAMINE HCL 100 MG PO TABS
100.0000 mg | ORAL_TABLET | Freq: Every day | ORAL | 0 refills | Status: AC
Start: 2017-02-02 — End: ?

## 2017-02-01 MED ORDER — POTASSIUM CHLORIDE 10 MEQ/100ML IV SOLN
10.0000 meq | INTRAVENOUS | Status: AC
  Administered 2017-02-01 (×3): 10 meq via INTRAVENOUS
  Filled 2017-02-01 (×3): qty 100

## 2017-02-01 MED ORDER — AMOXICILLIN 500 MG PO CAPS: 500.0000 mg | ORAL_CAPSULE | Freq: Three times a day (TID) | ORAL | 0 refills | Status: DC

## 2017-02-01 MED ORDER — FOLIC ACID 1 MG PO TABS: 1.0000 mg | ORAL_TABLET | Freq: Every day | ORAL | 0 refills | Status: DC

## 2017-02-01 MED ORDER — SODIUM CHLORIDE 0.9 % IV BOLUS (SEPSIS)
500.0000 mL | Freq: Once | INTRAVENOUS | Status: AC
  Administered 2017-02-01: 500 mL via INTRAVENOUS

## 2017-02-01 MED ORDER — BACITRACIN ZINC 500 UNIT/GM EX OINT: TOPICAL_OINTMENT | Freq: Two times a day (BID) | CUTANEOUS | 0 refills | Status: DC

## 2017-02-01 NOTE — Care Management Note (Signed)
Case Management Note  Patient Details  Name: Terry Baxter MRN: [id number] Date of Birth: Apr 04, 1946  Subjective/Objective: 71 y.o. M admitted with weakness found UTI+, Rhabdo. Recently discharged from [facility]. MD called CM to have assist with HHPT follow up for discharge today. CM spoke with pt to ascertain choice and pt tells me he is moving to [place] in [place], [place]. Instructed pt to call PCP upon relocation and have HHPT arranged locally. Pt verbalized understanding and willingness.                      Action/Plan: MD aware of need for HHPT order and F2F. CM will sign off for now but will be available should additional discharge needs arise or disposition change.    Expected Discharge Date:                  Expected Discharge Plan:  Home/Self Care  In-House Referral:  NA  Discharge planning Services  CM Consult  Post Acute Care Choice:  Home Health Choice offered to:  Patient  DME Arranged:    DME Agency:     HH Arranged:    [place] Agency:     Status of Service:  Completed, signed off  If discussed at [employer] of Stay Meetings, dates discussed:    Additional Comments:  Delrae Sawyers, RN 02/01/2017, 1:40 PM

## 2017-02-01 NOTE — Discharge Summary (Signed)
Physician Discharge Summary  Terry Baxter QQI:[id number] DOB: 04/19/1946 DOA: 01/29/2017  PCP: Wallene Huh, MD  Admit date: 01/29/2017 Discharge date: 02/01/2017  Admitted From: Home Disposition:  Home with [facility] [facility]  Recommendations for Outpatient Follow-up:  1. Follow up with PCP in 1-2 weeks 2. Folllow up with Neurology [name] on 02/02/17 at 9:00 AM 3. Follow up with your Bariatric Surgeon as an outpatient  4. Defer starting HCTZ, Doxepin, and Atorvastatin to PCP 5. Please obtain CMP/CBC, Mag, Phos in one week  Home Health: YES  Equipment/Devices: None recommended by PT  Discharge Condition: Stable CODE STATUS: FULL CODE Diet recommendation: Heart Healthy  Brief/Interim Summary: Terry Murphyis a 71 y.o.malewith past medical history significant for anxiety, bipolar 1, low back pain, hepatitis C, history of heroin abuse, seizure disorder presents to the hospital with weakness. Patient states he was in his normal state of health until early on the day of admission when he fell down and couldn't get back up. He states that he he became very shaky in his legs and then suddenly weak. He then fell to the floor. Denies hitting his head or loss of consciousness. Ptstates he feels he was on the floor for approximately an hour. Patient's landlord happened to come by to find him on the floor. EMS reports patient was covered in feces and urine and there were bugs all over. Patient found to have multiple abrasions and these were covered with bacitracin, chest x-ray negative, hip x-ray negative. Hospitalist consulted for admission. Was found to be in Rhabdomyolysis and an Acute UTI. Patient's CK improved with IVF hydration. He complained of some back pain that was improved with a lidocaine patch. Patient was improved and will be D/C'd Home with Home PT and will need to follow up with PCP and Neurology as an outpatient as he has been deemed medically stable for D/C.   Discharge Diagnoses:   Principal Problem:   Acute on chronic renal failure (HCC) Active Problems:   Hepatitis C   Cannabis abuse   Seizure disorder (HCC)   Bipolar disorder ([place])   Stroke with cerebral ischemia (HCC)   Type 2 diabetes mellitus with other circulatory complications (HCC)   Essential hypertension   Hyperkalemia   Weakness   AKI (acute kidney injury) ([place])   Acute lower UTI   Insomnia   Elevated AST (SGOT)  AKI on CKD Stage 2, improved -Baseline Cr 1.1, Cr on admit 1.41 -BUN/Cr went from 18/1.27 -> 15/1.11 -> 15/1.09 -> 9/0.98 -> 7/1.02 -1 Lof normal saline given in the emergency room -C/w NS at 75 mL/hrs (Rate reduced) -Urine labs ordered to calculate fractional excretion of urea -Continue to Hold Hydrochlorothiazide at D/C -I&Os; Patient is +2.188 L -Will defer kidney imaging for now since this is likely due to patient being on the floor for an unknown amount of time  Fall in the Setting of UTI -Head CT done and showed No acute intracranial abnormality. There was Atrophy with chronic small vessel white matter ischemic disease -Physical Therapy consulted and Recommending Home Health PT -Checking CK and it went from 3113 -> 11,794 -> 6,916  -> 4,171 -> 3,442 -Check Urine Drug screen due to history of drug abuse; ws Positive for Benzos -Held Roxicet when hospitalized; Ok to Resume at D/C but will need close monitoring by PCP  E Coli UTI -Patient's Urine Showed Few Bacteria, Amber Color, Moderate Leukocytes, and TNTC WBC -Urine Cx showed >100,000 CFU of E Coli -Based on Prior Urine  Cx all Abx were sensitive and this Urine Cx was again pansensitive -IV Ceftriaxone and transitioned to po Amoxicillin 500 mg q8h in AM x 5 more days   Hypokalemia -Chronic Issue -Patient's K+ was 3.4 this AM -Replete with IV 30 mEQ and  po 40 mEQ po BID prior to D/C -Mag was 1.8 -Continue to Monitor and Replete as Necessary -Repeat CMP  As an outpatient   Rhabdomyolysis -Urinalysis showed Large  Hgb and 0-5 RBC / HPF -CK worsened and went from 3113 -> 11,794 -> 6,916  -> 4,171 -> 3,442 -CKMB was 13.6 and trended down to 7.6 -> 3.6 -> 3.3 -Encouraged po Hydration extensively   Weaknessand Left Upper Extremity weakness -Likely due to multiple electrolyte abnormalities, could be due to Hydrochlorothiazide -Physical therapy evaluated and recommending Home PT -Checked Head CT-patient's weakness seems more global; Scan as above,  -Will consider MRI if no improvement with treating of multitude of other issues; Defer to Neurology [name] who will see the patient in the AM -Restart Roxicet, Gabapentin as an outpatient  -Continue to Monitor   Seizures -Continue Levetiracetam 500 mg po BID -Seizure Precautions -Was on IV Lorazepam 2 mg IV q4hprn for Seizures while hospitalized -Follow up with [name] in Neurology on 02/02/17 at 9:00 AM for F/U  Depression -No SI / HI -Continue Bupropion 300 mg po Daily and Risperidone 1 mg po TID  Anxiety -Continue Clonazepam 0.5 mg po TIDprn Anxiety   Insomnia -HoldDoxepin for now and defer to PCP to restart as an outpatient  Chronic Pain -Holding Gabapentin for now due to AKI on CKD -When necessary Tylenol 650 mg po q6hprn -Added Lidocaine Patch for Back Pain  Hypertension -Hold Hydrochlorothiazide at D/C and defer to PCP to restart or change as an outpatient -Was on Hydralazine 10 mg IV q8hprn for SBP > 180 or DBP > 100 while hospitalized   DM Mellitus Type 2 -Was on Resistant Novolog SSI  -CBG's ranging from 77-131 -Follow up as an outpatient  Hyperlipidemia -Hold Atorvastatin given elevation in CK -Have PCP restart given elevation in CK  Hypophosphatemia -Patient's Phos Level was 2.2 yesterday and improved to 2.5 -Continue to Monitor and Replete as Necessary -Repeat Phos in AM  Hx of Hepatitis C -S/p Treatment with [facility] -AST was 135 and ALT was 32 -Continue to Monitor and repeat CMP  Elevated AST -AST went  from 135 -> 104 -AST was approximately 4x ALT Value -? EtOH -Was started on CIWA Protocol with Lorazepam for possible Withdrawls (Given his Substance Abuse Hx) -Repeat CMP as an outpatient  -EtOH level was not checked on Admission -If remains elevated will consider RUQ U/S as an outpatient   Hx of CVA -Hold Atorvastatin for now due to elevated CK -Follow up with Neurology in AM  Discharge Instructions  Discharge Instructions    Call MD for:  difficulty breathing, headache or visual disturbances    Complete by:  As directed    Call MD for:  extreme fatigue    Complete by:  As directed    Call MD for:  hives    Complete by:  As directed    Call MD for:  persistant dizziness or light-headedness    Complete by:  As directed    Call MD for:  persistant nausea and vomiting    Complete by:  As directed    Call MD for:  redness, tenderness, or signs of infection (pain, swelling, redness, odor or green/yellow discharge around incision  site)    Complete by:  As directed    Call MD for:  severe uncontrolled pain    Complete by:  As directed    Call MD for:  temperature >100.4    Complete by:  As directed    Diet - low sodium heart healthy    Complete by:  As directed    Discharge instructions    Complete by:  As directed    Follow Up with PCP within a week. Take all medications as prescribed and avoid taking HCTZ and Atorvastatin until evaluated by PCP. If symptoms change or worsen please return to the ED for evaluation.   Increase activity slowly    Complete by:  As directed      Allergies as of 02/01/2017      Reactions   Tizanidine Other (See Comments)   dizziness   Vicodin [hydrocodone-acetaminophen] Other (See Comments)   Can't take due to bariatric surgery      Medication List    STOP taking these medications   atorvastatin 40 MG tablet Commonly known as:  LIPITOR   doxepin 25 MG capsule Commonly known as:  SINEQUAN   hydrochlorothiazide 12.5 MG capsule Commonly  known as:  MICROZIDE     TAKE these medications   amoxicillin 500 MG capsule Commonly known as:  AMOXIL Take 1 capsule (500 mg total) by mouth 3 (three) times daily.   bacitracin ointment Apply topically 2 (two) times daily.   buPROPion 300 MG 24 hr tablet Commonly known as:  WELLBUTRIN XL Take 300 mg by mouth daily with breakfast.   clonazePAM 0.5 MG tablet Commonly known as:  KLONOPIN Take 1 tablet (0.5 mg total) by mouth 3 (three) times daily as needed for anxiety.   folic acid 1 MG tablet Commonly known as:  FOLVITE Take 1 tablet (1 mg total) by mouth daily. Start taking on:  02/02/2017   gabapentin 300 MG capsule Commonly known as:  NEURONTIN Take 300 mg by mouth 2 (two) times daily.   levETIRAcetam 500 MG tablet Commonly known as:  KEPPRA Take 500 mg by mouth 2 (two) times daily.   lidocaine 5 % Commonly known as:  LIDODERM Place 1 patch onto the skin daily. Remove & Discard patch within 12 hours or as directed by MD   multivitamin with minerals Tabs tablet Take 1 tablet by mouth daily.   oxyCODONE-acetaminophen 5-325 MG/5ML solution Commonly known as:  ROXICET Take 5 mLs by mouth every 6 (six) hours as needed. Take 43mls (5 mg) for mild pain and 10 mls (10mg ) for severe pain.   polyethylene glycol packet Commonly known as:  MIRALAX / GLYCOLAX Take 17 g by mouth daily as needed for mild constipation.   risperiDONE 1 MG tablet Commonly known as:  RISPERDAL Take 1 mg by mouth 3 (three) times daily.   senna-docusate 8.6-50 MG tablet Commonly known as:  Senokot-S Take 1 tablet by mouth 2 (two) times daily.   SYSTANE BALANCE 0.6 % Soln Generic drug:  Propylene Glycol Place 1 drop into both eyes 3 (three) times daily.   thiamine 100 MG tablet Take 1 tablet (100 mg total) by mouth daily. Start taking on:  02/02/2017      Follow-up Information    Wallene Huh, MD. Call in 1 week(s).   Specialty:  Cardiology Why:  Call to schedule a follow up appointment  within 1 week  Contact information: [place] [address] [phone]  Allergies  Allergen Reactions  . Tizanidine Other (See Comments)    dizziness  . Vicodin [Hydrocodone-Acetaminophen] Other (See Comments)    Can't take due to bariatric surgery   Consultations:  None  Procedures/Studies: Ct Head Wo Contrast  Result Date: 01/29/2017 CLINICAL DATA:  Upper extremity weakness. EXAM: CT HEAD WITHOUT CONTRAST TECHNIQUE: Contiguous axial images were obtained from the base of the skull through the vertex without intravenous contrast. COMPARISON:  06/23/2016 FINDINGS: Brain: There is no evidence for acute hemorrhage, hydrocephalus, mass lesion, or abnormal extra-axial fluid collection. No definite CT evidence for acute infarction. Diffuse loss of parenchymal volume is consistent with atrophy. Patchy low attenuation in the deep hemispheric and periventricular white matter is nonspecific, but likely reflects chronic microvascular ischemic demyelination. 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 Vascular: No hyperdense vessel or unexpected calcification. Skull: No evidence for fracture. No worrisome lytic or sclerotic lesion. Sinuses/Orbits: The visualized paranasal sinuses and mastoid air cells are clear. Visualized portions of the globes and intraorbital fat are unremarkable. Other: None. IMPRESSION: 1. No acute intracranial abnormality. 2. Atrophy with chronic small vessel white matter ischemic disease. Electronically Signed   By: Misty Stanley M.D.   On: 01/29/2017 19:58   Dg Chest Port 1 View  Result Date: 01/29/2017 CLINICAL DATA:  Found on the floor.  Fall. EXAM: PORTABLE CHEST 1 VIEW COMPARISON:  06/23/2006 FINDINGS: Heart and mediastinal contours are within normal limits. No focal opacities or effusions. No acute bony abnormality. IMPRESSION: No active disease. Electronically Signed   By: Rolm Baptise M.D.   On: 01/29/2017 12:06   Dg Duanne Limerick  W/water Sol Cm  Result Date: 01/07/2017 CLINICAL DATA:  71 year old male status post gastric sleeve surgery yesterday. Elevated white blood cell count. EXAM: WATER SOLUBLE UPPER GI SERIES TECHNIQUE: Single-column upper GI series was performed using water soluble contrast. CONTRAST:  50 mL of Isovue-300 orally. COMPARISON:  Upper GI 02/21/2016. FLUOROSCOPY TIME:  Fluoroscopy Time:  2.4 minutes Radiation Exposure Index (if provided by the fluoroscopic device): 35.3 mGy FINDINGS: Preprocedural KUB demonstrates a nonobstructive bowel gas pattern. Suture line projecting over the stomach. Upon ingestion of water soluble contrast, and the esophagus was grossly normal in appearance. Contrast entered the stomach, and the stomach has a characteristic narrowed lumen typical of sleeve gastrectomy. No contrast extravasation was noted during the examination. Contrast readily traversed the pylorus and extended through the duodenum which was normal in appearance. IMPRESSION: 1. Typical findings of sleeve gastrectomy, without evidence of postoperative leak. Electronically Signed   By: Vinnie Langton M.D.   On: 01/07/2017 10:10   Dg Hip Unilat W Or Wo Pelvis 2-3 Views Left  Result Date: 01/29/2017 FALL CLINICAL DATA: . EXAM: DG HIP (WITH OR WITHOUT PELVIS) 2-3V LEFT COMPARISON:  06/23/2016 FINDINGS: Mild symmetric degenerative changes in the hips. SI joint symmetric and unremarkable. No acute bony abnormality. Specifically, no fracture, subluxation, or dislocation. Soft tissues are intact. IMPRESSION: No acute bony abnormality. Electronically Signed   By: Rolm Baptise M.D.   On: 01/29/2017 12:07    Subjective: Seen and examined at bedside and was improved. No longer complaining of back pain. Denied any CP or SOB and ready to go home.   Discharge Exam: Vitals:   02/01/17 0552 02/01/17 1015  BP: 129/79 111/79  Pulse: 91 98  Resp: 18 18  Temp: 98.4 F (36.9 C) 98.4 F (36.9 C)   Vitals:   01/31/17 1820 01/31/17  2246 02/01/17 0552 02/01/17 1015  BP: 135/64  123/85 129/79 111/79  Pulse: 87 81 91 98  Resp: 18 18 18 18   Temp: 98.7 F (37.1 C) 98.4 F (36.9 C) 98.4 F (36.9 C) 98.4 F (36.9 C)  TempSrc: Oral Oral Oral Oral  SpO2: 98% 98% 99% 99%  Weight:  116.1 kg (256 lb)    Height:  5\' 8"  (1.727 m)     General: Pt is an morbildy obese AAM who is alert, awake, not in acute distress Cardiovascular: RRR, S1/S2 +, no rubs, no gallops Respiratory: CTA bilaterally, no wheezing, no rhonchi Abdominal: Soft, NT, ND, bowel sounds + Extremities: Mild edema, no cyanosis  The results of significant diagnostics from this hospitalization (including imaging, microbiology, ancillary and laboratory) are listed below for reference.    Microbiology: Recent Results (from the past 240 hour(s))  Urine culture     Status: Abnormal   Collection Time: 01/29/17  2:28 PM  Result Value Ref Range Status   Specimen Description URINE, CLEAN CATCH  Final   Special Requests unknown Normal  Final   Culture >=100,000 COLONIES/mL ESCHERICHIA COLI (A)  Final   Report Status 01/31/2017 FINAL  Final   Organism ID, Bacteria ESCHERICHIA COLI (A)  Final      Susceptibility   Escherichia coli - MIC*    AMPICILLIN 4 SENSITIVE Sensitive     CEFAZOLIN <=4 SENSITIVE Sensitive     CEFTRIAXONE <=1 SENSITIVE Sensitive     CIPROFLOXACIN <=0.25 SENSITIVE Sensitive     GENTAMICIN <=1 SENSITIVE Sensitive     IMIPENEM <=0.25 SENSITIVE Sensitive     NITROFURANTOIN <=16 SENSITIVE Sensitive     TRIMETH/SULFA <=20 SENSITIVE Sensitive     AMPICILLIN/SULBACTAM <=2 SENSITIVE Sensitive     PIP/TAZO <=4 SENSITIVE Sensitive     Extended ESBL NEGATIVE Sensitive     * >=100,000 COLONIES/mL ESCHERICHIA COLI    Labs: BNP (last 3 results) No results for input(s): BNP in the last 8760 hours. Basic Metabolic Panel:  Recent Labs Lab 01/29/17 1159 01/29/17 1925 01/30/17 0451 01/30/17 0653 01/30/17 0830 01/31/17 0556 02/01/17 0338  NA 140  137 141  --  141 142 139  K 2.4* 3.0* 2.7* 2.8* 2.9* 3.3* 3.4*  CL 105 108 111  --  110 112* 112*  CO2 25 22 23   --  22 23 22   GLUCOSE 129* 103* 97  --  102* 90 79  BUN 17 18 15   --  15 9 7   CREATININE 1.41* 1.27* 1.11  --  1.09 0.98 1.02  CALCIUM 9.5 8.4* 8.6*  --  8.6* 8.1* 8.6*  MG 1.8  --  1.8  --   --  1.8 1.8  PHOS 1.2*  --  2.2*  --   --  3.0 2.5   Liver Function Tests:  Recent Labs Lab 01/29/17 1159 01/31/17 0556 02/01/17 0338  AST 58* 135* 104*  ALT 22 32 33  ALKPHOS 65 53 61  BILITOT 0.9 0.6 0.5  PROT 7.4 5.9* 6.3*  ALBUMIN 3.9 2.7* 2.9*   No results for input(s): LIPASE, AMYLASE in the last 168 hours. No results for input(s): AMMONIA in the last 168 hours. CBC:  Recent Labs Lab 01/29/17 1159 01/30/17 0451 01/31/17 0556 02/01/17 0338  WBC 11.0* 7.3 8.2 7.4  NEUTROABS 8.9*  --  5.4 4.5  HGB 13.7 11.7* 11.4* 12.4*  HCT 40.1 35.3* 34.1* 38.1*  MCV 90.5 91.0 91.2 92.7  PLT 199 165 167 178   Cardiac Enzymes:  Recent Labs Lab 01/29/17 1430 01/30/17  0830 01/31/17 0846 02/01/17 0338 02/01/17 1150  CKTOTAL 3,113* 11,794* 6,916* 4,171* 3,442*  CKMB  --  13.6* 7.6* 3.6 3.3   BNP: Invalid input(s): POCBNP CBG:  Recent Labs Lab 01/31/17 1739 01/31/17 2250 02/01/17 0206 02/01/17 0849 02/01/17 1211  GLUCAP 86 77 98 122* 131*   D-Dimer No results for input(s): DDIMER in the last 72 hours. Hgb A1c No results for input(s): HGBA1C in the last 72 hours. Lipid Profile No results for input(s): CHOL, HDL, LDLCALC, TRIG, CHOLHDL, LDLDIRECT in the last 72 hours. Thyroid function studies No results for input(s): TSH, T4TOTAL, T3FREE, THYROIDAB in the last 72 hours.  Invalid input(s): FREET3 Anemia work up No results for input(s): VITAMINB12, FOLATE, FERRITIN, TIBC, IRON, RETICCTPCT in the last 72 hours. Urinalysis    Component Value Date/Time   COLORURINE AMBER (A) 01/29/2017 1428   APPEARANCEUR CLOUDY (A) 01/29/2017 1428   LABSPEC 1.023 01/29/2017  1428   PHURINE 5.0 01/29/2017 1428   GLUCOSEU NEGATIVE 01/29/2017 1428   HGBUR LARGE (A) 01/29/2017 1428   BILIRUBINUR NEGATIVE 01/29/2017 1428   KETONESUR 5 (A) 01/29/2017 1428   PROTEINUR 100 (A) 01/29/2017 1428   UROBILINOGEN 1.0 11/07/2014 0511   NITRITE NEGATIVE 01/29/2017 1428   LEUKOCYTESUR MODERATE (A) 01/29/2017 1428   Sepsis Labs Invalid input(s): PROCALCITONIN,  WBC,  LACTICIDVEN Microbiology Recent Results (from the past 240 hour(s))  Urine culture     Status: Abnormal   Collection Time: 01/29/17  2:28 PM  Result Value Ref Range Status   Specimen Description URINE, CLEAN CATCH  Final   Special Requests unknown Normal  Final   Culture >=100,000 COLONIES/mL ESCHERICHIA COLI (A)  Final   Report Status 01/31/2017 FINAL  Final   Organism ID, Bacteria ESCHERICHIA COLI (A)  Final      Susceptibility   Escherichia coli - MIC*    AMPICILLIN 4 SENSITIVE Sensitive     CEFAZOLIN <=4 SENSITIVE Sensitive     CEFTRIAXONE <=1 SENSITIVE Sensitive     CIPROFLOXACIN <=0.25 SENSITIVE Sensitive     GENTAMICIN <=1 SENSITIVE Sensitive     IMIPENEM <=0.25 SENSITIVE Sensitive     NITROFURANTOIN <=16 SENSITIVE Sensitive     TRIMETH/SULFA <=20 SENSITIVE Sensitive     AMPICILLIN/SULBACTAM <=2 SENSITIVE Sensitive     PIP/TAZO <=4 SENSITIVE Sensitive     Extended ESBL NEGATIVE Sensitive     * >=100,000 COLONIES/mL ESCHERICHIA COLI   Time coordinating discharge: 35 minutes  SIGNED:  Kerney Elbe, DO [facility] 02/01/2017, 7:45 PM Pager [phone]  If 7PM-7AM, please contact night-coverage [email or web address] Password [email or web address]

## 2017-02-01 NOTE — Progress Notes (Signed)
Terry Baxter to be D/C'd Home per MD order.  Discussed prescriptions and follow up appointments with the patient. Prescriptions given to patient, medication list explained in detail. Pt verbalized understanding.  Allergies as of 02/01/2017      Reactions   Tizanidine Other (See Comments)   dizziness   Vicodin [hydrocodone-acetaminophen] Other (See Comments)   Can't take due to bariatric surgery      Medication List    STOP taking these medications   atorvastatin 40 MG tablet Commonly known as:  LIPITOR   doxepin 25 MG capsule Commonly known as:  SINEQUAN   hydrochlorothiazide 12.5 MG capsule Commonly known as:  MICROZIDE     TAKE these medications   amoxicillin 500 MG capsule Commonly known as:  AMOXIL Take 1 capsule (500 mg total) by mouth 3 (three) times daily.   bacitracin ointment Apply topically 2 (two) times daily.   buPROPion 300 MG 24 hr tablet Commonly known as:  WELLBUTRIN XL Take 300 mg by mouth daily with breakfast.   clonazePAM 0.5 MG tablet Commonly known as:  KLONOPIN Take 1 tablet (0.5 mg total) by mouth 3 (three) times daily as needed for anxiety.   folic acid 1 MG tablet Commonly known as:  FOLVITE Take 1 tablet (1 mg total) by mouth daily. Start taking on:  02/02/2017   gabapentin 300 MG capsule Commonly known as:  NEURONTIN Take 300 mg by mouth 2 (two) times daily.   levETIRAcetam 500 MG tablet Commonly known as:  KEPPRA Take 500 mg by mouth 2 (two) times daily.   lidocaine 5 % Commonly known as:  LIDODERM Place 1 patch onto the skin daily. Remove & Discard patch within 12 hours or as directed by MD   multivitamin with minerals Tabs tablet Take 1 tablet by mouth daily.   oxyCODONE-acetaminophen 5-325 MG/5ML solution Commonly known as:  ROXICET Take 5 mLs by mouth every 6 (six) hours as needed. Take 98mls (5 mg) for mild pain and 10 mls (10mg ) for severe pain.   polyethylene glycol packet Commonly known as:  MIRALAX / GLYCOLAX Take 17 g  by mouth daily as needed for mild constipation.   risperiDONE 1 MG tablet Commonly known as:  RISPERDAL Take 1 mg by mouth 3 (three) times daily.   senna-docusate 8.6-50 MG tablet Commonly known as:  Senokot-S Take 1 tablet by mouth 2 (two) times daily.   SYSTANE BALANCE 0.6 % Soln Generic drug:  Propylene Glycol Place 1 drop into both eyes 3 (three) times daily.   thiamine 100 MG tablet Take 1 tablet (100 mg total) by mouth daily. Start taking on:  02/02/2017       Vitals:   02/01/17 0552 02/01/17 1015  BP: 129/79 111/79  Pulse: 91 98  Resp: 18 18  Temp: 98.4 F (36.9 C) 98.4 F (36.9 C)    Skin clean, dry and intact without evidence of skin break down, no evidence of skin tears noted. IV catheter discontinued intact. Site without signs and symptoms of complications. Dressing and pressure applied. Pt denies pain at this time. No complaints noted.  An After Visit Summary was printed and given to the patient. Patient escorted via [place], and D/C home via private auto.  Haywood Lasso BSN, RN [facility] Phone [phone]

## 2017-02-02 ENCOUNTER — Ambulatory Visit: Payer: Medicare Other | Admitting: Neurology

## 2017-02-02 ENCOUNTER — Telehealth: Payer: Self-pay

## 2017-02-02 NOTE — Telephone Encounter (Signed)
Patient was no show for appt today.

## 2017-02-03 ENCOUNTER — Encounter: Payer: Self-pay | Admitting: Neurology

## 2017-02-17 ENCOUNTER — Ambulatory Visit: Payer: Medicare Other | Admitting: Nurse Practitioner

## 2017-02-18 ENCOUNTER — Telehealth (HOSPITAL_COMMUNITY): Payer: Self-pay

## 2017-02-18 NOTE — Telephone Encounter (Signed)
Called for follow up with Terry Baxter on current number located in chart.  Patient did answer and voice mailbox is full.

## 2017-02-23 ENCOUNTER — Ambulatory Visit: Payer: Self-pay | Admitting: Skilled Nursing Facility1

## 2017-06-11 IMAGING — MR MR LUMBAR SPINE W/O CM
4 of 5 series · 27 of 48 positions shown · non-contrast
Comparison: Lumbar MRI 06/30/2014

CLINICAL DATA: 71-year-old male with chronic lumbar back pain
without radiculopathy.

EXAM:
MRI LUMBAR SPINE WITHOUT CONTRAST
TECHNIQUE: Multiplanar, multisequence MR imaging of the lumbar spine was
performed. No intravenous contrast was administered.

[Series 3: T2 post-contrast · sagittal · 4.0mm · 0.55mm/px · 6 of 14 slices shown]
[im 1/14]
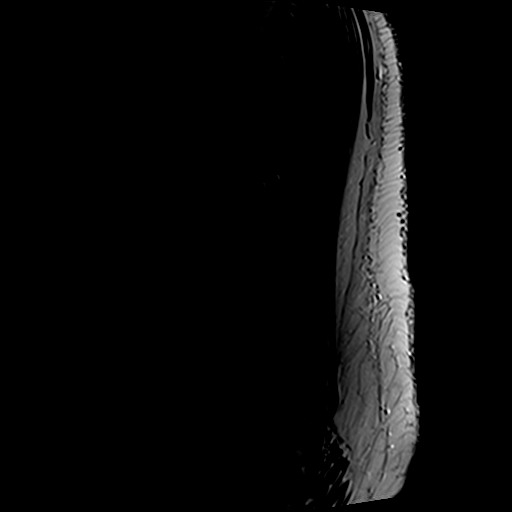
[im 3/14]
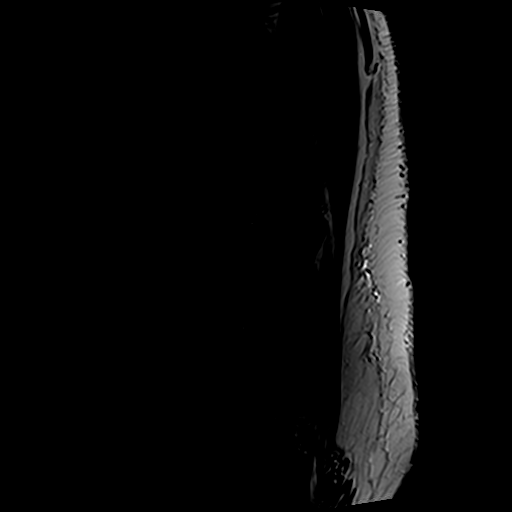
[im 6/14]
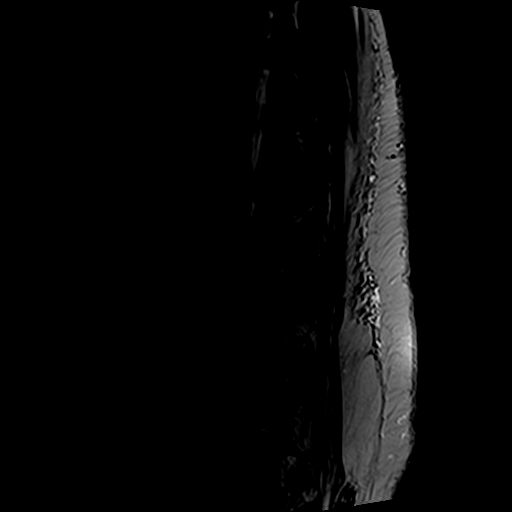
[im 8/14]
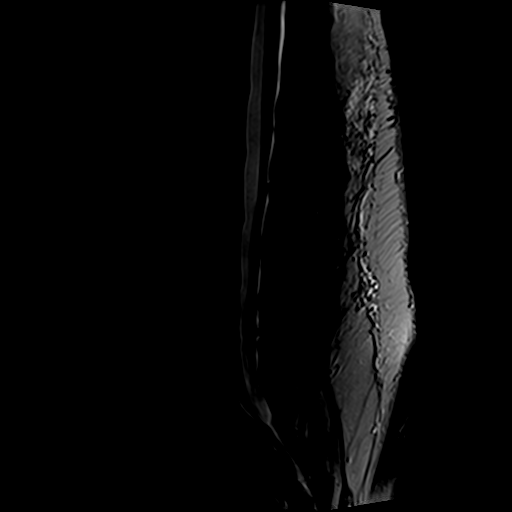
[im 11/14]
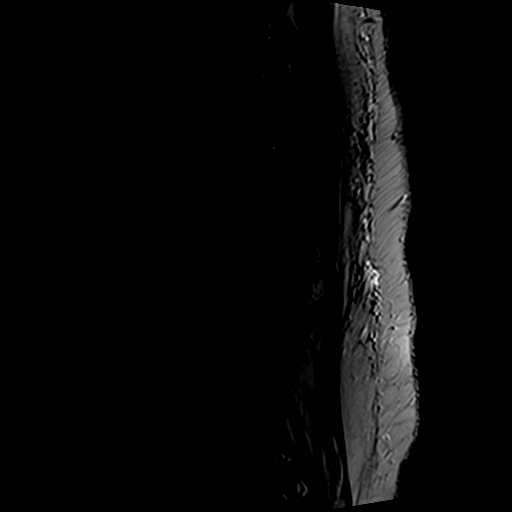
[im 14/14]
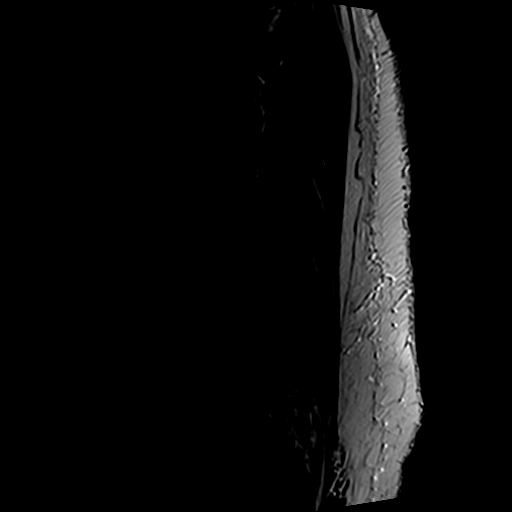

[Series 5: T1 · sagittal · 4.0mm · 0.73mm/px · 6 of 14 slices shown (1 of 2)]
[im 1/14]
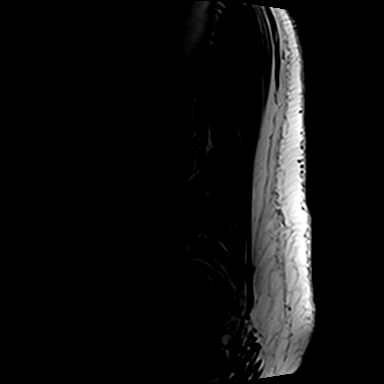
[im 3/14]
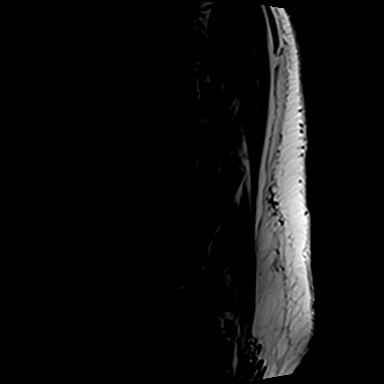
[im 6/14]
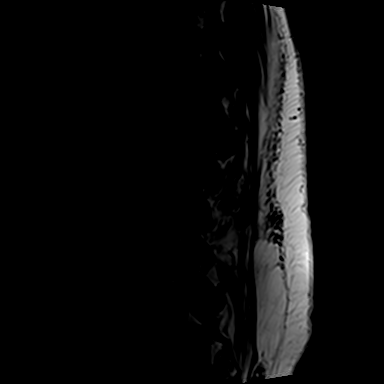
[im 8/14]
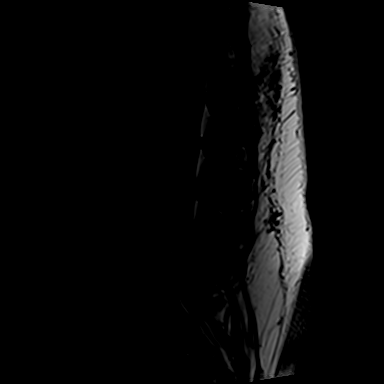
[im 11/14]
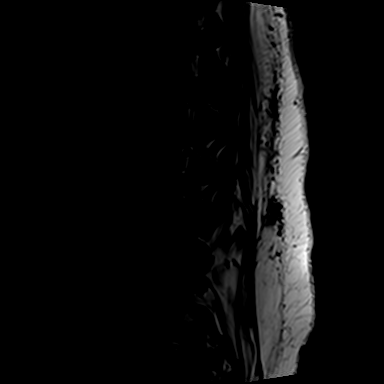
[im 14/14]
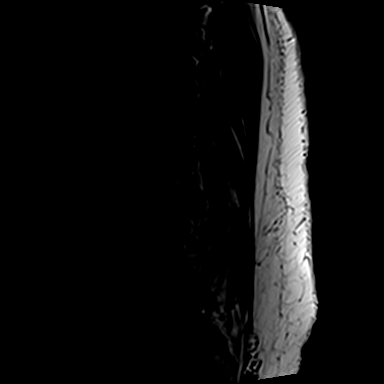

[Series 6: T1 · axial · 4.0mm · 0.35mm/px · z∈[-106,+44]mm · 6 of 34 slices shown (2 of 2)]
[im 1/34]
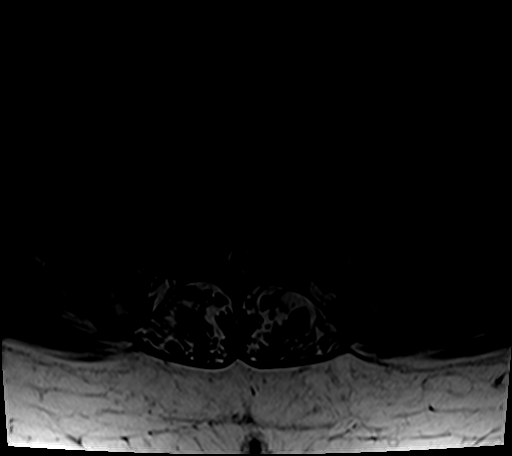
[im 5/34]
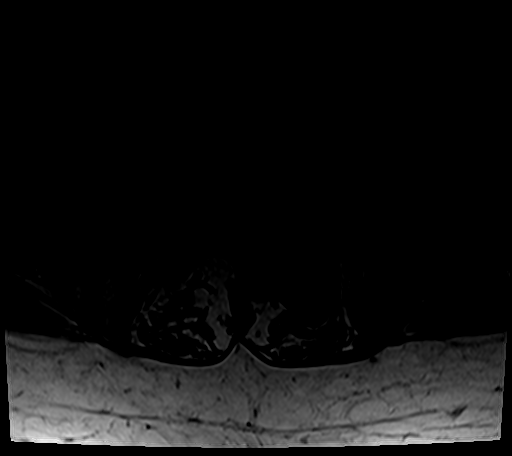
[im 10/34]
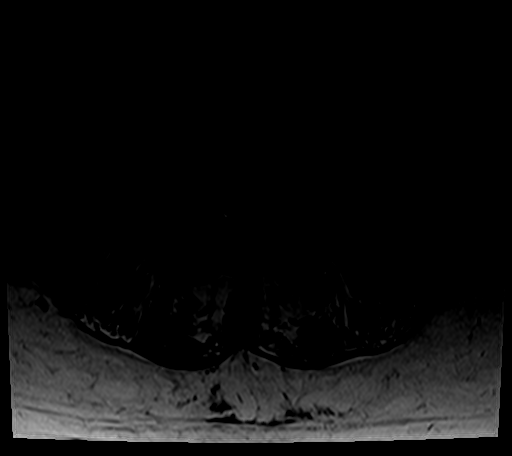
[im 15/34]
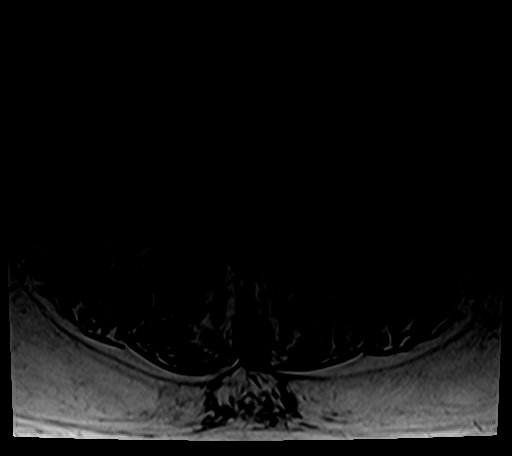
[im 17/34]
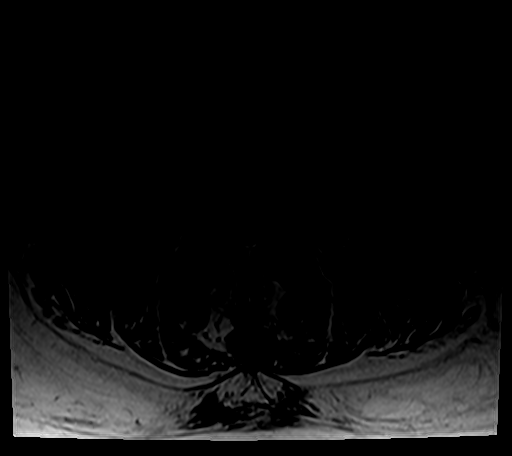
[im 29/34]
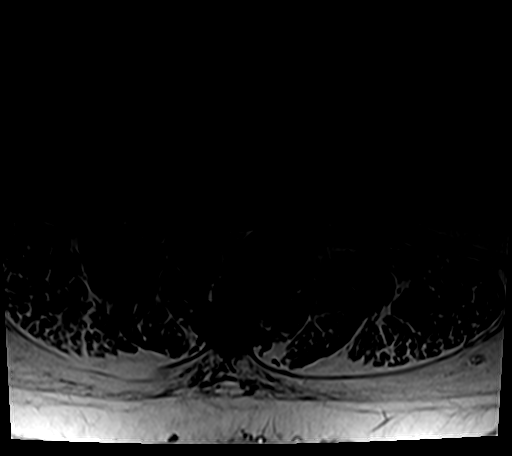

[Series 7: T2 · axial · 4.0mm · 0.70mm/px · z∈[-106,+70]mm · 9 of 34 slices shown]
[im 1/34]
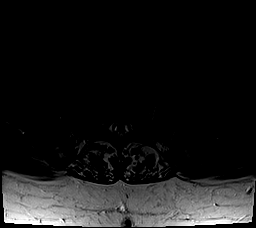
[im 5/34]
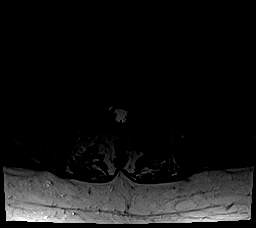
[im 10/34]
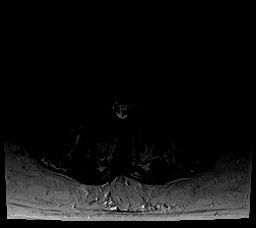
[im 15/34]
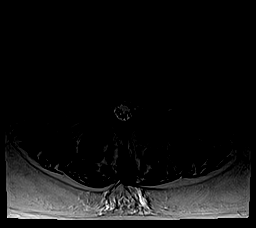
[im 17/34]
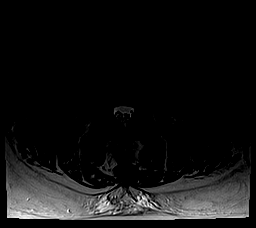
[im 19/34]
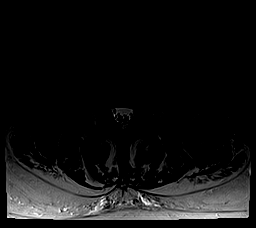
[im 24/34]
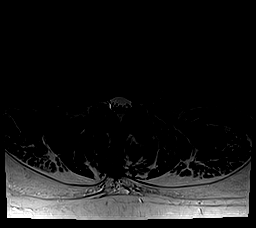
[im 29/34]
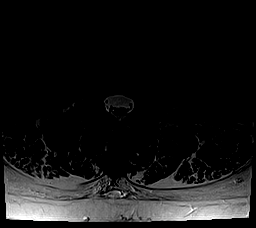
[im 34/34]
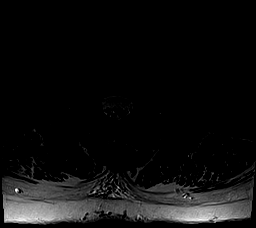

[27 of 48 positions shown; findings below may reference images not displayed]

FINDINGS: Segmentation: Same numbering system as on the 2054 MRI designating
normal lumbar segmentation, severe chronic disc space loss at L4-L5.

Alignment:  Stable with straightening of lumbar lordosis.

Vertebrae: No marrow edema or evidence of acute osseous abnormality.
Visualized bone marrow signal is within normal limits. Evidence of
interbody ankylosis at L4-L5. Bulky anterior bridging osteophytes at
L2-L3 and L3-L4, possibly with functional interbody fusion also at
L2-L3. Visible sacrum and SI joints intact.

Conus medullaris: Extends to the T12-L1 level and appears normal.

Paraspinal and other soft tissues: Stable and negative.

Disc levels:

T11-T12:  Negative.

T12-L1:  Mild facet hypertrophy.

L1-L2: Mild to moderate facet hypertrophy greater on the left. No
stenosis.

L2-L3: Stable mild circumferential disc bulge with broad-based
posterior component. Stable mild facet and ligament flavum
hypertrophy. Stable mild left lateral recess stenosis (descending
left L3 nerve root level series 7, image 11). No spinal or foraminal
stenosis.

L3-L4: Chronic circumferential but mostly far-lateral and anterior
disc bulging and endplate spurring. Broad-based posterior component
is stable. Mild to moderate facet and ligament flavum hypertrophy is
stable. No spinal stenosis. Stable borderline to mild left lateral
recess stenosis (descending left L4 nerve root level). Stable mild
bilateral L3 foraminal stenosis in large part due to endplate
spurring.

L4-L5: Chronic severe disc space loss and suspected interbody
ankylosis. Bulky far lateral and anterior eccentric endplate
spurring. Broad-based posterior component is stable. Moderate severe
facet and ligament flavum hypertrophy is stable. Stable borderline
to mild bilateral lateral recess stenosis and L4 foraminal stenosis
(descending L5 and exiting L4 nerve root levels respectively).

L5-S1: Chronic posterior disc space loss with bulky circumferential
and especially far lateral disc bulging and endplate spurring.
Moderate to severe facet and ligament flavum hypertrophy greater on
the left. Moderate to severe left lateral recess stenosis
(descending left S1 nerve root level, series 7, image 28). Mild
bilateral L5 foraminal stenosis mostly related to endplate spurring.
This level is stable.
IMPRESSION: 1. Stable MRI appearance of the lumbar spine since [DATE]. Chronic severe disc space loss with interbody ankylosis at L4-L5.
And possibly also functional interbody ankylosis at L2-L3 due to
bulky anterior bridging osteophytes.
3. Bulky circumferential but mostly far lateral and anterior disc
bulging and endplate spurring throughout the lumbar spine. No
significant lumbar spinal stenosis. Mild lumbar foraminal stenosis.
Mild chronic lateral recess stenosis from L2-L3 to L4-L5, and
moderate to severe chronic left lateral recess stenosis at L5-S1.

## 2021-04-03 DIAGNOSIS — F419 Anxiety disorder, unspecified: Secondary | ICD-10-CM | POA: Diagnosis not present

## 2021-04-10 DIAGNOSIS — B351 Tinea unguium: Secondary | ICD-10-CM | POA: Diagnosis not present

## 2021-04-10 DIAGNOSIS — M2062 Acquired deformities of toe(s), unspecified, left foot: Secondary | ICD-10-CM | POA: Diagnosis not present

## 2021-04-10 DIAGNOSIS — E119 Type 2 diabetes mellitus without complications: Secondary | ICD-10-CM | POA: Diagnosis not present

## 2021-04-24 DIAGNOSIS — G40909 Epilepsy, unspecified, not intractable, without status epilepticus: Secondary | ICD-10-CM | POA: Diagnosis not present

## 2021-04-24 DIAGNOSIS — E119 Type 2 diabetes mellitus without complications: Secondary | ICD-10-CM | POA: Diagnosis not present

## 2021-04-24 DIAGNOSIS — I10 Essential (primary) hypertension: Secondary | ICD-10-CM | POA: Diagnosis not present

## 2021-04-24 DIAGNOSIS — R739 Hyperglycemia, unspecified: Secondary | ICD-10-CM | POA: Diagnosis not present

## 2021-04-24 DIAGNOSIS — Z9884 Bariatric surgery status: Secondary | ICD-10-CM | POA: Diagnosis not present

## 2021-04-24 DIAGNOSIS — Z79899 Other long term (current) drug therapy: Secondary | ICD-10-CM | POA: Diagnosis not present

## 2021-04-24 DIAGNOSIS — Z23 Encounter for immunization: Secondary | ICD-10-CM | POA: Diagnosis not present

## 2021-07-03 DIAGNOSIS — F419 Anxiety disorder, unspecified: Secondary | ICD-10-CM | POA: Diagnosis not present

## 2021-08-28 DIAGNOSIS — R739 Hyperglycemia, unspecified: Secondary | ICD-10-CM | POA: Diagnosis not present

## 2021-08-28 DIAGNOSIS — Z9884 Bariatric surgery status: Secondary | ICD-10-CM | POA: Diagnosis not present

## 2021-08-28 DIAGNOSIS — G40909 Epilepsy, unspecified, not intractable, without status epilepticus: Secondary | ICD-10-CM | POA: Diagnosis not present

## 2021-08-28 DIAGNOSIS — Z79899 Other long term (current) drug therapy: Secondary | ICD-10-CM | POA: Diagnosis not present

## 2021-08-28 DIAGNOSIS — E119 Type 2 diabetes mellitus without complications: Secondary | ICD-10-CM | POA: Diagnosis not present

## 2021-08-28 DIAGNOSIS — I10 Essential (primary) hypertension: Secondary | ICD-10-CM | POA: Diagnosis not present

## 2021-08-28 DIAGNOSIS — E785 Hyperlipidemia, unspecified: Secondary | ICD-10-CM | POA: Diagnosis not present

## 2021-10-02 DIAGNOSIS — F419 Anxiety disorder, unspecified: Secondary | ICD-10-CM | POA: Diagnosis not present

## 2021-10-17 DIAGNOSIS — F419 Anxiety disorder, unspecified: Secondary | ICD-10-CM | POA: Diagnosis not present

## 2022-01-01 DIAGNOSIS — F419 Anxiety disorder, unspecified: Secondary | ICD-10-CM | POA: Diagnosis not present

## 2022-02-26 DIAGNOSIS — Z79899 Other long term (current) drug therapy: Secondary | ICD-10-CM | POA: Diagnosis not present

## 2022-02-26 DIAGNOSIS — G40909 Epilepsy, unspecified, not intractable, without status epilepticus: Secondary | ICD-10-CM | POA: Diagnosis not present

## 2022-02-26 DIAGNOSIS — E119 Type 2 diabetes mellitus without complications: Secondary | ICD-10-CM | POA: Diagnosis not present

## 2022-02-26 DIAGNOSIS — E669 Obesity, unspecified: Secondary | ICD-10-CM | POA: Diagnosis not present

## 2022-02-26 DIAGNOSIS — I10 Essential (primary) hypertension: Secondary | ICD-10-CM | POA: Diagnosis not present

## 2022-03-17 DIAGNOSIS — F419 Anxiety disorder, unspecified: Secondary | ICD-10-CM | POA: Diagnosis not present

## 2022-04-14 DIAGNOSIS — F419 Anxiety disorder, unspecified: Secondary | ICD-10-CM | POA: Diagnosis not present

## 2022-05-28 DIAGNOSIS — F419 Anxiety disorder, unspecified: Secondary | ICD-10-CM | POA: Diagnosis not present

## 2022-07-09 DIAGNOSIS — F419 Anxiety disorder, unspecified: Secondary | ICD-10-CM | POA: Diagnosis not present

## 2022-08-26 DIAGNOSIS — F419 Anxiety disorder, unspecified: Secondary | ICD-10-CM | POA: Diagnosis not present

## 2022-09-08 DIAGNOSIS — W19XXXA Unspecified fall, initial encounter: Secondary | ICD-10-CM | POA: Diagnosis not present

## 2022-09-08 DIAGNOSIS — E119 Type 2 diabetes mellitus without complications: Secondary | ICD-10-CM | POA: Diagnosis not present

## 2022-09-08 DIAGNOSIS — N189 Chronic kidney disease, unspecified: Secondary | ICD-10-CM | POA: Diagnosis not present

## 2022-09-08 DIAGNOSIS — I10 Essential (primary) hypertension: Secondary | ICD-10-CM | POA: Diagnosis not present

## 2022-09-08 DIAGNOSIS — E785 Hyperlipidemia, unspecified: Secondary | ICD-10-CM | POA: Diagnosis not present

## 2022-09-08 DIAGNOSIS — Z79899 Other long term (current) drug therapy: Secondary | ICD-10-CM | POA: Diagnosis not present

## 2022-09-12 DIAGNOSIS — E538 Deficiency of other specified B group vitamins: Secondary | ICD-10-CM | POA: Diagnosis not present

## 2022-09-19 DIAGNOSIS — E538 Deficiency of other specified B group vitamins: Secondary | ICD-10-CM | POA: Diagnosis not present

## 2022-09-26 DIAGNOSIS — E538 Deficiency of other specified B group vitamins: Secondary | ICD-10-CM | POA: Diagnosis not present

## 2022-10-03 DIAGNOSIS — E538 Deficiency of other specified B group vitamins: Secondary | ICD-10-CM | POA: Diagnosis not present

## 2022-10-22 DIAGNOSIS — F419 Anxiety disorder, unspecified: Secondary | ICD-10-CM | POA: Diagnosis not present

## 2022-10-31 DIAGNOSIS — E538 Deficiency of other specified B group vitamins: Secondary | ICD-10-CM | POA: Diagnosis not present

## 2022-11-30 DIAGNOSIS — T50904A Poisoning by unspecified drugs, medicaments and biological substances, undetermined, initial encounter: Secondary | ICD-10-CM | POA: Diagnosis not present

## 2022-11-30 DIAGNOSIS — I10 Essential (primary) hypertension: Secondary | ICD-10-CM | POA: Diagnosis not present

## 2022-11-30 DIAGNOSIS — T887XXA Unspecified adverse effect of drug or medicament, initial encounter: Secondary | ICD-10-CM | POA: Diagnosis not present

## 2022-11-30 DIAGNOSIS — Z87891 Personal history of nicotine dependence: Secondary | ICD-10-CM | POA: Diagnosis not present

## 2022-11-30 DIAGNOSIS — I6789 Other cerebrovascular disease: Secondary | ICD-10-CM | POA: Diagnosis not present

## 2022-11-30 DIAGNOSIS — F319 Bipolar disorder, unspecified: Secondary | ICD-10-CM | POA: Diagnosis not present

## 2022-11-30 DIAGNOSIS — H02401 Unspecified ptosis of right eyelid: Secondary | ICD-10-CM | POA: Diagnosis not present

## 2022-11-30 DIAGNOSIS — Z8659 Personal history of other mental and behavioral disorders: Secondary | ICD-10-CM | POA: Diagnosis not present

## 2022-11-30 DIAGNOSIS — R5383 Other fatigue: Secondary | ICD-10-CM | POA: Diagnosis not present

## 2022-11-30 DIAGNOSIS — F99 Mental disorder, not otherwise specified: Secondary | ICD-10-CM | POA: Diagnosis not present

## 2022-11-30 DIAGNOSIS — E1169 Type 2 diabetes mellitus with other specified complication: Secondary | ICD-10-CM | POA: Diagnosis not present

## 2022-11-30 DIAGNOSIS — T450X5A Adverse effect of antiallergic and antiemetic drugs, initial encounter: Secondary | ICD-10-CM | POA: Diagnosis not present

## 2022-11-30 DIAGNOSIS — T424X5A Adverse effect of benzodiazepines, initial encounter: Secondary | ICD-10-CM | POA: Diagnosis not present

## 2022-11-30 DIAGNOSIS — G40909 Epilepsy, unspecified, not intractable, without status epilepticus: Secondary | ICD-10-CM | POA: Diagnosis not present

## 2022-11-30 DIAGNOSIS — E1141 Type 2 diabetes mellitus with diabetic mononeuropathy: Secondary | ICD-10-CM | POA: Diagnosis not present

## 2022-11-30 DIAGNOSIS — N3000 Acute cystitis without hematuria: Secondary | ICD-10-CM | POA: Diagnosis not present

## 2022-11-30 DIAGNOSIS — G9340 Encephalopathy, unspecified: Secondary | ICD-10-CM | POA: Diagnosis not present

## 2022-11-30 DIAGNOSIS — E785 Hyperlipidemia, unspecified: Secondary | ICD-10-CM | POA: Diagnosis not present

## 2022-11-30 DIAGNOSIS — R404 Transient alteration of awareness: Secondary | ICD-10-CM | POA: Diagnosis not present

## 2022-11-30 DIAGNOSIS — N3090 Cystitis, unspecified without hematuria: Secondary | ICD-10-CM | POA: Diagnosis not present

## 2022-11-30 DIAGNOSIS — Z7409 Other reduced mobility: Secondary | ICD-10-CM | POA: Diagnosis not present

## 2022-11-30 DIAGNOSIS — G3189 Other specified degenerative diseases of nervous system: Secondary | ICD-10-CM | POA: Diagnosis not present

## 2022-11-30 DIAGNOSIS — R400 Somnolence: Secondary | ICD-10-CM | POA: Diagnosis not present

## 2022-11-30 DIAGNOSIS — R9431 Abnormal electrocardiogram [ECG] [EKG]: Secondary | ICD-10-CM | POA: Diagnosis not present

## 2022-11-30 DIAGNOSIS — F419 Anxiety disorder, unspecified: Secondary | ICD-10-CM | POA: Diagnosis not present

## 2022-11-30 DIAGNOSIS — Z743 Need for continuous supervision: Secondary | ICD-10-CM | POA: Diagnosis not present

## 2022-11-30 DIAGNOSIS — B9620 Unspecified Escherichia coli [E. coli] as the cause of diseases classified elsewhere: Secondary | ICD-10-CM | POA: Diagnosis not present

## 2022-11-30 DIAGNOSIS — Z8669 Personal history of other diseases of the nervous system and sense organs: Secondary | ICD-10-CM | POA: Diagnosis not present

## 2022-11-30 DIAGNOSIS — Z79899 Other long term (current) drug therapy: Secondary | ICD-10-CM | POA: Diagnosis not present

## 2022-11-30 DIAGNOSIS — R4182 Altered mental status, unspecified: Secondary | ICD-10-CM | POA: Diagnosis not present

## 2022-12-18 DIAGNOSIS — I10 Essential (primary) hypertension: Secondary | ICD-10-CM | POA: Diagnosis not present

## 2022-12-18 DIAGNOSIS — G9341 Metabolic encephalopathy: Secondary | ICD-10-CM | POA: Diagnosis not present

## 2022-12-18 DIAGNOSIS — E538 Deficiency of other specified B group vitamins: Secondary | ICD-10-CM | POA: Diagnosis not present

## 2023-01-14 DIAGNOSIS — S63410A Traumatic rupture of collateral ligament of right index finger at metacarpophalangeal and interphalangeal joint, initial encounter: Secondary | ICD-10-CM | POA: Diagnosis not present

## 2023-01-14 DIAGNOSIS — S63252A Unspecified dislocation of right middle finger, initial encounter: Secondary | ICD-10-CM | POA: Diagnosis not present

## 2023-01-14 DIAGNOSIS — M20091 Other deformity of right finger(s): Secondary | ICD-10-CM | POA: Diagnosis not present

## 2023-01-14 DIAGNOSIS — S63282A Dislocation of proximal interphalangeal joint of right middle finger, initial encounter: Secondary | ICD-10-CM | POA: Diagnosis not present

## 2023-01-14 DIAGNOSIS — I10 Essential (primary) hypertension: Secondary | ICD-10-CM | POA: Diagnosis not present

## 2023-01-14 DIAGNOSIS — M79646 Pain in unspecified finger(s): Secondary | ICD-10-CM | POA: Diagnosis not present

## 2023-01-14 DIAGNOSIS — S199XXA Unspecified injury of neck, initial encounter: Secondary | ICD-10-CM | POA: Diagnosis not present

## 2023-01-14 DIAGNOSIS — S0081XA Abrasion of other part of head, initial encounter: Secondary | ICD-10-CM | POA: Diagnosis not present

## 2023-01-14 DIAGNOSIS — M47812 Spondylosis without myelopathy or radiculopathy, cervical region: Secondary | ICD-10-CM | POA: Diagnosis not present

## 2023-01-14 DIAGNOSIS — W1830XA Fall on same level, unspecified, initial encounter: Secondary | ICD-10-CM | POA: Diagnosis not present

## 2023-01-14 DIAGNOSIS — S0181XA Laceration without foreign body of other part of head, initial encounter: Secondary | ICD-10-CM | POA: Diagnosis not present

## 2023-01-14 DIAGNOSIS — W19XXXA Unspecified fall, initial encounter: Secondary | ICD-10-CM | POA: Diagnosis not present

## 2023-01-14 DIAGNOSIS — S299XXA Unspecified injury of thorax, initial encounter: Secondary | ICD-10-CM | POA: Diagnosis not present

## 2023-01-14 DIAGNOSIS — S0990XA Unspecified injury of head, initial encounter: Secondary | ICD-10-CM | POA: Diagnosis not present

## 2023-01-20 DIAGNOSIS — F319 Bipolar disorder, unspecified: Secondary | ICD-10-CM | POA: Diagnosis not present

## 2023-01-20 DIAGNOSIS — E538 Deficiency of other specified B group vitamins: Secondary | ICD-10-CM | POA: Diagnosis not present

## 2023-01-20 DIAGNOSIS — S0990XD Unspecified injury of head, subsequent encounter: Secondary | ICD-10-CM | POA: Diagnosis not present

## 2023-01-20 DIAGNOSIS — S63258D Unspecified dislocation of other finger, subsequent encounter: Secondary | ICD-10-CM | POA: Diagnosis not present

## 2023-01-27 DIAGNOSIS — R634 Abnormal weight loss: Secondary | ICD-10-CM | POA: Diagnosis not present

## 2023-01-27 DIAGNOSIS — Z8659 Personal history of other mental and behavioral disorders: Secondary | ICD-10-CM | POA: Diagnosis not present

## 2023-01-27 DIAGNOSIS — E876 Hypokalemia: Secondary | ICD-10-CM | POA: Diagnosis not present

## 2023-02-02 DIAGNOSIS — E119 Type 2 diabetes mellitus without complications: Secondary | ICD-10-CM | POA: Diagnosis not present

## 2023-02-02 DIAGNOSIS — E782 Mixed hyperlipidemia: Secondary | ICD-10-CM | POA: Diagnosis not present

## 2023-02-02 DIAGNOSIS — F3170 Bipolar disorder, currently in remission, most recent episode unspecified: Secondary | ICD-10-CM | POA: Diagnosis not present

## 2023-04-30 DIAGNOSIS — S0081XA Abrasion of other part of head, initial encounter: Secondary | ICD-10-CM | POA: Diagnosis not present

## 2023-04-30 DIAGNOSIS — S0083XA Contusion of other part of head, initial encounter: Secondary | ICD-10-CM | POA: Diagnosis not present

## 2023-04-30 DIAGNOSIS — S0993XA Unspecified injury of face, initial encounter: Secondary | ICD-10-CM | POA: Diagnosis not present

## 2023-04-30 DIAGNOSIS — S63283A Dislocation of proximal interphalangeal joint of left middle finger, initial encounter: Secondary | ICD-10-CM | POA: Diagnosis not present

## 2023-04-30 DIAGNOSIS — S0080XA Unspecified superficial injury of other part of head, initial encounter: Secondary | ICD-10-CM | POA: Diagnosis not present

## 2023-04-30 DIAGNOSIS — W109XXA Fall (on) (from) unspecified stairs and steps, initial encounter: Secondary | ICD-10-CM | POA: Diagnosis not present

## 2023-04-30 DIAGNOSIS — S63279A Dislocation of unspecified interphalangeal joint of unspecified finger, initial encounter: Secondary | ICD-10-CM | POA: Diagnosis not present

## 2023-04-30 DIAGNOSIS — S0990XA Unspecified injury of head, initial encounter: Secondary | ICD-10-CM | POA: Diagnosis not present

## 2023-04-30 DIAGNOSIS — S00211A Abrasion of right eyelid and periocular area, initial encounter: Secondary | ICD-10-CM | POA: Diagnosis not present

## 2023-04-30 DIAGNOSIS — M79645 Pain in left finger(s): Secondary | ICD-10-CM | POA: Diagnosis not present

## 2023-04-30 DIAGNOSIS — S199XXA Unspecified injury of neck, initial encounter: Secondary | ICD-10-CM | POA: Diagnosis not present

## 2023-04-30 DIAGNOSIS — S63253A Unspecified dislocation of left middle finger, initial encounter: Secondary | ICD-10-CM | POA: Diagnosis not present

## 2023-04-30 DIAGNOSIS — S0031XA Abrasion of nose, initial encounter: Secondary | ICD-10-CM | POA: Diagnosis not present

## 2023-05-26 DIAGNOSIS — F13159 Sedative, hypnotic or anxiolytic abuse with sedative, hypnotic or anxiolytic-induced psychotic disorder, unspecified: Secondary | ICD-10-CM | POA: Diagnosis not present

## 2023-05-26 DIAGNOSIS — R531 Weakness: Secondary | ICD-10-CM | POA: Diagnosis not present

## 2023-05-26 DIAGNOSIS — G928 Other toxic encephalopathy: Secondary | ICD-10-CM | POA: Diagnosis not present

## 2023-05-26 DIAGNOSIS — R0689 Other abnormalities of breathing: Secondary | ICD-10-CM | POA: Diagnosis not present

## 2023-05-26 DIAGNOSIS — R7303 Prediabetes: Secondary | ICD-10-CM | POA: Diagnosis not present

## 2023-05-26 DIAGNOSIS — J9601 Acute respiratory failure with hypoxia: Secondary | ICD-10-CM | POA: Diagnosis not present

## 2023-05-26 DIAGNOSIS — F209 Schizophrenia, unspecified: Secondary | ICD-10-CM | POA: Diagnosis not present

## 2023-05-26 DIAGNOSIS — I10 Essential (primary) hypertension: Secondary | ICD-10-CM | POA: Diagnosis not present

## 2023-05-26 DIAGNOSIS — T50912A Poisoning by multiple unspecified drugs, medicaments and biological substances, intentional self-harm, initial encounter: Secondary | ICD-10-CM | POA: Diagnosis not present

## 2023-05-26 DIAGNOSIS — I959 Hypotension, unspecified: Secondary | ICD-10-CM | POA: Diagnosis not present

## 2023-05-26 DIAGNOSIS — F319 Bipolar disorder, unspecified: Secondary | ICD-10-CM | POA: Diagnosis not present

## 2023-05-26 DIAGNOSIS — F05 Delirium due to known physiological condition: Secondary | ICD-10-CM | POA: Diagnosis not present

## 2023-05-26 DIAGNOSIS — F602 Antisocial personality disorder: Secondary | ICD-10-CM | POA: Diagnosis not present

## 2023-05-26 DIAGNOSIS — R8271 Bacteriuria: Secondary | ICD-10-CM | POA: Diagnosis not present

## 2023-05-26 DIAGNOSIS — T43202A Poisoning by unspecified antidepressants, intentional self-harm, initial encounter: Secondary | ICD-10-CM | POA: Diagnosis not present

## 2023-05-26 DIAGNOSIS — R0902 Hypoxemia: Secondary | ICD-10-CM | POA: Diagnosis not present

## 2023-05-26 DIAGNOSIS — R001 Bradycardia, unspecified: Secondary | ICD-10-CM | POA: Diagnosis not present

## 2023-05-26 DIAGNOSIS — T43012D Poisoning by tricyclic antidepressants, intentional self-harm, subsequent encounter: Secondary | ICD-10-CM | POA: Diagnosis not present

## 2023-05-26 DIAGNOSIS — R419 Unspecified symptoms and signs involving cognitive functions and awareness: Secondary | ICD-10-CM | POA: Diagnosis not present

## 2023-05-26 DIAGNOSIS — F1390 Sedative, hypnotic, or anxiolytic use, unspecified, uncomplicated: Secondary | ICD-10-CM | POA: Diagnosis not present

## 2023-05-26 DIAGNOSIS — Z79899 Other long term (current) drug therapy: Secondary | ICD-10-CM | POA: Diagnosis not present

## 2023-05-26 DIAGNOSIS — T424X2D Poisoning by benzodiazepines, intentional self-harm, subsequent encounter: Secondary | ICD-10-CM | POA: Diagnosis not present

## 2023-05-26 DIAGNOSIS — E782 Mixed hyperlipidemia: Secondary | ICD-10-CM | POA: Diagnosis not present

## 2023-05-26 DIAGNOSIS — R45851 Suicidal ideations: Secondary | ICD-10-CM | POA: Diagnosis not present

## 2023-05-26 DIAGNOSIS — D72829 Elevated white blood cell count, unspecified: Secondary | ICD-10-CM | POA: Diagnosis not present

## 2023-05-26 DIAGNOSIS — R400 Somnolence: Secondary | ICD-10-CM | POA: Diagnosis not present

## 2023-05-26 DIAGNOSIS — F4325 Adjustment disorder with mixed disturbance of emotions and conduct: Secondary | ICD-10-CM | POA: Diagnosis not present

## 2023-05-26 DIAGNOSIS — T43012A Poisoning by tricyclic antidepressants, intentional self-harm, initial encounter: Secondary | ICD-10-CM | POA: Diagnosis not present

## 2023-05-26 DIAGNOSIS — G2401 Drug induced subacute dyskinesia: Secondary | ICD-10-CM | POA: Diagnosis not present

## 2023-05-26 DIAGNOSIS — T424X2A Poisoning by benzodiazepines, intentional self-harm, initial encounter: Secondary | ICD-10-CM | POA: Diagnosis not present

## 2023-05-26 DIAGNOSIS — F3113 Bipolar disorder, current episode manic without psychotic features, severe: Secondary | ICD-10-CM | POA: Diagnosis not present

## 2023-05-26 DIAGNOSIS — G9340 Encephalopathy, unspecified: Secondary | ICD-10-CM | POA: Diagnosis not present

## 2023-05-26 DIAGNOSIS — R4182 Altered mental status, unspecified: Secondary | ICD-10-CM | POA: Diagnosis not present

## 2023-05-26 DIAGNOSIS — R404 Transient alteration of awareness: Secondary | ICD-10-CM | POA: Diagnosis not present

## 2023-05-26 DIAGNOSIS — E878 Other disorders of electrolyte and fluid balance, not elsewhere classified: Secondary | ICD-10-CM | POA: Diagnosis not present

## 2023-05-26 DIAGNOSIS — Y92009 Unspecified place in unspecified non-institutional (private) residence as the place of occurrence of the external cause: Secondary | ICD-10-CM | POA: Diagnosis not present

## 2023-05-26 DIAGNOSIS — T50992A Poisoning by other drugs, medicaments and biological substances, intentional self-harm, initial encounter: Secondary | ICD-10-CM | POA: Diagnosis not present

## 2023-05-26 DIAGNOSIS — Z66 Do not resuscitate: Secondary | ICD-10-CM | POA: Diagnosis not present

## 2023-05-26 DIAGNOSIS — D696 Thrombocytopenia, unspecified: Secondary | ICD-10-CM | POA: Diagnosis not present

## 2023-05-26 DIAGNOSIS — T50902A Poisoning by unspecified drugs, medicaments and biological substances, intentional self-harm, initial encounter: Secondary | ICD-10-CM | POA: Diagnosis not present

## 2023-05-26 DIAGNOSIS — R45850 Homicidal ideations: Secondary | ICD-10-CM | POA: Diagnosis not present

## 2023-05-26 DIAGNOSIS — R55 Syncope and collapse: Secondary | ICD-10-CM | POA: Diagnosis not present

## 2023-05-26 DIAGNOSIS — Y929 Unspecified place or not applicable: Secondary | ICD-10-CM | POA: Diagnosis not present

## 2023-05-26 DIAGNOSIS — G3184 Mild cognitive impairment, so stated: Secondary | ICD-10-CM | POA: Diagnosis not present

## 2023-05-30 NOTE — Discharge Summary (Signed)
 General Medicine Discharge Summary  Terry Baxter, 77 year male, DOB:07/18/1946.  RACHELL MRN: [id number] Admitted: 05/26/2023 12:49 PM Discharged: 06/03/2023 Discharging Attending: Marcey Paddy MD PCP: No Name Self   Code Status: DNAR-A  Disposition:   Disposition: Patient discharged to: Inpatient Psychiatry Unit  PCP Action Items/Follow-Up Recommendations:      Code status : -DNR   Follow-up Appointments:   Future Appointments  Date Time Provider Department Center  07/08/2023 10:00 AM Delora Gaucher, NP [facility] [place]    Reason for Admission:   Admitting Diagnosis: Polysubstance overdose   Principal Diagnosis for Hospital Admission (after further review): Polysubstance overdose (Clonazepam , Doxepine, Melatonin) Likely suicide attempt Bipolar 1 disorder vs substance-induced psychosis 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  Patient was brought in by EMS after daughter found him down with a suicide note by his bible. EMS found 2 bottles of clonazepam  (1 mg tabs) filled in April and July of this year, half a bottle of melatonin, and a bottle of doxepin  (75 mg capsules).    Per EMS report, the patient was talking about wanting to kill himself since his wife died; however, his daughter stated that he has never been married. She recently moved him to live in [place] with her. For the past 6 years, he was living with his sister in [place].    She stated that he has been taking 6 tabs of Zquil nightly for the last 6 years. She does not let him do this now. She notes that it has been difficult for him to adjust to the changes with living with her.   He was admitted to the ICU for stabilization. After stabilization he was transferred to the floor and continued to express HI for which  psychiatry has recommended discharge to an inpatient psychiatyr unit.    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 He was initial hypotensive on arrival and fluid resuscitated. Pt became HDS with unremarkable labs. Given prolonged half life of Clonazepam  and somnolence, admitted to MICU for airway monitoring. MICU and acute care floor stay was uneventful. His mental status improved to baseline   -Patient is now cleared from a medical perspective but now requires inpatient psychiatric admission due to his continued SI and HI. - Continue to hold pta clonazepam  and doxepin      Medication management to be determined by accepting psychiatry facility    Discharge Medications:     What to Do With Your Medications     STOP taking these medications    clonazePAM  1 MG tablet Commonly known as: KlonoPIN    doxepin  75 MG capsule Commonly known as: SINEQUAN           Not on File    Subjective/24 Hour Events:   - Patient denies pain this morning - States he wants to leave the hospital  Discharge Physical Exam:    Vitals BP: 113/64 Heart Rate: 65 Temp: 36.9 C (98.5 F) Resp: 18 SpO2: 96 % Weight - Scale: (!) 100.8 kg (222 lb 3.6 oz) BMI (Calculated): 34.2      CONSTITUTIONAL: 89 year-year-old male in NAD.  CARDIO:  RRR PULM: CTAB breathing comfortably on room air  MSK: Normal range of motion. No peripheral edema.  SKIN: Warm and dry. No rashes noted on gross exam.  NEURO: Alert and oriented   Significant Results:  Key Lab results: Lab Results  Component Value Date   WBC 10.65 05/29/2023   RBC 4.01 (L) 05/29/2023   HGB 12.4 (L) 05/29/2023   HCT 37.2 (L) 05/29/2023   MCV 92.8 05/29/2023   MCH 30.9 05/29/2023   RDW 14.1 (H)  05/29/2023   PLT 151 05/29/2023      Chemistry      Component Value Date/Time   NA 142 05/29/2023 0513   K 3.5 05/29/2023 0513   CL 113 (H) 05/29/2023 0513   CO2 25 05/29/2023 0513   BUN 15 05/29/2023 0513   CREATININE 1.1 05/29/2023 0513   GLU 97 05/29/2023 0513      Component Value Date/Time   CALCIUM  8.6 05/29/2023 0513   ALKPHOS 123 05/26/2023 1300   AST 24 05/26/2023 1300   ALT <6 05/26/2023 1300   BILITOT 0.3 05/26/2023 1300       Key imaging results and incidental findings: None  Tests Pending at Discharge:  Labs/Pathology Pending:   Imaging Pending:  Outstanding Imaging Orders (From admission, onward)    None        Discharge Instructions    Discharge Instructions   None    Flowsheet Data By Column (Last 8 Hours)     Oxygen     Date/Time O2 Flow Rate (L/min) O2 Delivery Device SpO2   05/30/23 0804 --  Room air  99 %             06/03/2023  16:28         Required Comorbidity Documentation    Solid Tumor / Metastases Is a solid tumor suspected, monitored, or managed during this admission?: No  Neurological (Encephalopathy)  Is encephalopathy suspected, monitored, or managed during this admission?: Yes  Heart Failure   Is heart failure suspected, monitored, or managed during this admission?: No  Weight Loss Body mass index is 33.79 kg/m. Is weight loss, malnutrition, or cachexia suspected, monitored or managed during this admission?: No  Coagulopathy  Is coagulopathy suspected, monitored or managed during this admission?: No  Chronic Renal Failure  Is chronic renal failure suspected, monitored or managed during this admission?: No  Dementia  Is dementia suspected, monitored or managed during this admission?: No  Almarie Claw, MD Internal Medicine PGY-2  [facility] [phone]   I have personally interviewed and examined the patient on 06/03/23. I have reviewed the provider's history, physical exam, assessment and treatment  plans. I concur with or have edited all elements of the provider's note.  Marsa Chillington, MD

## 2023-08-24 DIAGNOSIS — H43391 Other vitreous opacities, right eye: Secondary | ICD-10-CM | POA: Diagnosis not present

## 2023-08-24 DIAGNOSIS — H52203 Unspecified astigmatism, bilateral: Secondary | ICD-10-CM | POA: Diagnosis not present

## 2023-08-24 DIAGNOSIS — Z961 Presence of intraocular lens: Secondary | ICD-10-CM | POA: Diagnosis not present

## 2023-08-24 DIAGNOSIS — H43399 Other vitreous opacities, unspecified eye: Secondary | ICD-10-CM | POA: Diagnosis not present

## 2023-08-24 DIAGNOSIS — H5213 Myopia, bilateral: Secondary | ICD-10-CM | POA: Diagnosis not present

## 2023-08-25 DIAGNOSIS — S92351A Displaced fracture of fifth metatarsal bone, right foot, initial encounter for closed fracture: Secondary | ICD-10-CM | POA: Diagnosis not present

## 2023-08-25 DIAGNOSIS — M79671 Pain in right foot: Secondary | ICD-10-CM | POA: Diagnosis not present

## 2023-08-25 DIAGNOSIS — M2061 Acquired deformities of toe(s), unspecified, right foot: Secondary | ICD-10-CM | POA: Diagnosis not present

## 2023-08-25 DIAGNOSIS — M19071 Primary osteoarthritis, right ankle and foot: Secondary | ICD-10-CM | POA: Diagnosis not present

## 2023-09-29 DIAGNOSIS — S92353D Displaced fracture of fifth metatarsal bone, unspecified foot, subsequent encounter for fracture with routine healing: Secondary | ICD-10-CM | POA: Diagnosis not present

## 2023-09-29 DIAGNOSIS — S92351D Displaced fracture of fifth metatarsal bone, right foot, subsequent encounter for fracture with routine healing: Secondary | ICD-10-CM | POA: Diagnosis not present

## 2023-10-26 DIAGNOSIS — Z87440 Personal history of urinary (tract) infections: Secondary | ICD-10-CM | POA: Diagnosis not present

## 2023-10-26 DIAGNOSIS — R7303 Prediabetes: Secondary | ICD-10-CM | POA: Diagnosis not present

## 2023-10-26 DIAGNOSIS — E559 Vitamin D deficiency, unspecified: Secondary | ICD-10-CM | POA: Diagnosis not present

## 2023-10-26 DIAGNOSIS — E538 Deficiency of other specified B group vitamins: Secondary | ICD-10-CM | POA: Diagnosis not present

## 2023-10-26 DIAGNOSIS — Z8673 Personal history of transient ischemic attack (TIA), and cerebral infarction without residual deficits: Secondary | ICD-10-CM | POA: Diagnosis not present

## 2023-10-26 DIAGNOSIS — Z111 Encounter for screening for respiratory tuberculosis: Secondary | ICD-10-CM | POA: Diagnosis not present

## 2023-10-26 DIAGNOSIS — Z9884 Bariatric surgery status: Secondary | ICD-10-CM | POA: Diagnosis not present

## 2023-10-26 DIAGNOSIS — F319 Bipolar disorder, unspecified: Secondary | ICD-10-CM | POA: Diagnosis not present

## 2023-11-09 DIAGNOSIS — R7303 Prediabetes: Secondary | ICD-10-CM | POA: Diagnosis not present

## 2023-11-09 DIAGNOSIS — Z66 Do not resuscitate: Secondary | ICD-10-CM | POA: Diagnosis not present

## 2023-11-09 DIAGNOSIS — F319 Bipolar disorder, unspecified: Secondary | ICD-10-CM | POA: Diagnosis not present

## 2023-12-29 ENCOUNTER — Other Ambulatory Visit: Payer: Self-pay

## 2023-12-29 ENCOUNTER — Emergency Department (HOSPITAL_COMMUNITY)
Admission: EM | Admit: 2023-12-29 | Discharge: 2023-12-29 | Disposition: A | Discharge status: Home / Self Care | Attending: Emergency Medicine | Admitting: Emergency Medicine

## 2023-12-29 ENCOUNTER — Other Ambulatory Visit (HOSPITAL_BASED_OUTPATIENT_CLINIC_OR_DEPARTMENT_OTHER): Payer: Self-pay

## 2023-12-29 ENCOUNTER — Other Ambulatory Visit (HOSPITAL_COMMUNITY): Payer: Self-pay

## 2023-12-29 ENCOUNTER — Emergency Department (HOSPITAL_COMMUNITY)

## 2023-12-29 ENCOUNTER — Encounter (HOSPITAL_COMMUNITY): Payer: Self-pay

## 2023-12-29 DIAGNOSIS — Z743 Need for continuous supervision: Secondary | ICD-10-CM | POA: Diagnosis not present

## 2023-12-29 DIAGNOSIS — R030 Elevated blood-pressure reading, without diagnosis of hypertension: Secondary | ICD-10-CM | POA: Diagnosis not present

## 2023-12-29 DIAGNOSIS — W19XXXA Unspecified fall, initial encounter: Secondary | ICD-10-CM | POA: Diagnosis not present

## 2023-12-29 DIAGNOSIS — E860 Dehydration: Secondary | ICD-10-CM | POA: Diagnosis not present

## 2023-12-29 DIAGNOSIS — R42 Dizziness and giddiness: Secondary | ICD-10-CM | POA: Diagnosis not present

## 2023-12-29 DIAGNOSIS — R531 Weakness: Secondary | ICD-10-CM | POA: Diagnosis not present

## 2023-12-29 DIAGNOSIS — N3000 Acute cystitis without hematuria: Secondary | ICD-10-CM | POA: Diagnosis not present

## 2023-12-29 DIAGNOSIS — R55 Syncope and collapse: Secondary | ICD-10-CM | POA: Diagnosis not present

## 2023-12-29 DIAGNOSIS — R000 Tachycardia, unspecified: Secondary | ICD-10-CM | POA: Diagnosis not present

## 2023-12-29 DIAGNOSIS — E6609 Other obesity due to excess calories: Secondary | ICD-10-CM | POA: Diagnosis not present

## 2023-12-29 DIAGNOSIS — I959 Hypotension, unspecified: Secondary | ICD-10-CM | POA: Diagnosis not present

## 2023-12-29 DIAGNOSIS — R5383 Other fatigue: Secondary | ICD-10-CM | POA: Diagnosis not present

## 2023-12-29 LAB — COMPREHENSIVE METABOLIC PANEL WITH GFR
ALT: 7 U/L (ref 0–44)
AST: 18 U/L (ref 15–41)
Albumin: 3.6 g/dL (ref 3.5–5.0)
Alkaline Phosphatase: 98 U/L (ref 38–126)
Anion gap: 10 (ref 5–15)
BUN: 9 mg/dL (ref 8–23)
CO2: 24 mmol/L (ref 22–32)
Calcium: 8.4 mg/dL — ABNORMAL LOW (ref 8.9–10.3)
Chloride: 103 mmol/L (ref 98–111)
Creatinine, Ser: 1.24 mg/dL (ref 0.61–1.24)
GFR, Estimated: >60 mL/min (ref >60)
Glucose, Bld: 103 mg/dL — ABNORMAL HIGH (ref 70–99)
Potassium: 2.9 mmol/L — ABNORMAL LOW (ref 3.5–5.1)
Sodium: 137 mmol/L (ref 135–145)
Total Bilirubin: 0.9 mg/dL (ref 0.0–1.2)
Total Protein: 7.2 g/dL (ref 6.5–8.1)

## 2023-12-29 LAB — CBC
HCT: 43.1 % (ref 39.0–52.0)
Hemoglobin: 14.2 g/dL (ref 13.0–17.0)
MCH: 31.1 pg (ref 26.0–34.0)
MCHC: 32.9 g/dL (ref 30.0–36.0)
MCV: 94.5 fL (ref 80.0–100.0)
Platelets: 175 10*3/uL (ref 150–400)
RBC: 4.56 MIL/uL (ref 4.22–5.81)
RDW: 14.3 % (ref 11.5–15.5)
WBC: 6.3 10*3/uL (ref 4.0–10.5)
nRBC: 0 % (ref 0.0–0.2)

## 2023-12-29 LAB — URINALYSIS, ROUTINE W REFLEX MICROSCOPIC
Bilirubin Urine: NEGATIVE
Glucose, UA: NEGATIVE mg/dL
Ketones, ur: NEGATIVE mg/dL
Nitrite: POSITIVE — AB
Protein, ur: NEGATIVE mg/dL
Specific Gravity, Urine: 1.008 (ref 1.005–1.030)
pH: 5 (ref 5.0–8.0)

## 2023-12-29 MED ORDER — SODIUM CHLORIDE 0.9 % IV BOLUS
1000.0000 mL | Freq: Once | INTRAVENOUS | Status: AC
  Administered 2023-12-29: 1000 mL via INTRAVENOUS

## 2023-12-29 MED ORDER — CEPHALEXIN 500 MG PO CAPS
1000.0000 mg | ORAL_CAPSULE | Freq: Two times a day (BID) | ORAL | 0 refills | Status: AC
  Filled 2023-12-29: qty 28, 7d supply, fill #0

## 2023-12-29 MED ORDER — SODIUM CHLORIDE 0.9 % IV SOLN
1.0000 g | Freq: Once | INTRAVENOUS | Status: AC
  Administered 2023-12-29: 1 g via INTRAVENOUS
  Filled 2023-12-29: qty 10

## 2023-12-29 MED ORDER — MAGNESIUM OXIDE -MG SUPPLEMENT 400 (240 MG) MG PO TABS
800.0000 mg | ORAL_TABLET | Freq: Once | ORAL | Status: AC
  Administered 2023-12-29: 800 mg via ORAL
  Filled 2023-12-29: qty 2

## 2023-12-29 MED ORDER — POTASSIUM CHLORIDE 20 MEQ PO PACK
60.0000 meq | PACK | Freq: Two times a day (BID) | ORAL | Status: DC
  Administered 2023-12-29: 60 meq via ORAL
  Filled 2023-12-29: qty 3

## 2023-12-29 NOTE — ED Provider Notes (Signed)
 [facility] EMERGENCY DEPARTMENT AT [facility] Provider Note   CSN: [id number] Arrival date & time: 12/29/23  1113     History  Chief Complaint  Patient presents with   Hypotension   Near Syncope    Terry Baxter is a 78 y.o. male.  78 yo M with a chief complaints of generalized fatigue.  He tells me this been going on for months.  He was seeing his doctor today to consider a medication change and while he was there he almost passed out.  He said he was walking in the office and suddenly felt unwell and then went down to his knees.  He denies any injury denies loss of consciousness.  He denies cough congestion or fever denies nausea vomiting or diarrhea denies headache neck pain chest pain or shortness of breath.  He feels fine now.  Has been fatigued and lightheaded for months.  Was told that he had low blood pressure for some reason.  He tells me he does not like to drink water because it does not taste good.   Near Syncope       Home Medications Prior to Admission medications   Medication Sig Start Date End Date Taking? Authorizing Provider  cephALEXin (KEFLEX) 500 MG capsule Take 2 capsules (1,000 mg total) by mouth 2 (two) times daily for 7 days. 12/29/23 01/05/24 Yes Albertus Hughs, DO  amoxicillin  (AMOXIL ) 500 MG capsule Take 1 capsule (500 mg total) by mouth 3 (three) times daily. 02/01/17   Sheikh, Omair Latif, DO  bacitracin  ointment Apply topically 2 (two) times daily. 02/01/17   Aura Leeds Latif, DO  buPROPion  (WELLBUTRIN  XL) 300 MG 24 hr tablet Take 300 mg by mouth daily with breakfast. 06/02/16   [provider]  clonazePAM  (KLONOPIN ) 0.5 MG tablet Take 1 tablet (0.5 mg total) by mouth 3 (three) times daily as needed for anxiety. 01/09/17   Aldean Hummingbird, MD  folic acid  (FOLVITE ) 1 MG tablet Take 1 tablet (1 mg total) by mouth daily. 02/02/17   Aura Leeds Latif, DO  gabapentin  (NEURONTIN ) 300 MG capsule Take 300 mg by mouth 2 (two) times daily. 12/05/16    [provider]  levETIRAcetam  (KEPPRA ) 500 MG tablet Take 500 mg by mouth 2 (two) times daily.    [provider]  lidocaine  (LIDODERM ) 5 % Place 1 patch onto the skin daily. Remove & Discard patch within 12 hours or as directed by MD 02/01/17   Sheikh, Omair Latif, DO  Multiple Vitamin (MULTIVITAMIN WITH MINERALS) TABS tablet Take 1 tablet by mouth daily.    [provider]  oxyCODONE -acetaminophen  (ROXICET) 5-325 MG/5ML solution Take 5 mLs by mouth every 6 (six) hours as needed. Take (5 mg) for mild pain and 10 mls (10mg ) for severe pain.     [provider]  polyethylene glycol (MIRALAX  / GLYCOLAX ) packet Take 17 g by mouth daily as needed for mild constipation.    [provider]  Propylene Glycol (SYSTANE BALANCE) 0.6 % SOLN Place 1 drop into both eyes 3 (three) times daily.    [provider]  risperiDONE  (RISPERDAL ) 1 MG tablet Take 1 mg by mouth 3 (three) times daily.  02/06/16   [provider]  senna-docusate (SENOKOT-S) 8.6-50 MG tablet Take 1 tablet by mouth 2 (two) times daily. 02/01/17   Sheikh, Omair Latif, DO  thiamine  100 MG tablet Take 1 tablet (100 mg total) by mouth daily. 02/02/17   Eveline Hipps, DO  Allergies    Tizanidine and Vicodin [hydrocodone -acetaminophen ]    Review of Systems   Review of Systems  Cardiovascular:  Positive for near-syncope.    Physical Exam Updated Vital Signs BP (!) 142/86 (BP Location: Left Arm)   Pulse 72   Temp 98.3 F (36.8 C) (Oral)   Resp (!) 23   Ht 5\' 6"  (1.676 m)   Wt 89 kg   SpO2 99%   BMI 31.67 kg/m  Physical Exam Vitals and nursing note reviewed.  Constitutional:      Appearance: He is well-developed.  HENT:     Head: Normocephalic and atraumatic.  Eyes:     Pupils: Pupils are equal, round, and reactive to light.  Neck:     Vascular: No JVD.  Cardiovascular:     Rate and Rhythm: Normal rate and regular rhythm.     Heart sounds: No murmur  heard.    No friction rub. No gallop.  Pulmonary:     Effort: No respiratory distress.     Breath sounds: No wheezing.  Abdominal:     General: There is no distension.     Tenderness: There is no abdominal tenderness. There is no guarding or rebound.  Musculoskeletal:        General: Normal range of motion.     Cervical back: Normal range of motion and neck supple.  Skin:    Coloration: Skin is not pale.     Findings: No rash.  Neurological:     Mental Status: He is alert and oriented to person, place, and time.  Psychiatric:        Behavior: Behavior normal.     ED Results / Procedures / Treatments   Labs (all labs ordered are listed, but only abnormal results are displayed) Labs Reviewed  COMPREHENSIVE METABOLIC PANEL WITH GFR - Abnormal; Notable for the following components:      Result Value   Potassium 2.9 (*)    Glucose, Bld 103 (*)    Calcium  8.4 (*)    All other components within normal limits  URINALYSIS, ROUTINE W REFLEX MICROSCOPIC - Abnormal; Notable for the following components:   APPearance HAZY (*)    Hgb urine dipstick SMALL (*)    Nitrite POSITIVE (*)    Leukocytes,Ua TRACE (*)    Bacteria, UA MANY (*)    All other components within normal limits  CBC    EKG EKG Interpretation Date/Time:  Tuesday December 29 2023 11:27:09 EDT Ventricular Rate:  76 PR Interval:  186 QRS Duration:  84 QT Interval:  323 QTC Calculation: 364 R Axis:   58  Text Interpretation: Sinus rhythm Low voltage, precordial leads Borderline T abnormalities, anterior leads No significant change since last tracing Confirmed by Albertus Hughs [id number]) on 12/29/2023 1:36:53 PM  Radiology DG Chest Port 1 View Result Date: 12/29/2023 CLINICAL DATA:  Fatigue.  Near-syncope. EXAM: PORTABLE CHEST 1 VIEW COMPARISON:  01/29/2017. FINDINGS: The heart size and mediastinal contours are within normal limits. No focal consolidation, sizeable pleural effusion, or pneumothorax. No acute osseous abnormality.  IMPRESSION: No acute cardiopulmonary findings. Electronically Signed   By: Mannie Seek M.D.   On: 12/29/2023 14:30    Procedures Procedures    Medications Ordered in ED Medications  potassium chloride  (KLOR-CON ) packet 60 mEq (60 mEq Oral Given 12/29/23 1343)  cefTRIAXone  (ROCEPHIN ) 1 g in sodium chloride  0.9 % 100 mL IVPB (has no administration in time range)  sodium chloride  0.9 % bolus 1,000 mL (1,000 mLs  Intravenous New Bag/Given 12/29/23 1255)  magnesium oxide (MAG-OX) tablet 800 mg (800 mg Oral Given 12/29/23 1343)    ED Course/ Medical Decision Making/ A&P                                 Medical Decision Making Amount and/or Complexity of Data Reviewed Labs: ordered. Radiology: ordered.  Risk OTC drugs. Prescription drug management.   78 yo M with a chief complaints of not feeling well.  He said this been going on for months.  Was told that he has low blood pressure at baseline.  Unknown etiology.  He said this was diagnosed in [place]  and then has lived in [place] and sounds like he recently has moved here.  He was seeing a doctor today because he says has been salivating a lot.  The makes him not want to drink all that much.  He almost passed out of the office and they sent him here for evaluation.  He denies chest pain denies difficulty breathing denies headache neck pain denies nausea vomiting or diarrhea.  By history it sounds like the patient is hypovolemic and looks ill on exam.  Will give a bolus of IV fluids.  Blood work.  Reassess.  Patient's potassium is 2.9 will replenish.  Patient's urine is concerning for urinary tract infection nitrite positive to numerous Bacteria.  I did offer admission patient however would prefer to go home.  Will give an IV dose of antibiotics here.  Discharge home.  PCP follow-up.  3:10 PM:  I have discussed the diagnosis/risks/treatment options with the patient.  Evaluation and diagnostic testing in the emergency department does  not suggest an emergent condition requiring admission or immediate intervention beyond what has been performed at this time.  They will follow up with PCP. We also discussed returning to the ED immediately if new or worsening sx occur. We discussed the sx which are most concerning (e.g., sudden worsening pain, fever, inability to tolerate by mouth) that necessitate immediate return. Medications administered to the patient during their visit and any new prescriptions provided to the patient are listed below.  Medications given during this visit Medications  potassium chloride  (KLOR-CON ) packet 60 mEq (60 mEq Oral Given 12/29/23 1343)  cefTRIAXone  (ROCEPHIN ) 1 g in sodium chloride  0.9 % 100 mL IVPB (has no administration in time range)  sodium chloride  0.9 % bolus 1,000 mL (1,000 mLs Intravenous New Bag/Given 12/29/23 1255)  magnesium oxide (MAG-OX) tablet 800 mg (800 mg Oral Given 12/29/23 1343)     The patient appears reasonably screen and/or stabilized for discharge and I doubt any other medical condition or other [facility] requiring further screening, evaluation, or treatment in the ED at this time prior to discharge.          Final Clinical Impression(s) / ED Diagnoses Final diagnoses:  Dehydration  Weakness  Acute cystitis without hematuria    Rx / DC Orders ED Discharge Orders          Ordered    cephALEXin (KEFLEX) 500 MG capsule  2 times daily        12/29/23 1508              Albertus Hughs, DO 12/29/23 1510

## 2023-12-29 NOTE — Discharge Instructions (Signed)
 Follow-up with your family doctor in the office.  Take the antibiotics as prescribed.  Please return for worsening symptoms or if you feel he cannot pass out again.  Your potassium was low today.  Please eat a food that is high in potassium with each meal for at least the next week.  Try to follow-up with your family doctor for recheck in a week or 2.

## 2023-12-29 NOTE — ED Notes (Signed)
 Patient aware of the need of urine ample-does not have the urge to void at this time. Urinal and urine cup at bedside.

## 2023-12-29 NOTE — ED Triage Notes (Signed)
 Per EMS, Pt, from new patient PCP visit, presents after a near syncopal episode.  Pt was found to be orthostatic.    Sitting BP 90/60 and Standing 70/56.      Pt reports significant weight loss and loss of appetite x2 months.

## 2024-01-21 DIAGNOSIS — R5383 Other fatigue: Secondary | ICD-10-CM | POA: Diagnosis not present

## 2024-01-21 DIAGNOSIS — Z79899 Other long term (current) drug therapy: Secondary | ICD-10-CM | POA: Diagnosis not present

## 2024-01-21 DIAGNOSIS — Z131 Encounter for screening for diabetes mellitus: Secondary | ICD-10-CM | POA: Diagnosis not present

## 2024-01-21 DIAGNOSIS — Z113 Encounter for screening for infections with a predominantly sexual mode of transmission: Secondary | ICD-10-CM | POA: Diagnosis not present

## 2024-01-21 DIAGNOSIS — Z125 Encounter for screening for malignant neoplasm of prostate: Secondary | ICD-10-CM | POA: Diagnosis not present

## 2024-01-21 DIAGNOSIS — D539 Nutritional anemia, unspecified: Secondary | ICD-10-CM | POA: Diagnosis not present

## 2024-01-21 DIAGNOSIS — Z1159 Encounter for screening for other viral diseases: Secondary | ICD-10-CM | POA: Diagnosis not present

## 2024-01-21 DIAGNOSIS — E559 Vitamin D deficiency, unspecified: Secondary | ICD-10-CM | POA: Diagnosis not present

## 2024-01-26 DIAGNOSIS — R0602 Shortness of breath: Secondary | ICD-10-CM | POA: Diagnosis not present

## 2024-02-02 DIAGNOSIS — F419 Anxiety disorder, unspecified: Secondary | ICD-10-CM | POA: Diagnosis not present

## 2024-02-23 ENCOUNTER — Encounter (HOSPITAL_COMMUNITY): Payer: Self-pay | Admitting: Emergency Medicine

## 2024-02-23 ENCOUNTER — Emergency Department (HOSPITAL_COMMUNITY)
Admission: EM | Admit: 2024-02-23 | Discharge: 2024-02-23 | Disposition: A | Discharge status: Home / Self Care | Attending: Emergency Medicine | Admitting: Emergency Medicine

## 2024-02-23 ENCOUNTER — Other Ambulatory Visit: Payer: Self-pay

## 2024-02-23 ENCOUNTER — Emergency Department (HOSPITAL_COMMUNITY)

## 2024-02-23 DIAGNOSIS — R634 Abnormal weight loss: Secondary | ICD-10-CM | POA: Insufficient documentation

## 2024-02-23 DIAGNOSIS — E860 Dehydration: Secondary | ICD-10-CM | POA: Insufficient documentation

## 2024-02-23 LAB — COMPREHENSIVE METABOLIC PANEL WITH GFR
ALT: 8 U/L (ref 0–44)
AST: 17 U/L (ref 15–41)
Albumin: 3.6 g/dL (ref 3.5–5.0)
Alkaline Phosphatase: 100 U/L (ref 38–126)
Anion gap: 8 (ref 5–15)
BUN: 26 mg/dL — ABNORMAL HIGH (ref 8–23)
CO2: 22 mmol/L (ref 22–32)
Calcium: 9 mg/dL (ref 8.9–10.3)
Chloride: 105 mmol/L (ref 98–111)
Creatinine, Ser: 1.35 mg/dL — ABNORMAL HIGH (ref 0.61–1.24)
GFR, Estimated: 54 mL/min — ABNORMAL LOW (ref >60)
Glucose, Bld: 86 mg/dL (ref 70–99)
Potassium: 3.8 mmol/L (ref 3.5–5.1)
Sodium: 135 mmol/L (ref 135–145)
Total Bilirubin: 1.2 mg/dL (ref 0.0–1.2)
Total Protein: 7.4 g/dL (ref 6.5–8.1)

## 2024-02-23 LAB — CBC
HCT: 42.4 % (ref 39.0–52.0)
Hemoglobin: 13.6 g/dL (ref 13.0–17.0)
MCH: 30.3 pg (ref 26.0–34.0)
MCHC: 32.1 g/dL (ref 30.0–36.0)
MCV: 94.4 fL (ref 80.0–100.0)
Platelets: 187 K/uL (ref 150–400)
RBC: 4.49 MIL/uL (ref 4.22–5.81)
RDW: 13.1 % (ref 11.5–15.5)
WBC: 8.3 K/uL (ref 4.0–10.5)
nRBC: 0 % (ref 0.0–0.2)

## 2024-02-23 MED ORDER — LORATADINE 10 MG PO TABS
10.0000 mg | ORAL_TABLET | Freq: Every day | ORAL | 0 refills | Status: DC
  Filled 2024-02-23: qty 30, 30d supply, fill #0

## 2024-02-23 NOTE — ED Triage Notes (Signed)
 Patient comes in for weight loss in the last several months. Hx of Tardive Dyskinsia. C/o salvia build up states scopolamine  patient make patient unbalanced and sleepy. Having issues with saliva build up due to this.

## 2024-02-23 NOTE — Discharge Instructions (Signed)
Follow-up with your family doctor if not improving 

## 2024-02-24 ENCOUNTER — Other Ambulatory Visit (HOSPITAL_COMMUNITY): Payer: Self-pay

## 2024-02-24 NOTE — ED Provider Notes (Signed)
 [place] EMERGENCY DEPARTMENT AT [facility] Provider Note   CSN: [id number] Arrival date & time: 02/23/24  1242     Patient presents with: salvia build up  and Weight Loss   Terry Baxter is a 78 y.o. male.   Pt. Complains of weight lost and increased saliva.  No vomiting  The history is provided by the patient and medical records. No language interpreter was used.  Weakness Severity:  Mild Onset quality:  Sudden Timing:  Constant Progression:  Worsening Chronicity:  New Context: not alcohol  use   Relieved by:  Nothing Worsened by:  Nothing Ineffective treatments:  None tried Associated symptoms: abdominal pain   Associated symptoms: no chest pain, no cough, no diarrhea, no frequency, no headaches and no seizures        Prior to Admission medications   Medication Sig Start Date End Date Taking? Authorizing Provider  loratadine  (CLARITIN ) 10 MG tablet Take 1 tablet (10 mg total) by mouth daily. 02/23/24  Yes Suzette Pac, MD  amoxicillin  (AMOXIL ) 500 MG capsule Take 1 capsule (500 mg total) by mouth 3 (three) times daily. 02/01/17   Sheikh, Omair Latif, DO  bacitracin  ointment Apply topically 2 (two) times daily. 02/01/17   Sherrill Cable Latif, DO  buPROPion  (WELLBUTRIN  XL) 300 MG 24 hr tablet Take 300 mg by mouth daily with breakfast. 06/02/16   [provider]  clonazePAM  (KLONOPIN ) 0.5 MG tablet Take 1 tablet (0.5 mg total) by mouth 3 (three) times daily as needed for anxiety. 01/09/17   Tanda Locus, MD  folic acid  (FOLVITE ) 1 MG tablet Take 1 tablet (1 mg total) by mouth daily. 02/02/17   Sherrill Cable Latif, DO  gabapentin  (NEURONTIN ) 300 MG capsule Take 300 mg by mouth 2 (two) times daily. 12/05/16   [provider]  levETIRAcetam  (KEPPRA ) 500 MG tablet Take 500 mg by mouth 2 (two) times daily.    [provider]  lidocaine  (LIDODERM ) 5 % Place 1 patch onto the skin daily. Remove & Discard patch within 12 hours or as directed by MD  02/01/17   Sheikh, Omair Latif, DO  Multiple Vitamin (MULTIVITAMIN WITH MINERALS) TABS tablet Take 1 tablet by mouth daily.    [provider]  oxyCODONE -acetaminophen  (ROXICET) 5-325 MG/5ML solution Take 5 mLs by mouth every 6 (six) hours as needed. Take (5 mg) for mild pain and 10 mls (10mg ) for severe pain.     [provider]  polyethylene glycol (MIRALAX  / GLYCOLAX ) packet Take 17 g by mouth daily as needed for mild constipation.    [provider]  Propylene Glycol (SYSTANE BALANCE) 0.6 % SOLN Place 1 drop into both eyes 3 (three) times daily.    [provider]  risperiDONE  (RISPERDAL ) 1 MG tablet Take 1 mg by mouth 3 (three) times daily.  02/06/16   [provider]  senna-docusate (SENOKOT-S) 8.6-50 MG tablet Take 1 tablet by mouth 2 (two) times daily. 02/01/17   Sheikh, Omair Latif, DO  thiamine  100 MG tablet Take 1 tablet (100 mg total) by mouth daily. 02/02/17   Sheikh, Omair Latif, DO    Allergies: Tizanidine and Vicodin [hydrocodone -acetaminophen ]    Review of Systems  Constitutional:  Negative for appetite change and fatigue.  HENT:  Negative for congestion, ear discharge and sinus pressure.   Eyes:  Negative for discharge.  Respiratory:  Negative for cough.   Cardiovascular:  Negative for chest pain.  Gastrointestinal:  Positive for abdominal pain. Negative for diarrhea.  Genitourinary:  Negative for frequency and hematuria.  Musculoskeletal:  Negative for back pain.  Skin:  Negative for rash.  Neurological:  Positive for weakness. Negative for seizures and headaches.  Psychiatric/Behavioral:  Negative for hallucinations.     Updated Vital Signs BP (!) 139/100 (BP Location: Left Arm)   Pulse 74   Temp 97.9 F (36.6 C) (Oral)   Resp 16   Ht 5' 6 (1.676 m)   Wt 87.5 kg   SpO2 100%   BMI 31.15 kg/m   Physical Exam Vitals and nursing note reviewed.  Constitutional:      Appearance: He is well-developed.  HENT:     Head:  Normocephalic.     Nose: Nose normal.  Eyes:     General: No scleral icterus.    Conjunctiva/sclera: Conjunctivae normal.  Neck:     Thyroid: No thyromegaly.  Cardiovascular:     Rate and Rhythm: Normal rate and regular rhythm.     Heart sounds: No murmur heard.    No friction rub. No gallop.  Pulmonary:     Breath sounds: No stridor. No wheezing or rales.  Chest:     Chest wall: No tenderness.  Abdominal:     General: There is no distension.     Tenderness: There is no abdominal tenderness. There is no rebound.  Musculoskeletal:        General: Normal range of motion.     Cervical back: Neck supple.  Lymphadenopathy:     Cervical: No cervical adenopathy.  Skin:    Findings: No erythema or rash.  Neurological:     Mental Status: He is alert and oriented to person, place, and time.     Motor: No abnormal muscle tone.     Coordination: Coordination normal.  Psychiatric:        Behavior: Behavior normal.     (all labs ordered are listed, but only abnormal results are displayed) Labs Reviewed  COMPREHENSIVE METABOLIC PANEL WITH GFR - Abnormal; Notable for the following components:      Result Value   BUN 26 (*)    Creatinine, Ser 1.35 (*)    GFR, Estimated 54 (*)    All other components within normal limits  CBC    EKG: None  Radiology: DG Abdomen Acute W/Chest Result Date: 02/23/2024 CLINICAL DATA:  Weight loss EXAM: DG ABDOMEN ACUTE WITH 1 VIEW CHEST COMPARISON:  12/29/2023 FINDINGS: Heart is normal size. Mediastinal contours within normal limits. Lungs clear. No effusions. Nonobstructive bowel gas pattern. Moderate stool burden in the left colon. No organomegaly, free air or suspicious calcification. IMPRESSION: No acute cardiopulmonary disease. No bowel obstruction or free air. Moderate stool burden in the left colon. Electronically Signed   By: Franky Crease M.D.   On: 02/23/2024 20:30     Procedures   Medications Ordered in the ED - No data to display                                   Medical Decision Making Amount and/or Complexity of Data Reviewed Labs: ordered. Radiology: ordered.  Risk OTC drugs.   Pt with normal basic labs except mild dehydration.  Pt will start some claritin  and follow up with pcp     Final diagnoses:  Weight loss    ED Discharge Orders          Ordered    loratadine  (CLARITIN ) 10 MG tablet  Daily        02/23/24 2131               Suzette Pac, MD 02/24/24 1314

## 2024-03-14 ENCOUNTER — Emergency Department (HOSPITAL_COMMUNITY)

## 2024-03-14 ENCOUNTER — Other Ambulatory Visit: Payer: Self-pay

## 2024-03-14 ENCOUNTER — Observation Stay (HOSPITAL_COMMUNITY)

## 2024-03-14 ENCOUNTER — Inpatient Hospital Stay (HOSPITAL_COMMUNITY)
Admission: EM | Admit: 2024-03-14 | Discharge: 2024-03-23 | DRG: 564 | Disposition: A | Discharge status: Home Health | Attending: Internal Medicine | Admitting: Internal Medicine

## 2024-03-14 ENCOUNTER — Encounter (HOSPITAL_COMMUNITY): Payer: Self-pay

## 2024-03-14 DIAGNOSIS — K117 Disturbances of salivary secretion: Secondary | ICD-10-CM | POA: Diagnosis present

## 2024-03-14 DIAGNOSIS — E860 Dehydration: Secondary | ICD-10-CM | POA: Diagnosis present

## 2024-03-14 DIAGNOSIS — N1831 Chronic kidney disease, stage 3a: Secondary | ICD-10-CM | POA: Diagnosis present

## 2024-03-14 DIAGNOSIS — F4322 Adjustment disorder with anxiety: Secondary | ICD-10-CM | POA: Diagnosis present

## 2024-03-14 DIAGNOSIS — T796XXA Traumatic ischemia of muscle, initial encounter: Principal | ICD-10-CM | POA: Diagnosis present

## 2024-03-14 DIAGNOSIS — R4182 Altered mental status, unspecified: Secondary | ICD-10-CM | POA: Diagnosis not present

## 2024-03-14 DIAGNOSIS — G8929 Other chronic pain: Secondary | ICD-10-CM | POA: Diagnosis present

## 2024-03-14 DIAGNOSIS — R531 Weakness: Secondary | ICD-10-CM | POA: Diagnosis not present

## 2024-03-14 DIAGNOSIS — I129 Hypertensive chronic kidney disease with stage 1 through stage 4 chronic kidney disease, or unspecified chronic kidney disease: Secondary | ICD-10-CM | POA: Diagnosis present

## 2024-03-14 DIAGNOSIS — Z604 Social exclusion and rejection: Secondary | ICD-10-CM | POA: Diagnosis present

## 2024-03-14 DIAGNOSIS — T40605A Adverse effect of unspecified narcotics, initial encounter: Secondary | ICD-10-CM | POA: Diagnosis present

## 2024-03-14 DIAGNOSIS — Y92009 Unspecified place in unspecified non-institutional (private) residence as the place of occurrence of the external cause: Secondary | ICD-10-CM

## 2024-03-14 DIAGNOSIS — Z8619 Personal history of other infectious and parasitic diseases: Secondary | ICD-10-CM | POA: Insufficient documentation

## 2024-03-14 DIAGNOSIS — W19XXXA Unspecified fall, initial encounter: Secondary | ICD-10-CM | POA: Diagnosis present

## 2024-03-14 DIAGNOSIS — R410 Disorientation, unspecified: Principal | ICD-10-CM

## 2024-03-14 DIAGNOSIS — N182 Chronic kidney disease, stage 2 (mild): Secondary | ICD-10-CM | POA: Insufficient documentation

## 2024-03-14 DIAGNOSIS — Z635 Disruption of family by separation and divorce: Secondary | ICD-10-CM

## 2024-03-14 DIAGNOSIS — I10 Essential (primary) hypertension: Secondary | ICD-10-CM

## 2024-03-14 DIAGNOSIS — E1129 Type 2 diabetes mellitus with other diabetic kidney complication: Secondary | ICD-10-CM

## 2024-03-14 DIAGNOSIS — R45851 Suicidal ideations: Secondary | ICD-10-CM | POA: Diagnosis present

## 2024-03-14 DIAGNOSIS — E1122 Type 2 diabetes mellitus with diabetic chronic kidney disease: Secondary | ICD-10-CM | POA: Diagnosis present

## 2024-03-14 DIAGNOSIS — E785 Hyperlipidemia, unspecified: Secondary | ICD-10-CM | POA: Diagnosis present

## 2024-03-14 DIAGNOSIS — G2401 Drug induced subacute dyskinesia: Secondary | ICD-10-CM | POA: Diagnosis present

## 2024-03-14 DIAGNOSIS — E8721 Acute metabolic acidosis: Secondary | ICD-10-CM | POA: Diagnosis not present

## 2024-03-14 DIAGNOSIS — Z6830 Body mass index (BMI) 30.0-30.9, adult: Secondary | ICD-10-CM

## 2024-03-14 DIAGNOSIS — Z886 Allergy status to analgesic agent status: Secondary | ICD-10-CM

## 2024-03-14 DIAGNOSIS — Z79899 Other long term (current) drug therapy: Secondary | ICD-10-CM

## 2024-03-14 DIAGNOSIS — Z8673 Personal history of transient ischemic attack (TIA), and cerebral infarction without residual deficits: Secondary | ICD-10-CM

## 2024-03-14 DIAGNOSIS — F1120 Opioid dependence, uncomplicated: Secondary | ICD-10-CM | POA: Diagnosis present

## 2024-03-14 DIAGNOSIS — G9341 Metabolic encephalopathy: Secondary | ICD-10-CM | POA: Diagnosis present

## 2024-03-14 DIAGNOSIS — R404 Transient alteration of awareness: Secondary | ICD-10-CM | POA: Diagnosis not present

## 2024-03-14 DIAGNOSIS — G4700 Insomnia, unspecified: Secondary | ICD-10-CM | POA: Diagnosis present

## 2024-03-14 DIAGNOSIS — Z888 Allergy status to other drugs, medicaments and biological substances status: Secondary | ICD-10-CM

## 2024-03-14 DIAGNOSIS — Z9151 Personal history of suicidal behavior: Secondary | ICD-10-CM

## 2024-03-14 DIAGNOSIS — G40909 Epilepsy, unspecified, not intractable, without status epilepticus: Secondary | ICD-10-CM | POA: Diagnosis present

## 2024-03-14 DIAGNOSIS — Z885 Allergy status to narcotic agent status: Secondary | ICD-10-CM

## 2024-03-14 DIAGNOSIS — E559 Vitamin D deficiency, unspecified: Secondary | ICD-10-CM | POA: Diagnosis present

## 2024-03-14 DIAGNOSIS — B1920 Unspecified viral hepatitis C without hepatic coma: Secondary | ICD-10-CM | POA: Diagnosis present

## 2024-03-14 DIAGNOSIS — E66811 Obesity, class 1: Secondary | ICD-10-CM | POA: Diagnosis present

## 2024-03-14 DIAGNOSIS — Z66 Do not resuscitate: Secondary | ICD-10-CM | POA: Diagnosis present

## 2024-03-14 DIAGNOSIS — F319 Bipolar disorder, unspecified: Secondary | ICD-10-CM | POA: Diagnosis present

## 2024-03-14 HISTORY — DX: Cerebral infarction, unspecified: I63.9

## 2024-03-14 LAB — VITAMIN D 25 HYDROXY (VIT D DEFICIENCY, FRACTURES): Vit D, 25-Hydroxy: 28.88 ng/mL — ABNORMAL LOW (ref 30–100)

## 2024-03-14 LAB — RAPID URINE DRUG SCREEN, HOSP PERFORMED
Amphetamines: NOT DETECTED
Barbiturates: NOT DETECTED
Benzodiazepines: NOT DETECTED
Cocaine: NOT DETECTED
Opiates: NOT DETECTED
Tetrahydrocannabinol: NOT DETECTED

## 2024-03-14 LAB — URINALYSIS, ROUTINE W REFLEX MICROSCOPIC
Bilirubin Urine: NEGATIVE
Glucose, UA: NEGATIVE mg/dL
Ketones, ur: NEGATIVE mg/dL
Leukocytes,Ua: NEGATIVE
Nitrite: NEGATIVE
Protein, ur: NEGATIVE mg/dL
RBC / HPF: >50 RBC/hpf (ref 0–5)
Specific Gravity, Urine: 1.006 (ref 1.005–1.030)
pH: 5 (ref 5.0–8.0)

## 2024-03-14 LAB — COMPREHENSIVE METABOLIC PANEL WITH GFR
ALT: 16 U/L (ref 0–44)
AST: 37 U/L (ref 15–41)
Albumin: 3.9 g/dL (ref 3.5–5.0)
Alkaline Phosphatase: 84 U/L (ref 38–126)
Anion gap: 12 (ref 5–15)
BUN: 23 mg/dL (ref 8–23)
CO2: 21 mmol/L — ABNORMAL LOW (ref 22–32)
Calcium: 9.3 mg/dL (ref 8.9–10.3)
Chloride: 104 mmol/L (ref 98–111)
Creatinine, Ser: 1.4 mg/dL — ABNORMAL HIGH (ref 0.61–1.24)
GFR, Estimated: 52 mL/min — ABNORMAL LOW (ref >60)
Glucose, Bld: 109 mg/dL — ABNORMAL HIGH (ref 70–99)
Potassium: 4.3 mmol/L (ref 3.5–5.1)
Sodium: 137 mmol/L (ref 135–145)
Total Bilirubin: 0.9 mg/dL (ref 0.0–1.2)
Total Protein: 7.3 g/dL (ref 6.5–8.1)

## 2024-03-14 LAB — HEMOGLOBIN A1C
Hgb A1c MFr Bld: 5.3 % (ref 4.8–5.6)
Mean Plasma Glucose: 105.41 mg/dL

## 2024-03-14 LAB — CBC
HCT: 42.7 % (ref 39.0–52.0)
Hemoglobin: 13.8 g/dL (ref 13.0–17.0)
MCH: 31.2 pg (ref 26.0–34.0)
MCHC: 32.3 g/dL (ref 30.0–36.0)
MCV: 96.4 fL (ref 80.0–100.0)
Platelets: 170 K/uL (ref 150–400)
RBC: 4.43 MIL/uL (ref 4.22–5.81)
RDW: 13.2 % (ref 11.5–15.5)
WBC: 15 K/uL — ABNORMAL HIGH (ref 4.0–10.5)
nRBC: 0 % (ref 0.0–0.2)

## 2024-03-14 LAB — GLUCOSE, CAPILLARY
Glucose-Capillary: 80 mg/dL (ref 70–99)
Glucose-Capillary: 78 mg/dL (ref 70–99)

## 2024-03-14 LAB — ETHANOL: Alcohol, Ethyl (B): <15 mg/dL (ref <15)

## 2024-03-14 LAB — VITAMIN B12: Vitamin B-12: 311 pg/mL (ref 180–914)

## 2024-03-14 LAB — TSH: TSH: 2.965 u[IU]/mL (ref 0.350–4.500)

## 2024-03-14 LAB — AMMONIA: Ammonia: 17 umol/L (ref 9–35)

## 2024-03-14 LAB — CBG MONITORING, ED: Glucose-Capillary: 128 mg/dL — ABNORMAL HIGH (ref 70–99)

## 2024-03-14 LAB — CK: Total CK: 1140 U/L — ABNORMAL HIGH (ref 49–397)

## 2024-03-14 MED ORDER — SODIUM CHLORIDE 0.9 % IV SOLN: 250.0000 mL | INTRAVENOUS | Status: AC | PRN

## 2024-03-14 MED ORDER — ONDANSETRON HCL 4 MG PO TABS: 4.0000 mg | ORAL_TABLET | Freq: Four times a day (QID) | ORAL | Status: DC | PRN

## 2024-03-14 MED ORDER — ENOXAPARIN SODIUM 40 MG/0.4ML IJ SOSY
40.0000 mg | PREFILLED_SYRINGE | INTRAMUSCULAR | Status: DC
  Administered 2024-03-14 – 2024-03-16 (×3): 40 mg via SUBCUTANEOUS
  Filled 2024-03-14 (×4): qty 0.4

## 2024-03-14 MED ORDER — INSULIN ASPART 100 UNIT/ML IJ SOLN
0.0000 [IU] | Freq: Three times a day (TID) | INTRAMUSCULAR | Status: DC
  Administered 2024-03-23: 1 [IU] via SUBCUTANEOUS

## 2024-03-14 MED ORDER — SODIUM CHLORIDE 0.9 % IV BOLUS
500.0000 mL | Freq: Once | INTRAVENOUS | Status: AC
  Administered 2024-03-14: 500 mL via INTRAVENOUS

## 2024-03-14 MED ORDER — CIPROFLOXACIN HCL 0.3 % OP SOLN
2.0000 [drp] | OPHTHALMIC | Status: DC
  Administered 2024-03-14 – 2024-03-23 (×34): 2 [drp] via OPHTHALMIC
  Filled 2024-03-14 (×2): qty 2.5

## 2024-03-14 MED ORDER — SODIUM CHLORIDE 0.9 % IV SOLN: INTRAVENOUS | Status: AC

## 2024-03-14 MED ORDER — ONDANSETRON HCL 4 MG/2ML IJ SOLN
4.0000 mg | Freq: Four times a day (QID) | INTRAMUSCULAR | Status: DC | PRN
Start: 2024-03-14 — End: 2024-03-23

## 2024-03-14 MED ORDER — SODIUM CHLORIDE 0.9% FLUSH: 3.0000 mL | INTRAVENOUS | Status: DC | PRN

## 2024-03-14 MED ORDER — HYDRALAZINE HCL 20 MG/ML IJ SOLN
5.0000 mg | Freq: Four times a day (QID) | INTRAMUSCULAR | Status: DC | PRN
Start: 2024-03-14 — End: 2024-03-23
  Administered 2024-03-15: 5 mg via INTRAVENOUS
  Filled 2024-03-14: qty 1

## 2024-03-14 MED ORDER — SODIUM CHLORIDE 0.9% FLUSH
3.0000 mL | Freq: Two times a day (BID) | INTRAVENOUS | Status: DC
  Administered 2024-03-14 – 2024-03-20 (×11): 3 mL via INTRAVENOUS

## 2024-03-14 MED ORDER — POLYETHYLENE GLYCOL 3350 17 G PO PACK
17.0000 g | PACK | Freq: Every day | ORAL | Status: DC | PRN
  Administered 2024-03-21: 17 g via ORAL
  Filled 2024-03-14: qty 1

## 2024-03-14 MED ORDER — DIAZEPAM 5 MG/ML IJ SOLN
5.0000 mg | Freq: Once | INTRAMUSCULAR | Status: AC
  Administered 2024-03-14: 5 mg via INTRAVENOUS
  Filled 2024-03-14: qty 2

## 2024-03-14 NOTE — H&P (Addendum)
 [facility] History and Physical  Terry Baxter FMW:[id number] DOB: [birth year] DOA: 03/14/2024  Referring physician: ED  PCP: Andria Pipe, MD   Patient is coming from: Home  Chief Complaint: Altered mental status  HPI: Patient is a 78 years old male with documented history of bipolar disorder, chronic back pain, hepatitis C and history of heroin abuse, history of TIA, hyperlipidemia, hypertension and seizure disorder with type 2 diabetes presented to hospital with complaints of altered mental status, not acting usual with generalized weakness.  Family member also found him on the floor and patient stated that he might have hurt his ankle. Patient denies any headache, nausea, vomiting, diarrhea. .  Denied head trauma. No mention of urinary urgency, frequency or dysuria.  Patient slightly confused about where he is and what was going on. Patient lives by himself in an apartment.  Spoke with the patient's brother on the phone, his brother lives across his brother.  In the ED, patient was afebrile, initial blood pressure was slightly elevated.  Labs were notable for WBC elevated at 15.0.  Chemistry showed creatinine elevation at 1.4 total CK at 1140.  Urinalysis was sent for RBC but no white cells.  Urine culture was sent.  Urine drug screen was negative.  CT head scan done in the ED showed no acute findings.  Chest x-ray without any infiltrate.  Patient was then considered for observation in the hospital.   Assessment and Plan Principal Problem:   Altered mental status Active Problems:   CKD (chronic kidney disease), stage II   Hypertension   History of hepatitis C   Altered mental status.  Likely multifactorial from volume depletion, on narcotics as outpatient, history of bipolar, question seizures.. Continue with IV hydration.  Appears to be generalized process.  As per the patient's brother patient is alert awake and Communicative and oriented at baseline.  Most consistent with  history oriented at the time of my interview.  Has a history of seizure disorder in the past medical history.  Uncertain whether he had a seizure and fall.  Will get the EEG, fall precautions, seizure precautions.  Obtain MRI of the brain to rule out other cranial processes.  Right now he is fidgety.  Will give antianxiety sedation for MRI.  Ammonia level was 17.  Fall, found on the ground, generalized weakness.   Mild rhabdomyolysis.    CK level 1140.  Will get PT OT evaluation.  Will check a vitamin B12, B1, TSH.  Already on thiamine . Will hydrate with IV fluids today.   Volume depletion.  Will continue with IV hydration.  Check volume status in AM.  History of chronic kidney disease stage II.  Creatinine at 1.4.  Appears to be mildly volume depleted.  Will continue with IV hydration.  Check BMP in a.m.  History of hypertension.  Initial blood pressure was elevated.  Subsequently improved.  Will continue to monitor.  History of anxiety, bipolar disorder.  On Wellbutrin  and Klonopin , risperidone   History of seizure disorder.  Will put patient on seizure precautions.  Continue Keppra .  Check Keppra  levels.  Chronic back pain.  On oxycodone .  Will hold off for now.  Diabetes mellitus type 2.  Will get hemoglobin A1c.  continue sliding scale insulin , diabetic diet.  Bilateral conjunctival injection we will put the patient on ciprofloxacin  eye drops.  Possibility of conjunctivitis  DVT Prophylaxis: Lovenox  subcu  Review of Systems:  Limited information was obtained from the patient's brother and patient at bedside due  to altered mental status.   Past Medical History:  Diagnosis Date   Anxiety    Arthritis    Bipolar 1 disorder (HCC)    followed by dr headen every 2 months   Chronic low back pain    goes to pain center   CKD (chronic kidney disease), stage II    DJD (degenerative joint disease)    lower back   Hepatitis    History of balanitis    History of hepatitis C    per  pt treated w/ medication and cured  in 2014   History of heroin abuse (HCC)    per pt stopped and on was methadone until 10-19-2012--  none since   History of transient ischemic attack (TIA)    11-06-2014  ?tia--  MRA/ MRI normal   Hyperlipidemia    Hypertension    Nocturia    Phimosis    Seizure disorder [facility])    neurologist-  dr ary cummins--  last seizure 2014   Seizures (HCC)    once in 2010 none since   Type 2 diabetes, diet controlled (HCC)    diet controlled   Urinary incontinence    wear depends   Wears dentures    upper   Wears glasses    Past Surgical History:  Procedure Laterality Date   BACK SURGERY  1980's   lower back   CIRCUMCISION N/A 04/25/2016   Procedure: CIRCUMCISION ADULT;  Surgeon: Ricardo Likens, MD;  Location: [facility];  Service: Urology;  Laterality: N/A;   COLONOSCOPY  2013   EYE SURGERY Bilateral 09/2016   ioc lens implant for cataracts   LAPAROSCOPIC GASTRIC SLEEVE RESECTION N/A 01/06/2017   Procedure: LAPAROSCOPIC GASTRIC SLEEVE RESECTION WITH UPPER ENDO,HIATAL HERNIA REPAIR;  Surgeon: Tanda Locus, MD;  Location: [facility];  Service: General;  Laterality: N/A;   NEGATIVE SLEEP STUDY  2009  per pt   surgeru for infection of sternum  1990   TRANSTHORACIC ECHOCARDIOGRAM  11/06/2014   ef 45-50%/  trivial MR and TR    Social History:  reports that he has never smoked. He has never used smokeless tobacco. He reports that he does not drink alcohol  and does not use drugs.  Allergies  Allergen Reactions   Tizanidine Other (See Comments)    dizziness   Vicodin [Hydrocodone -Acetaminophen ] Other (See Comments)    Can't take due to bariatric surgery    History reviewed. No pertinent family history.   Prior to Admission medications   Medication Sig Start Date End Date Taking? Authorizing Provider  amoxicillin  (AMOXIL ) 500 MG capsule Take 1 capsule (500 mg total) by mouth 3 (three) times daily. 02/01/17   Sheikh, Omair Latif, DO   bacitracin  ointment Apply topically 2 (two) times daily. 02/01/17   Sherrill Cable Latif, DO  buPROPion  (WELLBUTRIN  XL) 300 MG 24 hr tablet Take 300 mg by mouth daily with breakfast. 06/02/16   [provider]  clonazePAM  (KLONOPIN ) 0.5 MG tablet Take 1 tablet (0.5 mg total) by mouth 3 (three) times daily as needed for anxiety. 01/09/17   Tanda Locus, MD  folic acid  (FOLVITE ) 1 MG tablet Take 1 tablet (1 mg total) by mouth daily. 02/02/17   Sherrill Cable Latif, DO  gabapentin  (NEURONTIN ) 300 MG capsule Take 300 mg by mouth 2 (two) times daily. 12/05/16   [provider]  levETIRAcetam  (KEPPRA ) 500 MG tablet Take 500 mg by mouth 2 (two) times daily.    [provider]  lidocaine  (  LIDODERM ) 5 % Place 1 patch onto the skin daily. Remove & Discard patch within 12 hours or as directed by MD 02/01/17   Sheikh, Omair Latif, DO  loratadine  (CLARITIN ) 10 MG tablet Take 1 tablet (10 mg total) by mouth daily. 02/23/24   Zammit, Joseph, MD  Multiple Vitamin (MULTIVITAMIN WITH MINERALS) TABS tablet Take 1 tablet by mouth daily.    [provider]  oxyCODONE -acetaminophen  (ROXICET) 5-325 MG/5ML solution Take 5 mLs by mouth every 6 (six) hours as needed. Take (5 mg) for mild pain and 10 mls (10mg ) for severe pain.     [provider]  polyethylene glycol (MIRALAX  / GLYCOLAX ) packet Take 17 g by mouth daily as needed for mild constipation.    [provider]  Propylene Glycol (SYSTANE BALANCE) 0.6 % SOLN Place 1 drop into both eyes 3 (three) times daily.    [provider]  risperiDONE  (RISPERDAL ) 1 MG tablet Take 1 mg by mouth 3 (three) times daily.  02/06/16   [provider]  senna-docusate (SENOKOT-S) 8.6-50 MG tablet Take 1 tablet by mouth 2 (two) times daily. 02/01/17   Sheikh, Omair Latif, DO  thiamine  100 MG tablet Take 1 tablet (100 mg total) by mouth daily. 02/02/17   Sherrill Cable [place], DO    Physical Exam:  Vitals:   03/14/24 1154  03/14/24 1300 03/14/24 1415 03/14/24 1445  BP: (!) 161/89 (!) 130/104 139/76 94/75  Pulse: 92 80 71 78  Resp: 16 15    Temp: 98.8 F (37.1 C)     TempSrc: Oral     SpO2: 97% 100% 100% 99%   Wt Readings from Last 3 Encounters:  02/23/24 87.5 kg  12/29/23 89 kg  01/31/17 116.1 kg   There is no height or weight on file to calculate BMI.  General:  Average built, not in obvious distress, Communicative, disoriented, elderly male, HENT: Normocephalic, No scleral pallor or icterus noted. Oral mucosa is dry with dry cracked lips.  Bilateral pink eyes with discharge and crusting Chest:  Clear breath sounds.  . No crackles or wheezes.  CVS: S1 &S2 heard. No murmur.  Regular rate and rhythm. Abdomen: Soft, nontender, nondistended.  Bowel sounds are heard. No abdominal mass palpated Extremities: No cyanosis, clubbing or edema.  Peripheral pulses are palpable. Psych: Alert, awake and Communicative, disoriented, anxious, CNS:  No cranial nerve deficits.  Power equal in all extremities.   Skin: Warm and dry.  No rashes noted.  Labs on Admission:   CBC: Recent Labs  Lab 03/14/24 1205  WBC 15.0*  HGB 13.8  HCT 42.7  MCV 96.4  PLT 170    Basic Metabolic Panel: Recent Labs  Lab 03/14/24 1205  NA 137  K 4.3  CL 104  CO2 21*  GLUCOSE 109*  BUN 23  CREATININE 1.40*  CALCIUM  9.3    Liver Function Tests: Recent Labs  Lab 03/14/24 1205  AST 37  ALT 16  ALKPHOS 84  BILITOT 0.9  PROT 7.3  ALBUMIN 3.9   No results for input(s): LIPASE, AMYLASE in the last 168 hours. Recent Labs  Lab 03/14/24 1320  AMMONIA 17    Cardiac Enzymes: Recent Labs  Lab 03/14/24 1214  CKTOTAL 1,140*    BNP (last 3 results) No results for input(s): BNP in the last 8760 hours.  ProBNP (last 3 results) No results for input(s): PROBNP in the last 8760 hours.  CBG: Recent Labs  Lab 03/14/24 1158  GLUCAP 128*  Lipase     Component Value Date/Time   LIPASE 23 11/24/2011  1907     Urinalysis    Component Value Date/Time   COLORURINE STRAW (A) 03/14/2024 1157   APPEARANCEUR CLEAR 03/14/2024 1157   LABSPEC 1.006 03/14/2024 1157   PHURINE 5.0 03/14/2024 1157   GLUCOSEU NEGATIVE 03/14/2024 1157   HGBUR LARGE (A) 03/14/2024 1157   BILIRUBINUR NEGATIVE 03/14/2024 1157   KETONESUR NEGATIVE 03/14/2024 1157   PROTEINUR NEGATIVE 03/14/2024 1157   UROBILINOGEN 1.0 11/07/2014 0511   NITRITE NEGATIVE 03/14/2024 1157   LEUKOCYTESUR NEGATIVE 03/14/2024 1157     Drugs of Abuse     Component Value Date/Time   LABOPIA NONE DETECTED 03/14/2024 1411   COCAINSCRNUR NONE DETECTED 03/14/2024 1411   LABBENZ NONE DETECTED 03/14/2024 1411   AMPHETMU NONE DETECTED 03/14/2024 1411   THCU NONE DETECTED 03/14/2024 1411   LABBARB NONE DETECTED 03/14/2024 1411      Radiological Exams on Admission: DG Chest Portable 1 View Result Date: 03/14/2024 CLINICAL DATA:  Altered mental status. EXAM: PORTABLE CHEST 1 VIEW COMPARISON:  02/23/2024. FINDINGS: Bilateral lung fields are clear. Bilateral costophrenic angles are clear. Normal cardio-mediastinal silhouette. No acute osseous abnormalities. The soft tissues are within normal limits. IMPRESSION: No active disease. Electronically Signed   By: Ree Molt M.D.   On: 03/14/2024 13:37   CT Head Wo Contrast Result Date: 03/14/2024 CLINICAL DATA:  AMS EXAM: CT HEAD WITHOUT CONTRAST TECHNIQUE: Contiguous axial images were obtained from the base of the skull through the vertex without intravenous contrast. RADIATION DOSE REDUCTION: This exam was performed according to the departmental dose-optimization program which includes automated exposure control, adjustment of the mA and/or kV according to patient size and/or use of iterative reconstruction technique. COMPARISON:  January 29, 2017 FINDINGS: Brain: The ventricles appear age appropriate. No mass effect or midline shift. Gray-white differentiation is preserved.Periventricular and  subcortical white matter hypoattenuation, most consistent with changes of moderate chronic ischemic microvascular disease.No evidence of acute territorial infarction, extra-axial fluid collection, hemorrhage, or mass lesion. 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 The basilar cisterns are patent without downward herniation. The cerebellar hemispheres and vermis are well formed without mass lesion or focal attenuation abnormality. Vascular: No hyperdense vessel. Skull: Normal. Negative for fracture or focal lesion. Sinuses/Orbits: The paranasal sinuses and mastoids are clear.The globes appear intact. No retrobulbar hematoma. Other: None. IMPRESSION: 1. No acute intracranial abnormality, specifically, no acute hemorrhage, territorial infarction, or intracranial mass. 2. Sequelae of chronic ischemic microvascular disease. Electronically Signed   By: Rogelia Myers M.D.   On: 03/14/2024 13:22    EKG: Personally reviewed by me which shows normal sinus rhythm   Consultant: None  Code Status: Full code  Microbiology urine culture  Antibiotics: None  Family Communication:  Patients' condition and plan of care including tests being ordered have been discussed with the patient and the patient's brother who indicate understanding and agree with the plan.   Status is: Observation The patient remains OBS appropriate and will d/c before 2 midnights.   Severity of Illness: The appropriate patient status for this patient is OBSERVATION. Observation status is judged to be reasonable and necessary in order to provide the required intensity of service to ensure the patient's safety. The patient's presenting symptoms, physical exam findings, and initial radiographic and laboratory data in the context of their medical condition is felt to place them at decreased risk for further clinical deterioration. Furthermore, it is anticipated that the patient will be  medically stable for discharge  from the hospital within 2 midnights of admission.   Signed, Vernal Alstrom, MD [facility] 03/14/2024

## 2024-03-14 NOTE — ED Notes (Signed)
 Unsuccessful attempt at in/out cath with MD at bedside.

## 2024-03-14 NOTE — ED Notes (Signed)
 Urinary occurrence in CT.

## 2024-03-14 NOTE — Progress Notes (Signed)
 Pt went down for MRI

## 2024-03-14 NOTE — ED Notes (Signed)
 Ammonia lab pulled and sent to lab.

## 2024-03-14 NOTE — ED Provider Notes (Signed)
 [place] EMERGENCY DEPARTMENT AT [facility] Provider Note   CSN: [id number] Arrival date & time: 03/14/24  1137     Patient presents with: Altered Mental Status   Terry Baxter is a 78 y.o. male.   Patient presents from home with concern for altered mental status for the last 2 days and general weakness.  Family member found patient on the ground however patient denies falling.  Patient denies complaints however mild confusion.  No focal deficits appreciated.  Patient denies recent drug or alcohol  use.  Patient unsure if he hit his head.    The history is provided by the patient and the EMS personnel.  Altered Mental Status Presenting symptoms: disorientation        Prior to Admission medications   Medication Sig Start Date End Date Taking? Authorizing Provider  amoxicillin  (AMOXIL ) 500 MG capsule Take 1 capsule (500 mg total) by mouth 3 (three) times daily. 02/01/17   Sheikh, Omair Latif, DO  bacitracin  ointment Apply topically 2 (two) times daily. 02/01/17   Sheikh, Omair Latif, DO  buPROPion  (WELLBUTRIN  XL) 300 MG 24 hr tablet Take 300 mg by mouth daily with breakfast. 06/02/16   [provider]  clonazePAM  (KLONOPIN ) 0.5 MG tablet Take 1 tablet (0.5 mg total) by mouth 3 (three) times daily as needed for anxiety. 01/09/17   Tanda Locus, MD  folic acid  (FOLVITE ) 1 MG tablet Take 1 tablet (1 mg total) by mouth daily. 02/02/17   Sherrill Cable Latif, DO  gabapentin  (NEURONTIN ) 300 MG capsule Take 300 mg by mouth 2 (two) times daily. 12/05/16   [provider]  levETIRAcetam  (KEPPRA ) 500 MG tablet Take 500 mg by mouth 2 (two) times daily.    [provider]  lidocaine  (LIDODERM ) 5 % Place 1 patch onto the skin daily. Remove & Discard patch within 12 hours or as directed by MD 02/01/17   Sheikh, Omair Latif, DO  loratadine  (CLARITIN ) 10 MG tablet Take 1 tablet (10 mg total) by mouth daily. 02/23/24   Zammit, Joseph, MD  Multiple Vitamin (MULTIVITAMIN WITH  MINERALS) TABS tablet Take 1 tablet by mouth daily.    [provider]  oxyCODONE -acetaminophen  (ROXICET) 5-325 MG/5ML solution Take 5 mLs by mouth every 6 (six) hours as needed. Take (5 mg) for mild pain and 10 mls (10mg ) for severe pain.     [provider]  polyethylene glycol (MIRALAX  / GLYCOLAX ) packet Take 17 g by mouth daily as needed for mild constipation.    [provider]  Propylene Glycol (SYSTANE BALANCE) 0.6 % SOLN Place 1 drop into both eyes 3 (three) times daily.    [provider]  risperiDONE  (RISPERDAL ) 1 MG tablet Take 1 mg by mouth 3 (three) times daily.  02/06/16   [provider]  senna-docusate (SENOKOT-S) 8.6-50 MG tablet Take 1 tablet by mouth 2 (two) times daily. 02/01/17   Sheikh, Omair Latif, DO  thiamine  100 MG tablet Take 1 tablet (100 mg total) by mouth daily. 02/02/17   Sheikh, Omair Latif, DO    Allergies: Tizanidine and Vicodin [hydrocodone -acetaminophen ]    Review of Systems  Unable to perform ROS: Mental status change    Updated Vital Signs BP 94/75   Pulse 78   Temp 98.8 F (37.1 C) (Oral)   Resp 15   SpO2 99%   Physical Exam Vitals and nursing note reviewed.  Constitutional:      General: He is not in acute distress.  Appearance: He is well-developed.  HENT:     Head: Normocephalic and atraumatic.     Mouth/Throat:     Mouth: Mucous membranes are dry.  Eyes:     General:        Right eye: No discharge.        Left eye: No discharge.     Conjunctiva/sclera: Conjunctivae normal.  Neck:     Trachea: No tracheal deviation.  Cardiovascular:     Rate and Rhythm: Normal rate and regular rhythm.  Pulmonary:     Effort: Pulmonary effort is normal.     Breath sounds: Normal breath sounds.  Abdominal:     General: There is no distension.     Palpations: Abdomen is soft.     Tenderness: There is no abdominal tenderness. There is no guarding.  Musculoskeletal:        General: No swelling. Normal  range of motion.     Cervical back: Normal range of motion and neck supple. No rigidity.  Skin:    General: Skin is warm.     Capillary Refill: Capillary refill takes 2 to 3 seconds.     Findings: No rash.  Neurological:     General: No focal deficit present.     Mental Status: He is alert.     Comments: Patient moves all extremities equal bilateral finger-nose intact, pupils equal bilateral approximately 3 mm responsive to light.  Patient have difficulty following multiple commands in a row and difficulty with following extraocular muscle function.  No meningismus.  Gross sensation intact palpation bilateral.  General weakness on exam no focality in 1 extremity.     (all labs ordered are listed, but only abnormal results are displayed) Labs Reviewed  COMPREHENSIVE METABOLIC PANEL WITH GFR - Abnormal; Notable for the following components:      Result Value   CO2 21 (*)    Glucose, Bld 109 (*)    Creatinine, Ser 1.40 (*)    GFR, Estimated 52 (*)    All other components within normal limits  CBC - Abnormal; Notable for the following components:   WBC 15.0 (*)    All other components within normal limits  URINALYSIS, ROUTINE W REFLEX MICROSCOPIC - Abnormal; Notable for the following components:   Color, Urine STRAW (*)    Hgb urine dipstick LARGE (*)    Bacteria, UA RARE (*)    All other components within normal limits  CK - Abnormal; Notable for the following components:   Total CK 1,140 (*)    All other components within normal limits  CBG MONITORING, ED - Abnormal; Notable for the following components:   Glucose-Capillary 128 (*)    All other components within normal limits  URINE CULTURE  AMMONIA  RAPID URINE DRUG SCREEN, HOSP PERFORMED  ETHANOL    EKG: None  Radiology: DG Chest Portable 1 View Result Date: 03/14/2024 CLINICAL DATA:  Altered mental status. EXAM: PORTABLE CHEST 1 VIEW COMPARISON:  02/23/2024. FINDINGS: Bilateral lung fields are clear. Bilateral  costophrenic angles are clear. Normal cardio-mediastinal silhouette. No acute osseous abnormalities. The soft tissues are within normal limits. IMPRESSION: No active disease. Electronically Signed   By: Ree Molt M.D.   On: 03/14/2024 13:37   CT Head Wo Contrast Result Date: 03/14/2024 CLINICAL DATA:  AMS EXAM: CT HEAD WITHOUT CONTRAST TECHNIQUE: Contiguous axial images were obtained from the base of the skull through the vertex without intravenous contrast. RADIATION DOSE REDUCTION: This exam was performed according to the  departmental dose-optimization program which includes automated exposure control, adjustment of the mA and/or kV according to patient size and/or use of iterative reconstruction technique. COMPARISON:  January 29, 2017 FINDINGS: Brain: The ventricles appear age appropriate. No mass effect or midline shift. Gray-white differentiation is preserved.Periventricular and subcortical white matter hypoattenuation, most consistent with changes of moderate chronic ischemic microvascular disease.No evidence of acute territorial infarction, extra-axial fluid collection, hemorrhage, or mass lesion. 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 The basilar cisterns are patent without downward herniation. The cerebellar hemispheres and vermis are well formed without mass lesion or focal attenuation abnormality. Vascular: No hyperdense vessel. Skull: Normal. Negative for fracture or focal lesion. Sinuses/Orbits: The paranasal sinuses and mastoids are clear.The globes appear intact. No retrobulbar hematoma. Other: None. IMPRESSION: 1. No acute intracranial abnormality, specifically, no acute hemorrhage, territorial infarction, or intracranial mass. 2. Sequelae of chronic ischemic microvascular disease. Electronically Signed   By: Rogelia Myers M.D.   On: 03/14/2024 13:22     Procedures   Medications Ordered in the ED  sodium chloride  0.9 % bolus 500 mL (0 mLs Intravenous Stopped  03/14/24 1443)                                    Medical Decision Making Amount and/or Complexity of Data Reviewed Labs: ordered. Radiology: ordered.   Patient presents with disorientation last known normal approximately 2 days ago.  No family in the room.  Trying to obtain contact phone number to speak with family for further details.  EMS provided initial details and report.  Patient has no focality no obvious signs of significant stroke however does have mild disorientation was not sure if he was in [place] or [place].  Patient does say he lives with his sister who is younger.  Broad differential includes metabolic hypoglycemia, anemia, seizure, ammonia elevation, subdural, UTI, occult stroke, other.  CT scan of the head independently reviewed no acute findings.  Blood work added on ethanol pneumonia.  Blood work independent reviewed CK mild elevated 1100, creatinine 1.4, white blood cell count 15,000.  Second attempt able to obtain urinalysis pending.  Attempted to call patient's son Terry Baxter at [phone] left message to return call.  Challenge at this time is patient's baseline and specific details of the past few days unknown.  - Urinalysis independent reviewed no signs infection small amount of blood.  Plan for admission for further evaluation of disorientation.  Hospitalist paged.     Final diagnoses:  Confusion and disorientation    ED Discharge Orders     None          Tonia Chew, MD 03/14/24 1550

## 2024-03-14 NOTE — ED Notes (Signed)
 Fall precautions in place, including bed alarm.

## 2024-03-14 NOTE — Progress Notes (Signed)
 Pt brought up from ED,pt is alert to person and place but has bouts of confusion, complete bed change, bed in lowest locked position, vs stable.

## 2024-03-14 NOTE — ED Notes (Signed)
 Patient transported to CT

## 2024-03-14 NOTE — ED Triage Notes (Signed)
 Pt bib [facility] coming from home with CC of AMS x2days and generalized weakness x3 months. Pt's brother reportedly found patient on ground but patient states he did not fall. Pt has no complaints at this time, denies pain. Pt is normally A&Ox4 at baseline but is alert and oriented to person and location during triage. VSS with EMS.   EMS: 136 cbg 126/75 88 HR 96% RA 12 lead unremarkable

## 2024-03-15 DIAGNOSIS — Z7189 Other specified counseling: Secondary | ICD-10-CM | POA: Diagnosis not present

## 2024-03-15 DIAGNOSIS — F319 Bipolar disorder, unspecified: Secondary | ICD-10-CM | POA: Diagnosis not present

## 2024-03-15 DIAGNOSIS — G4700 Insomnia, unspecified: Secondary | ICD-10-CM | POA: Diagnosis not present

## 2024-03-15 DIAGNOSIS — E860 Dehydration: Secondary | ICD-10-CM | POA: Diagnosis not present

## 2024-03-15 DIAGNOSIS — Y92009 Unspecified place in unspecified non-institutional (private) residence as the place of occurrence of the external cause: Secondary | ICD-10-CM | POA: Diagnosis not present

## 2024-03-15 DIAGNOSIS — Z885 Allergy status to narcotic agent status: Secondary | ICD-10-CM | POA: Diagnosis not present

## 2024-03-15 DIAGNOSIS — B1920 Unspecified viral hepatitis C without hepatic coma: Secondary | ICD-10-CM | POA: Diagnosis not present

## 2024-03-15 DIAGNOSIS — G9341 Metabolic encephalopathy: Secondary | ICD-10-CM | POA: Diagnosis not present

## 2024-03-15 DIAGNOSIS — E8721 Acute metabolic acidosis: Secondary | ICD-10-CM | POA: Diagnosis not present

## 2024-03-15 DIAGNOSIS — Z515 Encounter for palliative care: Secondary | ICD-10-CM | POA: Diagnosis not present

## 2024-03-15 DIAGNOSIS — E559 Vitamin D deficiency, unspecified: Secondary | ICD-10-CM | POA: Diagnosis not present

## 2024-03-15 DIAGNOSIS — R4182 Altered mental status, unspecified: Secondary | ICD-10-CM | POA: Diagnosis not present

## 2024-03-15 DIAGNOSIS — T796XXA Traumatic ischemia of muscle, initial encounter: Secondary | ICD-10-CM | POA: Diagnosis not present

## 2024-03-15 DIAGNOSIS — F4322 Adjustment disorder with anxiety: Secondary | ICD-10-CM | POA: Diagnosis not present

## 2024-03-15 DIAGNOSIS — R45851 Suicidal ideations: Secondary | ICD-10-CM | POA: Diagnosis not present

## 2024-03-15 DIAGNOSIS — R531 Weakness: Secondary | ICD-10-CM | POA: Diagnosis not present

## 2024-03-15 DIAGNOSIS — G40909 Epilepsy, unspecified, not intractable, without status epilepticus: Secondary | ICD-10-CM | POA: Diagnosis not present

## 2024-03-15 DIAGNOSIS — Z66 Do not resuscitate: Secondary | ICD-10-CM | POA: Diagnosis not present

## 2024-03-15 DIAGNOSIS — N1831 Chronic kidney disease, stage 3a: Secondary | ICD-10-CM | POA: Diagnosis not present

## 2024-03-15 DIAGNOSIS — E785 Hyperlipidemia, unspecified: Secondary | ICD-10-CM | POA: Diagnosis not present

## 2024-03-15 DIAGNOSIS — F1120 Opioid dependence, uncomplicated: Secondary | ICD-10-CM | POA: Diagnosis not present

## 2024-03-15 DIAGNOSIS — I129 Hypertensive chronic kidney disease with stage 1 through stage 4 chronic kidney disease, or unspecified chronic kidney disease: Secondary | ICD-10-CM | POA: Diagnosis not present

## 2024-03-15 DIAGNOSIS — Z886 Allergy status to analgesic agent status: Secondary | ICD-10-CM | POA: Diagnosis not present

## 2024-03-15 DIAGNOSIS — E1122 Type 2 diabetes mellitus with diabetic chronic kidney disease: Secondary | ICD-10-CM | POA: Diagnosis not present

## 2024-03-15 DIAGNOSIS — Z6830 Body mass index (BMI) 30.0-30.9, adult: Secondary | ICD-10-CM | POA: Diagnosis not present

## 2024-03-15 DIAGNOSIS — R569 Unspecified convulsions: Secondary | ICD-10-CM | POA: Diagnosis not present

## 2024-03-15 DIAGNOSIS — W19XXXA Unspecified fall, initial encounter: Secondary | ICD-10-CM | POA: Diagnosis not present

## 2024-03-15 DIAGNOSIS — Z79899 Other long term (current) drug therapy: Secondary | ICD-10-CM | POA: Diagnosis not present

## 2024-03-15 DIAGNOSIS — Z888 Allergy status to other drugs, medicaments and biological substances status: Secondary | ICD-10-CM | POA: Diagnosis not present

## 2024-03-15 DIAGNOSIS — E66811 Obesity, class 1: Secondary | ICD-10-CM | POA: Diagnosis present

## 2024-03-15 DIAGNOSIS — G8929 Other chronic pain: Secondary | ICD-10-CM | POA: Diagnosis not present

## 2024-03-15 LAB — GLUCOSE, CAPILLARY
Glucose-Capillary: 57 mg/dL — ABNORMAL LOW (ref 70–99)
Glucose-Capillary: 90 mg/dL (ref 70–99)
Glucose-Capillary: 62 mg/dL — ABNORMAL LOW (ref 70–99)
Glucose-Capillary: 113 mg/dL — ABNORMAL HIGH (ref 70–99)
Glucose-Capillary: 158 mg/dL — ABNORMAL HIGH (ref 70–99)
Glucose-Capillary: 93 mg/dL (ref 70–99)

## 2024-03-15 LAB — URINE CULTURE: Culture: NO GROWTH

## 2024-03-15 LAB — CBC
HCT: 38.6 % — ABNORMAL LOW (ref 39.0–52.0)
Hemoglobin: 13.1 g/dL (ref 13.0–17.0)
MCH: 31.2 pg (ref 26.0–34.0)
MCHC: 33.9 g/dL (ref 30.0–36.0)
MCV: 91.9 fL (ref 80.0–100.0)
Platelets: 170 K/uL (ref 150–400)
RBC: 4.2 MIL/uL — ABNORMAL LOW (ref 4.22–5.81)
RDW: 13.3 % (ref 11.5–15.5)
WBC: 10.2 K/uL (ref 4.0–10.5)
nRBC: 0 % (ref 0.0–0.2)

## 2024-03-15 LAB — LIPID PANEL
Cholesterol: 174 mg/dL (ref 0–200)
HDL: 53 mg/dL (ref >40)
LDL Cholesterol: 110 mg/dL — ABNORMAL HIGH (ref 0–99)
Total CHOL/HDL Ratio: 3.3 ratio
Triglycerides: 54 mg/dL (ref <150)
VLDL: 11 mg/dL (ref 0–40)

## 2024-03-15 LAB — PROTIME-INR
INR: 1.1 (ref 0.8–1.2)
Prothrombin Time: 15.2 s (ref 11.4–15.2)

## 2024-03-15 LAB — BASIC METABOLIC PANEL WITH GFR
Anion gap: 10 (ref 5–15)
BUN: 20 mg/dL (ref 8–23)
CO2: 23 mmol/L (ref 22–32)
Calcium: 8.8 mg/dL — ABNORMAL LOW (ref 8.9–10.3)
Chloride: 105 mmol/L (ref 98–111)
Creatinine, Ser: 1.27 mg/dL — ABNORMAL HIGH (ref 0.61–1.24)
GFR, Estimated: 59 mL/min — ABNORMAL LOW (ref >60)
Glucose, Bld: 71 mg/dL (ref 70–99)
Potassium: 3.8 mmol/L (ref 3.5–5.1)
Sodium: 138 mmol/L (ref 135–145)

## 2024-03-15 MED ORDER — DEXTROSE 50 % IV SOLN
INTRAVENOUS | Status: AC
  Administered 2024-03-15: 25 g via INTRAVENOUS
  Filled 2024-03-15: qty 50

## 2024-03-15 MED ORDER — FOLIC ACID 1 MG PO TABS
1.0000 mg | ORAL_TABLET | Freq: Every day | ORAL | Status: DC
  Administered 2024-03-15 – 2024-03-23 (×8): 1 mg via ORAL
  Filled 2024-03-15 (×9): qty 1

## 2024-03-15 MED ORDER — DEXTROSE 50 % IV SOLN
25.0000 g | INTRAVENOUS | Status: AC
Start: 2024-03-15 — End: 2024-03-15

## 2024-03-15 MED ORDER — LEVETIRACETAM 500 MG PO TABS
500.0000 mg | ORAL_TABLET | Freq: Two times a day (BID) | ORAL | Status: DC
  Administered 2024-03-15 – 2024-03-23 (×17): 500 mg via ORAL
  Filled 2024-03-15 (×17): qty 1

## 2024-03-15 MED ORDER — THIAMINE MONONITRATE 100 MG PO TABS
100.0000 mg | ORAL_TABLET | Freq: Every day | ORAL | Status: DC
Start: 2024-03-15 — End: 2024-03-23
  Administered 2024-03-15 – 2024-03-23 (×8): 100 mg via ORAL
  Filled 2024-03-15 (×12): qty 1

## 2024-03-15 NOTE — Evaluation (Signed)
 Physical Therapy Evaluation Patient Details Name: Terry Baxter MRN: [id number] DOB: Sep 19, 1945 Today's Date: 03/15/2024  History of Present Illness  Patient is a 78 y/o male admitted 03/14/24 with AMS and weakness after family found him no the floor.  CTH unremarkable and EEG pending.  Felt possibly due to volume depletion. Positive for mild rhabdomyolysis. PMH positive for seizure disorder, chronic back pain, bipolar disorder, HTN CKD II, HTN and Hep C.  Clinical Impression  Patient presents with decreased mobility due to pain in knees, decreased cognition, decreased balance, decreased strength and poor activity tolerance.  Currently mod A overall for OOB to recliner with RW.  Patient previously living alone and mobilizing with cane cleaning for himself and sister helping some with meals (lives next door).  Feel patient will benefit from skilled PT in the acute setting and may need post-acute inpatient rehab (<3 hours/day) prior to home as he lives alone.         If plan is discharge home, recommend the following: A lot of help with bathing/dressing/bathroom;A lot of help with walking and/or transfers;Supervision due to cognitive status;Assist for transportation;Assistance with cooking/housework   Can travel by private vehicle   No    Equipment Recommendations Rolling walker (2 wheels)  Recommendations for Other Services       Functional Status Assessment Patient has had a recent decline in their functional status and demonstrates the ability to make significant improvements in function in a reasonable and predictable amount of time.     Precautions / Restrictions Precautions Precautions: Fall Recall of Precautions/Restrictions: Impaired      Mobility  Bed Mobility Overal bed mobility: Needs Assistance Bed Mobility: Supine to Sit     Supine to sit: Min assist     General bed mobility comments: HHA to lift trunk, scoot hips    Transfers Overall transfer level: Needs  assistance Equipment used: Rolling walker (2 wheels) Transfers: Sit to/from Stand, Bed to chair/wheelchair/BSC Sit to Stand: From elevated surface, Mod assist   Step pivot transfers: Min assist, Mod assist       General transfer comment: some lifting help to stand, assist for safety stepping to recliner though pt sitting prior to close to middle of the chair with cues for repositioning and lowering assist    Ambulation/Gait               General Gait Details: declined ambulation and RN reporting blood sugar low and gave orange juice, cranberry juice and peanut butter while seated EOB  Stairs            Wheelchair Mobility     Tilt Bed    Modified Rankin (Stroke Patients Only)       Balance Overall balance assessment: Needs assistance   Sitting balance-Leahy Scale: Fair     Standing balance support: Bilateral upper extremity supported, Reliant on assistive device for balance Standing balance-Leahy Scale: Poor Standing balance comment: min A and UE support in standing                             Pertinent Vitals/Pain Pain Assessment Pain Assessment: Faces Faces Pain Scale: Hurts little more Pain Location: thighs/knees with mobility, no pain at rest Pain Descriptors / Indicators: Aching, Discomfort, Guarding, Grimacing Pain Intervention(s): Monitored during session, Repositioned, Limited activity within patient's tolerance    Home Living Family/patient expects to be discharged to:: Private residence Living Arrangements: Alone   Type of Home: House Home  Access: Level entry       Home Layout: One level Home Equipment: Cane - single point;Shower seat Additional Comments: reports one fall in past 6 months    Prior Function Prior Level of Function : History of Falls (last six months)             Mobility Comments: states has a friend that can help if needed ADLs Comments: cooks and cleans     Extremity/Trunk Assessment   Upper  Extremity Assessment Upper Extremity Assessment: Generalized weakness (moving restlessly or as per pt with tardive dyskinesia)    Lower Extremity Assessment Lower Extremity Assessment: RLE deficits/detail;LLE deficits/detail RLE Deficits / Details: painful with knee flexion > 40, can lift antigravity, generalized weakness noted RLE Sensation: history of peripheral neuropathy LLE Deficits / Details: painful with knee flexion > 40, can lift antigravity, generalized weakness noted LLE Sensation: history of peripheral neuropathy    Cervical / Trunk Assessment Cervical / Trunk Assessment: Kyphotic  Communication   Communication Communication: No apparent difficulties    Cognition Arousal: Alert Behavior During Therapy: Restless   PT - Cognitive impairments: No family/caregiver present to determine baseline, Problem solving, Orientation, Safety/Judgement   Orientation impairments: Place, Situation                   PT - Cognition Comments: slow processing, not oriented to place or why he is here, states due to tardive dyskinesia Following commands: Intact, Impaired Following commands impaired: Only follows one step commands consistently, Follows one step commands with increased time     Cueing Cueing Techniques: Verbal cues, Gestural cues, Tactile cues     General Comments General comments (skin integrity, edema, etc.): periwick in place    Exercises     Assessment/Plan    PT Assessment Patient needs continued PT services  PT Problem List Decreased strength;Decreased activity tolerance       PT Treatment Interventions DME instruction;Gait training;Functional mobility training;Therapeutic activities;Therapeutic exercise;Balance training;Patient/family education;Cognitive remediation    PT Goals (Current goals can be found in the Care Plan section)  Acute Rehab PT Goals Patient Stated Goal: unable to state PT Goal Formulation: Patient unable to participate in goal  setting Time For Goal Achievement: 03/29/24 Potential to Achieve Goals: Fair    Frequency Min 2X/week     Co-evaluation               AM-PAC PT 6 Clicks Mobility  Outcome Measure Help needed turning from your back to your side while in a flat bed without using bedrails?: A Little Help needed moving from lying on your back to sitting on the side of a flat bed without using bedrails?: A Little Help needed moving to and from a bed to a chair (including a wheelchair)?: A Lot Help needed standing up from a chair using your arms (e.g., wheelchair or bedside chair)?: A Lot Help needed to walk in hospital room?: Total Help needed climbing 3-5 steps with a railing? : Total 6 Click Score: 12    End of Session Equipment Utilized During Treatment: Gait belt Activity Tolerance: Patient limited by fatigue;Patient limited by pain Patient left: in chair;with call bell/phone within reach;with chair alarm set   PT Visit Diagnosis: Other abnormalities of gait and mobility (R26.89);Pain Pain - Right/Left: Left (both) Pain - part of body: Knee    Time: 1203-1230 PT Time Calculation (min) (ACUTE ONLY): 27 min   Charges:   PT Evaluation $PT Eval Moderate Complexity: 1 Mod PT Treatments $Therapeutic  Activity: 8-22 mins PT General Charges $$ ACUTE PT VISIT: 1 Visit         Micheline Portal, PT Acute Rehabilitation Services [phone] 03/15/2024   Montie Portal 03/15/2024, 12:40 PM

## 2024-03-15 NOTE — Progress Notes (Signed)
 Transition of Care [facility]) - Inpatient Brief Assessment   Patient Details  Name: Terry Baxter MRN: [id number] Date of Birth: November 13, 1945  [facility]) CM/SW Contact:    Rosaline JONELLE Joe, RN Phone Number: 03/15/2024, 11:29 AM   Clinical Narrative: Patient admitted to the hospital with acute metabolic encephalopathy and is currently receiving Iv fluids.  Patient was found on the floor at the home by a family member.  PT/OT evaluation is pending at this time.    IP Care management team will continue to follow the patient for [facility] needs pending evaluation by therapy for disposition needs.   Transition of Care Asessment: Insurance and Status: (P) Insurance coverage has been reviewed Patient has primary care physician: (P) Yes Home environment has been reviewed: (P) from home   Prior/Current Home Services: (P) No current home services Social Drivers of Health Review: (P) SDOH reviewed needs interventions Readmission risk has been reviewed: (P) Yes Transition of care needs: (P) transition of care needs identified, TOC will continue to follow

## 2024-03-15 NOTE — Progress Notes (Signed)
 Mobility Specialist: Progress Note   03/15/24 1555  Mobility  Activity Ambulated with assistance (to BR)  Level of Assistance Contact guard assist, steadying assist  Assistive Device Front wheel walker  Distance Ambulated (ft) 15 ft  Mobility visit 1 Mobility  Mobility Specialist Start Time (ACUTE ONLY) 1540  Mobility Specialist Stop Time (ACUTE ONLY) 1545  Mobility Specialist Time Calculation (min) (ACUTE ONLY) 5 min    Pt received ambulating to BR with bed alarm going off. Close SV/CGA d/t pt ambulating very unsteady and furniture surfing. At first, pt attempted to grab the trash can and void in there and required redirection to the BR and to sit on the commode. Left on the commode and RW provided at [facility] door. All needs met, NT present and aware.    Ileana Lute Mobility Specialist Please contact via SecureChat or Rehab office at [phone]

## 2024-03-15 NOTE — Plan of Care (Signed)

## 2024-03-15 NOTE — Progress Notes (Signed)
 PROGRESS NOTE    Terry Baxter  FMW:[id number] DOB: [birth year] DOA: 03/14/2024 PCP: Andria Pipe, MD   Brief Narrative:   78 years old male with documented history of bipolar disorder, chronic back pain, hepatitis C and history of heroin abuse, history of TIA, hyperlipidemia, hypertension and seizure disorder with type 2 diabetes presented to hospital with complaints of altered mental status, not acting usual with generalized weakness.  Family member also found him on the floor. CT head is unremarkable. He couldn't tolerate MRI. EEG is pending.  Assessment & Plan:  Principal Problem:   Altered mental status Active Problems:   CKD (chronic kidney disease), stage II   Hypertension   History of hepatitis C  Acute metabolic encephalopathy,POA:  Improved Likely multifactorial from volume depletion, on narcotics as outpatient Continue with IV hydration.   Uncertain whether he had a seizure and fall.   Will get the EEG, fall precautions, seizure precautions.   Patient couldn't tolerate MRI brain.  Ammonia level was 17.   Fall, found on the ground, generalized weakness/Physical deconditioning.   Mild rhabdomyolysis.    CK level 1140.  Ordered PT OT evaluation. F/u repeat CPK B12 level is normal    Volume depletion/Dehydration.  continue with IV hydration.     Chronic kidney disease stage II.  Creatinine at 1.4.  Appears to be mildly volume depleted.  Will continue with IV hydration.  Check BMP in a.m.   Hypertension.  Initial blood pressure was elevated.  Subsequently improved.  Will continue to monitor.   Anxiety, bipolar disorder.  On Wellbutrin  and Klonopin , risperidone    Seizure disorder.  Will put patient on seizure precautions.  Continue Keppra .  Check Keppra  levels.   Chronic back pain.  On oxycodone .  Will hold off for now.  Bilateral conjunctival injection we will put the patient on ciprofloxacin  eye drops.  Possibility of conjunctivitis   Disposition: HHS vs SNF,  dependign on the clinical course and PT OT evaluations.    DVT prophylaxis: enoxaparin  (LOVENOX ) injection 40 mg Start: 03/14/24 1600 SCDs Start: 03/14/24 1555     Code Status: Full Code Family Communication:   Status is: Observation The patient remains OBS appropriate and will d/c before 2 midnights.    Subjective:  No acute events overnight. He denied any active complaints. He told me that he lives at home by himself.  Examination:  General exam: Appears calm and comfortable  Respiratory system: Clear to auscultation. Respiratory effort normal. Cardiovascular system: S1 & S2 heard, RRR. No JVD, murmurs, rubs, gallops or clicks. No pedal edema. Gastrointestinal system: Abdomen is nondistended, soft and nontender. No organomegaly or masses felt. Normal bowel sounds heard. Central nervous system: Alert and oriented. No focal neurological deficits. Extremities: Symmetric 5 x 5 power. Skin: No rashes, lesions or ulcers        Diet Orders (From admission, onward)     Start     Ordered   03/14/24 1615  Diet Carb Modified Fluid consistency: Thin  Diet effective now       Question Answer Comment  Calorie Level Medium 1600-2000   Fluid consistency: Thin      03/14/24 1614            Objective: Vitals:   03/14/24 1959 03/15/24 0036 03/15/24 0533 03/15/24 0848  BP: (!) 113/90 110/82 112/62 101/61  Pulse: 72 73 68 68  Resp: 17 16 16    Temp: 98 F (36.7 C) 99.3 F (37.4 C) 98.4 F (36.9 C) 98.3 F (  36.8 C)  TempSrc: Oral Oral Oral   SpO2: 99% 98% 100% 98%    Intake/Output Summary (Last 24 hours) at 03/15/2024 1022 Last data filed at 03/14/2024 2355 Gross per 24 hour  Intake 120 ml  Output --  Net 120 ml   There were no vitals filed for this visit.  Scheduled Meds:  ciprofloxacin   2 drop Both Eyes Q4H while awake   enoxaparin  (LOVENOX ) injection  40 mg Subcutaneous Q24H   insulin  aspart  0-9 Units Subcutaneous TID WC   sodium chloride  flush  3 mL  Intravenous Q12H   Continuous Infusions:  sodium chloride  75 mL/hr at 03/14/24 1727   sodium chloride       Nutritional status     There is no height or weight on file to calculate BMI.  Data Reviewed:   CBC: Recent Labs  Lab 03/14/24 1205 03/15/24 0338  WBC 15.0* 10.2  HGB 13.8 13.1  HCT 42.7 38.6*  MCV 96.4 91.9  PLT 170 170   Basic Metabolic Panel: Recent Labs  Lab 03/14/24 1205 03/15/24 0338  NA 137 138  K 4.3 3.8  CL 104 105  CO2 21* 23  GLUCOSE 109* 71  BUN 23 20  CREATININE 1.40* 1.27*  CALCIUM  9.3 8.8*   GFR: CrCl cannot be calculated (Unknown ideal weight.). Liver Function Tests: Recent Labs  Lab 03/14/24 1205  AST 37  ALT 16  ALKPHOS 84  BILITOT 0.9  PROT 7.3  ALBUMIN 3.9   No results for input(s): LIPASE, AMYLASE in the last 168 hours. Recent Labs  Lab 03/14/24 1320  AMMONIA 17   Coagulation Profile: Recent Labs  Lab 03/15/24 0338  INR 1.1   Cardiac Enzymes: Recent Labs  Lab 03/14/24 1214  CKTOTAL 1,140*   BNP (last 3 results) No results for input(s): PROBNP in the last 8760 hours. HbA1C: Recent Labs    03/14/24 2038  HGBA1C 5.3   CBG: Recent Labs  Lab 03/14/24 1158 03/14/24 1723 03/14/24 2133 03/15/24 0848 03/15/24 1017  GLUCAP 128* 80 78 57* 90   Lipid Profile: Recent Labs    03/15/24 0338  CHOL 174  HDL 53  LDLCALC 110*  TRIG 54  CHOLHDL 3.3   Thyroid Function Tests: Recent Labs    03/14/24 2038  TSH 2.965   Anemia Panel: Recent Labs    03/14/24 2038  VITAMINB12 311   Sepsis Labs: No results for input(s): PROCALCITON, LATICACIDVEN in the last 168 hours.  No results found for this or any previous visit (from the past 240 hours).       Radiology Studies: DG Chest Portable 1 View Result Date: 03/14/2024 CLINICAL DATA:  Altered mental status. EXAM: PORTABLE CHEST 1 VIEW COMPARISON:  02/23/2024. FINDINGS: Bilateral lung fields are clear. Bilateral costophrenic angles are clear.  Normal cardio-mediastinal silhouette. No acute osseous abnormalities. The soft tissues are within normal limits. IMPRESSION: No active disease. Electronically Signed   By: Ree Molt M.D.   On: 03/14/2024 13:37   CT Head Wo Contrast Result Date: 03/14/2024 CLINICAL DATA:  AMS EXAM: CT HEAD WITHOUT CONTRAST TECHNIQUE: Contiguous axial images were obtained from the base of the skull through the vertex without intravenous contrast. RADIATION DOSE REDUCTION: This exam was performed according to the departmental dose-optimization program which includes automated exposure control, adjustment of the mA and/or kV according to patient size and/or use of iterative reconstruction technique. COMPARISON:  January 29, 2017 FINDINGS: Brain: The ventricles appear age appropriate. No mass effect or midline shift.  Gray-white differentiation is preserved.Periventricular and subcortical white matter hypoattenuation, most consistent with changes of moderate chronic ischemic microvascular disease.No evidence of acute territorial infarction, extra-axial fluid collection, hemorrhage, or mass lesion. 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 The basilar cisterns are patent without downward herniation. The cerebellar hemispheres and vermis are well formed without mass lesion or focal attenuation abnormality. Vascular: No hyperdense vessel. Skull: Normal. Negative for fracture or focal lesion. Sinuses/Orbits: The paranasal sinuses and mastoids are clear.The globes appear intact. No retrobulbar hematoma. Other: None. IMPRESSION: 1. No acute intracranial abnormality, specifically, no acute hemorrhage, territorial infarction, or intracranial mass. 2. Sequelae of chronic ischemic microvascular disease. Electronically Signed   By: Rogelia Myers M.D.   On: 03/14/2024 13:22           LOS: 0 days   Time spent= 39 mins    Deliliah Room, MD [facility]  If 7PM-7AM, please contact  night-coverage  03/15/2024, 10:22 AM

## 2024-03-16 ENCOUNTER — Inpatient Hospital Stay (HOSPITAL_COMMUNITY)

## 2024-03-16 DIAGNOSIS — R569 Unspecified convulsions: Secondary | ICD-10-CM

## 2024-03-16 DIAGNOSIS — R4182 Altered mental status, unspecified: Secondary | ICD-10-CM | POA: Diagnosis not present

## 2024-03-16 LAB — BASIC METABOLIC PANEL WITH GFR
Anion gap: 9 (ref 5–15)
BUN: 25 mg/dL — ABNORMAL HIGH (ref 8–23)
CO2: 24 mmol/L (ref 22–32)
Calcium: 8.8 mg/dL — ABNORMAL LOW (ref 8.9–10.3)
Chloride: 104 mmol/L (ref 98–111)
Creatinine, Ser: 1.36 mg/dL — ABNORMAL HIGH (ref 0.61–1.24)
GFR, Estimated: 54 mL/min — ABNORMAL LOW (ref >60)
Glucose, Bld: 72 mg/dL (ref 70–99)
Potassium: 4.3 mmol/L (ref 3.5–5.1)
Sodium: 137 mmol/L (ref 135–145)

## 2024-03-16 LAB — GLUCOSE, CAPILLARY
Glucose-Capillary: 81 mg/dL (ref 70–99)
Glucose-Capillary: 74 mg/dL (ref 70–99)
Glucose-Capillary: 82 mg/dL (ref 70–99)
Glucose-Capillary: 78 mg/dL (ref 70–99)

## 2024-03-16 LAB — CBC WITH DIFFERENTIAL/PLATELET
Abs Immature Granulocytes: 0.02 K/uL (ref 0.00–0.07)
Basophils Absolute: 0 K/uL (ref 0.0–0.1)
Basophils Relative: 1 %
Eosinophils Absolute: 0.1 K/uL (ref 0.0–0.5)
Eosinophils Relative: 1 %
HCT: 38.7 % — ABNORMAL LOW (ref 39.0–52.0)
Hemoglobin: 12.8 g/dL — ABNORMAL LOW (ref 13.0–17.0)
Immature Granulocytes: 0 %
Lymphocytes Relative: 21 %
Lymphs Abs: 1.7 K/uL (ref 0.7–4.0)
MCH: 30.9 pg (ref 26.0–34.0)
MCHC: 33.1 g/dL (ref 30.0–36.0)
MCV: 93.5 fL (ref 80.0–100.0)
Monocytes Absolute: 1.1 K/uL — ABNORMAL HIGH (ref 0.1–1.0)
Monocytes Relative: 13 %
Neutro Abs: 5.3 K/uL (ref 1.7–7.7)
Neutrophils Relative %: 64 %
Platelets: 166 K/uL (ref 150–400)
RBC: 4.14 MIL/uL — ABNORMAL LOW (ref 4.22–5.81)
RDW: 13.5 % (ref 11.5–15.5)
WBC: 8.3 K/uL (ref 4.0–10.5)
nRBC: 0 % (ref 0.0–0.2)

## 2024-03-16 LAB — CK: Total CK: 820 U/L — ABNORMAL HIGH (ref 49–397)

## 2024-03-16 LAB — LEVETIRACETAM LEVEL: Levetiracetam Lvl: <2 ug/mL — ABNORMAL LOW (ref 10.0–40.0)

## 2024-03-16 MED ORDER — HALOPERIDOL LACTATE 5 MG/ML IJ SOLN
1.0000 mg | Freq: Four times a day (QID) | INTRAMUSCULAR | Status: DC | PRN
  Administered 2024-03-17: 1 mg via INTRAVENOUS
  Filled 2024-03-16 (×2): qty 1

## 2024-03-16 MED ORDER — ALUM & MAG HYDROXIDE-SIMETH 200-200-20 MG/5ML PO SUSP: 30.0000 mL | Freq: Four times a day (QID) | ORAL | Status: DC | PRN

## 2024-03-16 MED ORDER — MELATONIN 3 MG PO TABS
6.0000 mg | ORAL_TABLET | Freq: Every evening | ORAL | Status: DC | PRN
  Administered 2024-03-16 – 2024-03-22 (×8): 6 mg via ORAL
  Filled 2024-03-16 (×8): qty 2

## 2024-03-16 NOTE — Evaluation (Signed)
 Occupational Therapy Evaluation Patient Details Name: Terry Baxter MRN: [id number] DOB: 09-25-1945 Today's Date: 03/16/2024   History of Present Illness   Patient is a 78 y/o male admitted 03/14/24 with AMS and weakness after family found him no the floor.  CTH unremarkable and EEG pending.  Felt possibly due to volume depletion. Positive for mild rhabdomyolysis. PMH positive for seizure disorder, chronic back pain, bipolar disorder, HTN CKD II, HTN and Hep C.     Clinical Impressions Prior to this admission, patient living alone with his brother checking on him. Patient unable to share history with OT and disoriented. Patient also stating he was in pain and grimacing, but refusing pain medication or any other assistance. Patient willing to sit EOB, but not to complete functional mobility. Patient reoriented, and presenting with cognitive deficits, decreased activity tolerance, and increased assistance for ADLs. OT recommending rehab of a lesser intensity < 3 hours prior to return home. OT will continue to follow.      If plan is discharge home, recommend the following:   A little help with walking and/or transfers;A little help with bathing/dressing/bathroom;Assistance with cooking/housework;Direct supervision/assist for medications management;Direct supervision/assist for financial management;Assist for transportation;Help with stairs or ramp for entrance;Supervision due to cognitive status     Functional Status Assessment   Patient has had a recent decline in their functional status and/or demonstrates limited ability to make significant improvements in function in a reasonable and predictable amount of time     Equipment Recommendations   None recommended by OT (defer to next venue)     Recommendations for Other Services         Precautions/Restrictions   Precautions Precautions: Fall Recall of Precautions/Restrictions: Impaired Restrictions Weight Bearing  Restrictions Per Provider Order: No     Mobility Bed Mobility Overal bed mobility: Needs Assistance Bed Mobility: Supine to Sit, Sit to Supine     Supine to sit: Supervision Sit to supine: Supervision   General bed mobility comments: Extra time needed, but able to complete incremental scoots to [facility]    Transfers Overall transfer level: Needs assistance                 General transfer comment: declining despite encouragement      Balance Overall balance assessment: Needs assistance Sitting-balance support: Bilateral upper extremity supported, Feet supported Sitting balance-Leahy Scale: Fair Sitting balance - Comments: did not challenge                                   ADL either performed or assessed with clinical judgement   ADL Overall ADL's : Needs assistance/impaired Eating/Feeding: Set up;Sitting   Grooming: Set up;Sitting   Upper Body Bathing: Set up;Sitting   Lower Body Bathing: Contact guard assist;Sit to/from stand;Sitting/lateral leans   Upper Body Dressing : Set up;Sitting   Lower Body Dressing: Contact guard assist;Sitting/lateral leans;Sit to/from Market researcher Details (indicate cue type and reason): not assessed despite encouragement Toileting- Clothing Manipulation and Hygiene: Contact guard assist;Sitting/lateral lean;Sit to/from stand       Functional mobility during ADLs: Contact guard assist;Cueing for safety;Cueing for sequencing General ADL Comments: Prior to this admission, patient living alone with his brother checking on him. Patient unable to share history with OT and disoriented. Patient also stating he was in pain and grimacing, but refusing pain medication or any other assistance. Patient willing to sit EOB, but not  to complete functional mobility. Patient reoriented, and presenting with cognitive deficits, decreased activity tolerance, and increased assistance for ADLs. OT recommending rehab of a lesser  intensity < 3 hours prior to return home. OT will continue to follow.     Vision Baseline Vision/History: 0 No visual deficits Ability to See in Adequate Light: 0 Adequate Patient Visual Report: No change from baseline Vision Assessment?: Wears glasses for reading     Perception Perception: Not tested       Praxis Praxis: Not tested       Pertinent Vitals/Pain Pain Assessment Pain Assessment: Faces Faces Pain Scale: Hurts whole lot Pain Location: unable to localize, stating I hurt everywhere Pain Descriptors / Indicators: Aching, Discomfort, Guarding, Grimacing Pain Intervention(s): Limited activity within patient's tolerance, Monitored during session, Repositioned     Extremity/Trunk Assessment Upper Extremity Assessment Upper Extremity Assessment: Right hand dominant;Overall WFL for tasks assessed (appears to have tardive dyskinesia)   Lower Extremity Assessment Lower Extremity Assessment: Defer to PT evaluation   Cervical / Trunk Assessment Cervical / Trunk Assessment: Kyphotic   Communication Communication Communication: No apparent difficulties   Cognition Arousal: Lethargic Behavior During Therapy: Flat affect Cognition: Cognition impaired   Orientation impairments: Time, Situation Awareness: Intellectual awareness impaired, Online awareness impaired Memory impairment (select all impairments): Short-term memory, Working [employer] fast streamer, Non-declarative long-term memory, Geneticist, molecular long-term memory Attention impairment (select first level of impairment): Focused attention Executive functioning impairment (select all impairments): Initiation, Organization, Sequencing, Reasoning, Problem solving OT - Cognition Comments: Patient unaware of why he is at the hospital, states it is March, did not converse much otherwise                 Following commands: Impaired Following commands impaired: Follows multi-step commands inconsistently, Follows one step commands with  increased time     Cueing  General Comments   Cueing Techniques: Verbal cues;Gestural cues;Tactile cues      Exercises     Shoulder Instructions      Home Living Family/patient expects to be discharged to:: Private residence Living Arrangements: Alone   Type of Home: House Home Access: Level entry     Home Layout: One level     Bathroom Shower/Tub: Producer, television/film/video: Standard     Home Equipment: Cane - single point;Shower seat   Additional Comments: reports one fall in past 6 months      Prior Functioning/Environment Prior Level of Function : History of Falls (last six months)             Mobility Comments: states has a friend that can help if needed ADLs Comments: cooks and cleans    OT Problem List: Decreased strength;Decreased activity tolerance;Impaired balance (sitting and/or standing);Decreased coordination;Decreased cognition;Decreased safety awareness;Decreased knowledge of use of DME or AE;Pain   OT Treatment/Interventions: Self-care/ADL training;Therapeutic exercise;Energy conservation;DME and/or AE instruction;Manual therapy;Therapeutic activities;Cognitive remediation/compensation;Patient/family education;Balance training      OT Goals(Current goals can be found in the care plan section)   Acute Rehab OT Goals Patient Stated Goal: unable OT Goal Formulation: Patient unable to participate in goal setting Time For Goal Achievement: 03/30/24 Potential to Achieve Goals: Fair ADL Goals Pt Will Perform Lower Body Bathing: with modified independence;sitting/lateral leans;sit to/from stand Pt Will Perform Lower Body Dressing: with modified independence;sit to/from stand;sitting/lateral leans Pt Will Transfer to Toilet: with modified independence;ambulating;regular height toilet;grab bars Pt Will Perform Toileting - Clothing Manipulation and hygiene: with modified independence;sitting/lateral leans;sit to/from stand Additional ADL  Goal #1: Patient will  be able to complete trail making task or upper level cognitive task without correction to errors in order to be able to return home safely. Additional ADL Goal #2: Patient will be able to complete functional task in standing for 3 minutes prior to needing seated rest break in order to improve overall activity tolerance.   OT Frequency:  Min 2X/week    Co-evaluation              AM-PAC OT 6 Clicks Daily Activity     Outcome Measure Help from another person eating meals?: A Little Help from another person taking care of personal grooming?: A Little Help from another person toileting, which includes using toliet, bedpan, or urinal?: A Little Help from another person bathing (including washing, rinsing, drying)?: A Little Help from another person to put on and taking off regular upper body clothing?: A Little Help from another person to put on and taking off regular lower body clothing?: A Little 6 Click Score: 18   End of Session Nurse Communication: Mobility status  Activity Tolerance: Patient limited by fatigue;Patient limited by lethargy;Patient limited by pain Patient left: in bed;with call bell/phone within reach;with bed alarm set  OT Visit Diagnosis: Unsteadiness on feet (R26.81);Other abnormalities of gait and mobility (R26.89);Repeated falls (R29.6);History of falling (Z91.81);Muscle weakness (generalized) (M62.81);Other symptoms and signs involving cognitive function;Pain                Time: 8891-8879 OT Time Calculation (min): 12 min Charges:  OT General Charges $OT Visit: 1 Visit OT Evaluation $OT Eval Moderate Complexity: 1 Mod  Ronal Gift E. Artina Minella, OTR/L Acute Rehabilitation Services [phone]   Ronal Gift Salt 03/16/2024, 3:13 PM

## 2024-03-16 NOTE — Progress Notes (Signed)
 Upon arriving to room pt noted to say  I want to die' this nurse asked pt why and has he thought about this pt stated  theres no reason I just dont want to be here I want to get out of here this nurse did room search and removed all items notified MD who placed order for sitter and psych consult

## 2024-03-16 NOTE — TOC Initial Note (Signed)
 Transition of Care [facility]) - Initial/Assessment Note    Patient Details  Name: Terry Baxter MRN: [id number] Date of Birth: 1945/12/10  [facility] CM/SW Contact:    Terry Abdulla A [place], LCSW Phone Number: 03/16/2024, 10:04 AM  Clinical Narrative:                  Pt from home alone, pt's brother lives across the street and has been assisting pt for support at home. CSW met with pt at bedside, was sleeping, could not be roused, per pt's chart, only oriented x1 to x2, not able to hold conversation.  CSW spoke with pt's brother, Terry Baxter, notified of possible SNF recommendation, he was agreeable. Pt been to [facility] in the past, open to going back.   SNF workup completed, bed offers pending.   Expected Discharge Plan: Skilled Nursing Facility Barriers to Discharge: English as a second language teacher, SNF Pending bed offer, Continued Medical Work up   Patient Goals and CMS Choice Patient states their goals for this hospitalization and ongoing recovery are:: pt's brother said get stronger          Expected Discharge Plan and Services       Living arrangements for the past 2 months: Apartment                                      Prior Living Arrangements/Services Living arrangements for the past 2 months: Apartment Lives with:: Self          Need for Family Participation in Patient Care: Yes (Comment) Care giver support system in place?: Yes (comment) (pt's brother, Terry Baxter)      Activities of Daily Living      Permission Sought/Granted                  Emotional Assessment Appearance:: Appears stated age Attitude/Demeanor/Rapport: Lethargic Affect (typically observed): Quiet Orientation: : Oriented to Self, Oriented to Place Alcohol  / Substance Use: Not Applicable Psych Involvement: No (comment)  Admission diagnosis:  Altered mental status [R41.82] Confusion and disorientation [R41.0] Patient Active Problem List   Diagnosis Date Noted    Altered mental status 03/14/2024   History of hepatitis C    Elevated AST (SGOT) 01/31/2017   Acute lower UTI 01/30/2017   Insomnia 01/30/2017   Acute on chronic renal failure (HCC) 01/29/2017   Hyperkalemia 01/29/2017   Weakness 01/29/2017   AKI (acute kidney injury) (HCC) 01/29/2017   Hypertension 01/06/2017   Morbid obesity (HCC) 01/06/2017   Hiatal hernia 01/06/2017   Sepsis (HCC) 06/23/2016   UTI (urinary tract infection) 06/23/2016   HLD (hyperlipidemia) 02/08/2015   Seizures (HCC) 02/08/2015   Type 2 diabetes mellitus with other circulatory complications (HCC) 02/08/2015   Stroke with cerebral ischemia (HCC)    Bipolar disorder (HCC) 11/10/2014   History of opioid abuse (HCC) 11/10/2014   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 [place] [address] Phone: [phone] Fax: [phone]  Terry Baxter [facility] [address] [place] [address]  Phone: [phone] Fax: [phone]     Social Drivers of Health (SDOH) Social History: SDOH Screenings   Food Insecurity: No Food Insecurity (03/16/2024)  Housing: Unknown (03/16/2024)  Transportation Needs: No Transportation Needs (03/16/2024)  Utilities: Not At Risk (03/16/2024)  Social Connections: Socially Isolated (03/16/2024)  Tobacco Use: Low Risk  (03/14/2024)   SDOH Interventions:     Readmission Risk Interventions     No data to display

## 2024-03-16 NOTE — Progress Notes (Signed)
 Refused to go to MRI educated pt cont to refuse daughter called and made aware MD notified

## 2024-03-16 NOTE — Progress Notes (Signed)
 EEG complete - results pending

## 2024-03-16 NOTE — [place] FL2 (Addendum)
 [place]  MEDICAID [id number] LEVEL OF CARE FORM     IDENTIFICATION  Patient Name: Terry Baxter Birthdate: 1945/09/17 Sex: male Admission Date (Current Location): 03/14/2024  [facility] and [place] Number:  Producer, television/film/video and Address:  [facility], [address], [place], [place] [address]      Provider Number: [id number]  Attending Physician Name and Address:  Cheryle Page, MD  Relative Name and Phone Number:  Levora Rush (Brother)  [phone]    Current Level of Care: Hospital Recommended Level of Care: Skilled Nursing Facility Prior Approval Number:    Date Approved/Denied:   PASRR Number: [id number]  Discharge Plan: SNF    Current Diagnoses: Patient Active Problem List   Diagnosis Date Noted   Altered mental status 03/14/2024   History of hepatitis C    Elevated AST (SGOT) 01/31/2017   Acute lower UTI 01/30/2017   Insomnia 01/30/2017   Acute on chronic renal failure (HCC) 01/29/2017   Hyperkalemia 01/29/2017   Weakness 01/29/2017   AKI (acute kidney injury) (HCC) 01/29/2017   Hypertension 01/06/2017   Morbid obesity (HCC) 01/06/2017   Hiatal hernia 01/06/2017   Sepsis (HCC) 06/23/2016   UTI (urinary tract infection) 06/23/2016   HLD (hyperlipidemia) 02/08/2015   Seizures (HCC) 02/08/2015   Type 2 diabetes mellitus with other circulatory complications (HCC) 02/08/2015   Stroke with cerebral ischemia (HCC)    Bipolar disorder (HCC) 11/10/2014   History of opioid abuse (HCC) 11/10/2014   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) 11/10/2014   TIA (transient ischemic attack) 11/06/2014   Syncope 10/19/2013   Cannabis abuse 10/19/2013   Seizure disorder (HCC) 10/19/2013   CKD (chronic kidney disease), stage II 10/19/2013   Edema 05/05/2013   Cardiomegaly 05/05/2013   Hepatitis C     Orientation RESPIRATION BLADDER Height & Weight     Self, Place  Normal Continent Weight: 189 lb 11.2 oz (86 kg) Height:      BEHAVIORAL SYMPTOMS/MOOD NEUROLOGICAL Convulsions/Seizures BOWEL NUTRITION STATUS      Continent Diet (see DC summary)  AMBULATORY STATUS COMMUNICATION OF NEEDS Skin   Extensive Assist Verbally Normal                       Personal Care Assistance Level of Assistance  Bathing, Feeding, Dressing Bathing Assistance: Maximum assistance Feeding assistance: Limited assistance Dressing Assistance: Maximum assistance     Functional Limitations Info  Sight, Hearing, Speech Sight Info: Impaired Hearing Info: Adequate Speech Info: Adequate    SPECIAL CARE FACTORS FREQUENCY  PT (By licensed PT), OT (By licensed OT)     PT Frequency: 5x/week OT Frequency: 5x/week            Contractures Contractures Info: Not present    Additional Factors Info  Code Status, Allergies Code Status Info: FULL Allergies Info: Tizanidine  Vicodin (Hydrocodone -acetaminophen )           Current Medications (03/16/2024):  This is the current hospital active medication list Current Facility-Administered Medications  Medication Dose Route Frequency Provider Last Rate Last Admin   ciprofloxacin  (CILOXAN ) 0.3 % ophthalmic solution 2 drop  2 drop Both Eyes Q4H while awake Pokhrel, Laxman, MD   2 drop at 03/16/24 0844   enoxaparin  (LOVENOX ) injection 40 mg  40 mg Subcutaneous Q24H Pokhrel, Laxman, MD   40 mg at 03/15/24 1712   folic acid  (FOLVITE ) tablet 1 mg  1 mg Oral Daily Rashid, Farhan, MD   1 mg at  03/16/24 0843   haloperidol  lactate (HALDOL ) injection 1 mg  1 mg Intravenous Q6H PRN Segars, Jonathan, MD       hydrALAZINE  (APRESOLINE ) injection 5 mg  5 mg Intravenous Q6H PRN Pokhrel, Laxman, MD   5 mg at 03/15/24 2151   insulin  aspart (novoLOG ) injection 0-9 Units  0-9 Units Subcutaneous TID WC Pokhrel, Laxman, MD       levETIRAcetam  (KEPPRA ) tablet 500 mg  500 mg Oral BID Rashid, Farhan, MD   500 mg at 03/16/24 0844   melatonin tablet 6 mg  6 mg Oral QHS PRN Segars, Jonathan, MD   6 mg at  03/16/24 9685   ondansetron  (ZOFRAN ) tablet 4 mg  4 mg Oral Q6H PRN Pokhrel, Laxman, MD       Or   ondansetron  (ZOFRAN ) injection 4 mg  4 mg Intravenous Q6H PRN Pokhrel, Laxman, MD       polyethylene glycol (MIRALAX  / GLYCOLAX ) packet 17 g  17 g Oral Daily PRN Pokhrel, Laxman, MD       sodium chloride  flush (NS) 0.9 % injection 3 mL  3 mL Intravenous Q12H Pokhrel, Laxman, MD   3 mL at 03/16/24 0844   sodium chloride  flush (NS) 0.9 % injection 3 mL  3 mL Intravenous PRN Pokhrel, Laxman, MD       thiamine  (VITAMIN B1) tablet 100 mg  100 mg Oral Daily Rashid, Farhan, MD   100 mg at 03/16/24 9155     Discharge Medications: Please see discharge summary for a list of discharge medications.  Relevant Imaging Results:  Relevant Lab Results:   Additional Information [id number]  Adisa Vigeant A [place], LCSW

## 2024-03-16 NOTE — Procedures (Signed)
 Patient Name: Terry Baxter  MRN: [id number]  Epilepsy Attending: Arlin MALVA Krebs  Referring Physician/Provider: Cheryle Page, MD  Date: 03/16/2024 Duration: 22.24 mins  Patient history: 78yo m with ams. EEG to evaluate for seizure  Level of alertness: Awake  AEDs during EEG study: LEV  Technical aspects: This EEG study was done with scalp electrodes positioned according to the 10-20 International system of electrode placement. Electrical activity was reviewed with band pass filter of 1-70Hz , sensitivity of 7 uV/mm, display speed of 25mm/sec with a 60Hz  notched filter applied as appropriate. EEG data were recorded continuously and digitally stored.  Video monitoring was available and reviewed as appropriate.  Description: The posterior dominant rhythm consists of 9-10 Hz activity of moderate voltage (25-35 uV) seen predominantly in posterior head regions, symmetric and reactive to eye opening and eye closing. Drowsiness was characterized by attenuation of the posterior background rhythm. Sleep was characterized by vertex waves, maximal frontocentral region. Hyperventilation and photic stimulation were not performed.     IMPRESSION: This study is within normal limits. No seizures or epileptiform discharges were seen throughout the recording.  A normal interictal EEG does not exclude the diagnosis of epilepsy.   Kortlyn Koltz O Debra Calabretta

## 2024-03-16 NOTE — Progress Notes (Signed)
 PROGRESS NOTE    Terry Baxter  FMW:[id number] DOB: 29-Sep-1945 DOA: 03/14/2024 PCP: Andria Pipe, MD   Brief Narrative:  78 years old male with documented history of bipolar disorder, chronic back pain, hepatitis C and history of heroin abuse, history of TIA, hyperlipidemia, hypertension, seizure disorder and type 2 diabetes melitis presented with altered mental status; family member found him on the floor.  On presentation, CT of the head was unremarkable.  He could not tolerate MRI of brain.  Creatinine of 1.4, total CK at 1140 with WBC of 15.  Urine drug screen negative.  Chest x-ray without any infiltrate.  He was started on IV fluids.  Assessment & Plan:   Acute metabolic encephalopathy: Present on admission -Likely multifactorial from volume depletion along with narcotic use - Imaging as above. -Monitor mental status -Fall precautions/seizure precautions. -PT recommending SNF placement.  Consult TOC - Ammonia, B12 and TSH levels normal  Fall Elevated CK/mild rhabdomyolysis Generalized weakness/physical deconditioning - CK improving to 820 this morning.  Treated with IV fluids and subsequently discontinued.  Encourage oral intake. - Fall precautions.  Volume depletion/dehydration -Continue with IV fluids and discontinued  CKD stage IIIa - Creatinine currently at baseline.  History of seizure disorder - Continue Keppra   Diabetes mellitus type 2-A1c 5.3.  Continue CBGs with SSI.  Chronic back pain with narcotic dependence -Oxycodone  on hold for now.  Outpatient follow-up with PCP and/or pain management  Anxiety/bipolar disorder-supposed to be on Wellbutrin , Klonopin  and risperidone  as an outpatient but patient does not take them.  Bilateral conjunctival injection: Possibility of conjunctivitis - Continue ciprofloxacin  eyedrops  Hypertension - Blood pressure currently stable.  Not on any antihypertensive currently  Obesity class I - Outpatient follow-up  DVT  prophylaxis: Lovenox  Code Status: Full Family Communication: None at bedside Disposition Plan: Status is: Inpatient Remains inpatient appropriate because: Of severity of illness    Consultants: None  Procedures: None  Antimicrobials: None   Subjective: Patient seen and examined at bedside.  Poor historian.  No seizures, fever, vomiting reported.  Objective: Vitals:   03/15/24 2139 03/16/24 0000 03/16/24 0443 03/16/24 0445  BP: (!) 176/145 112/78 111/80   Pulse: 67 67 67   Resp:  18 17   Temp: 98.4 F (36.9 C) 98.4 F (36.9 C) 98.4 F (36.9 C)   TempSrc: Oral Oral Oral   SpO2: 95% 97% 99%   Weight:    86 kg    Intake/Output Summary (Last 24 hours) at 03/16/2024 0756 Last data filed at 03/16/2024 0446 Gross per 24 hour  Intake --  Output 400 ml  Net -400 ml   Filed Weights   03/16/24 0445  Weight: 86 kg    Examination:  General exam: Appears calm and comfortable.  Elderly male lying in bed. Respiratory system: Bilateral decreased breath sounds at bases, no wheezing Cardiovascular system: S1 & S2 heard, Rate controlled Gastrointestinal system: Abdomen is obese, nondistended, soft and nontender. Normal bowel sounds heard. Extremities: No cyanosis, clubbing, edema  Central nervous system: Alert, still slightly confused to time.  Slow to respond.  Poor historian.  No focal neurological deficits. Moving extremities Skin: No rashes, lesions or ulcers Psychiatry: Flat affect.  Not agitated currently.    Data Reviewed: I have personally reviewed following labs and imaging studies  CBC: Recent Labs  Lab 03/14/24 1205 03/15/24 0338 03/16/24 0148  WBC 15.0* 10.2 8.3  NEUTROABS  --   --  5.3  HGB 13.8 13.1 12.8*  HCT 42.7 38.6* 38.7*  MCV 96.4 91.9 93.5  PLT 170 170 166   Basic Metabolic Panel: Recent Labs  Lab 03/14/24 1205 03/15/24 0338 03/16/24 0148  NA 137 138 137  K 4.3 3.8 4.3  CL 104 105 104  CO2 21* 23 24  GLUCOSE 109* 71 72  BUN 23 20 25*   CREATININE 1.40* 1.27* 1.36*  CALCIUM  9.3 8.8* 8.8*   GFR: Estimated Creatinine Clearance: 47.5 mL/min (A) (by C-G formula based on SCr of 1.36 mg/dL (H)). Liver Function Tests: Recent Labs  Lab 03/14/24 1205  AST 37  ALT 16  ALKPHOS 84  BILITOT 0.9  PROT 7.3  ALBUMIN 3.9   No results for input(s): LIPASE, AMYLASE in the last 168 hours. Recent Labs  Lab 03/14/24 1320  AMMONIA 17   Coagulation Profile: Recent Labs  Lab 03/15/24 0338  INR 1.1   Cardiac Enzymes: Recent Labs  Lab 03/14/24 1214 03/16/24 0148  CKTOTAL 1,140* 820*   BNP (last 3 results) No results for input(s): PROBNP in the last 8760 hours. HbA1C: Recent Labs    03/14/24 2038  HGBA1C 5.3   CBG: Recent Labs  Lab 03/15/24 1017 03/15/24 1201 03/15/24 1304 03/15/24 1547 03/15/24 2137  GLUCAP 90 62* 113* 158* 93   Lipid Profile: Recent Labs    03/15/24 0338  CHOL 174  HDL 53  LDLCALC 110*  TRIG 54  CHOLHDL 3.3   Thyroid Function Tests: Recent Labs    03/14/24 2038  TSH 2.965   Anemia Panel: Recent Labs    03/14/24 2038  VITAMINB12 311   Sepsis Labs: No results for input(s): PROCALCITON, LATICACIDVEN in the last 168 hours.  Recent Results (from the past 240 hours)  Urine Culture     Status: None   Collection Time: 03/14/24  1:05 PM   Specimen: In/Out Cath Urine  Result Value Ref Range Status   Specimen Description IN/OUT CATH URINE  Final   Special Requests NONE  Final   Culture   Final    NO GROWTH Performed at [facility], [address], [place], [place] [address]    Report Status 03/15/2024 FINAL  Final         Radiology Studies: DG Chest Portable 1 View Result Date: 03/14/2024 CLINICAL DATA:  Altered mental status. EXAM: PORTABLE CHEST 1 VIEW COMPARISON:  02/23/2024. FINDINGS: Bilateral lung fields are clear. Bilateral costophrenic angles are clear. Normal cardio-mediastinal silhouette. No acute osseous abnormalities. The soft tissues are  within normal limits. IMPRESSION: No active disease. Electronically Signed   By: Ree Molt M.D.   On: 03/14/2024 13:37   CT Head Wo Contrast Result Date: 03/14/2024 CLINICAL DATA:  AMS EXAM: CT HEAD WITHOUT CONTRAST TECHNIQUE: Contiguous axial images were obtained from the base of the skull through the vertex without intravenous contrast. RADIATION DOSE REDUCTION: This exam was performed according to the departmental dose-optimization program which includes automated exposure control, adjustment of the mA and/or kV according to patient size and/or use of iterative reconstruction technique. COMPARISON:  January 29, 2017 FINDINGS: Brain: The ventricles appear age appropriate. No mass effect or midline shift. Gray-white differentiation is preserved.Periventricular and subcortical white matter hypoattenuation, most consistent with changes of moderate chronic ischemic microvascular disease.No evidence of acute territorial infarction, extra-axial fluid collection, hemorrhage, or mass lesion. 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 The basilar cisterns are patent without downward herniation. The cerebellar hemispheres and vermis are well formed without mass lesion or focal attenuation abnormality. Vascular: No hyperdense vessel. Skull:  Normal. Negative for fracture or focal lesion. Sinuses/Orbits: The paranasal sinuses and mastoids are clear.The globes appear intact. No retrobulbar hematoma. Other: None. IMPRESSION: 1. No acute intracranial abnormality, specifically, no acute hemorrhage, territorial infarction, or intracranial mass. 2. Sequelae of chronic ischemic microvascular disease. Electronically Signed   By: Rogelia Myers M.D.   On: 03/14/2024 13:22        Scheduled Meds:  ciprofloxacin   2 drop Both Eyes Q4H while awake   enoxaparin  (LOVENOX ) injection  40 mg Subcutaneous Q24H   folic acid   1 mg Oral Daily   insulin  aspart  0-9 Units Subcutaneous TID WC   levETIRAcetam   500 mg  Oral BID   sodium chloride  flush  3 mL Intravenous Q12H   thiamine   100 mg Oral Daily   Continuous Infusions:        Sophie Mao, MD [facility] 03/16/2024, 7:56 AM

## 2024-03-16 NOTE — Plan of Care (Signed)

## 2024-03-17 DIAGNOSIS — Z515 Encounter for palliative care: Secondary | ICD-10-CM | POA: Diagnosis not present

## 2024-03-17 DIAGNOSIS — Z7189 Other specified counseling: Secondary | ICD-10-CM | POA: Diagnosis not present

## 2024-03-17 DIAGNOSIS — Z66 Do not resuscitate: Secondary | ICD-10-CM

## 2024-03-17 DIAGNOSIS — R4182 Altered mental status, unspecified: Secondary | ICD-10-CM | POA: Diagnosis not present

## 2024-03-17 LAB — GLUCOSE, CAPILLARY
Glucose-Capillary: 86 mg/dL (ref 70–99)
Glucose-Capillary: 97 mg/dL (ref 70–99)
Glucose-Capillary: 82 mg/dL (ref 70–99)

## 2024-03-17 LAB — BASIC METABOLIC PANEL WITH GFR
Anion gap: 10 (ref 5–15)
BUN: 26 mg/dL — ABNORMAL HIGH (ref 8–23)
CO2: 22 mmol/L (ref 22–32)
Calcium: 8.5 mg/dL — ABNORMAL LOW (ref 8.9–10.3)
Chloride: 103 mmol/L (ref 98–111)
Creatinine, Ser: 1.35 mg/dL — ABNORMAL HIGH (ref 0.61–1.24)
GFR, Estimated: 54 mL/min — ABNORMAL LOW (ref >60)
Glucose, Bld: 85 mg/dL (ref 70–99)
Potassium: 3.9 mmol/L (ref 3.5–5.1)
Sodium: 135 mmol/L (ref 135–145)

## 2024-03-17 LAB — CK: Total CK: 417 U/L — ABNORMAL HIGH (ref 49–397)

## 2024-03-17 LAB — MAGNESIUM: Magnesium: 2.1 mg/dL (ref 1.7–2.4)

## 2024-03-17 LAB — VITAMIN B1: Vitamin B1 (Thiamine): 135 nmol/L (ref 66.5–200.0)

## 2024-03-17 MED ORDER — KETOROLAC TROMETHAMINE 15 MG/ML IJ SOLN
15.0000 mg | Freq: Once | INTRAMUSCULAR | Status: AC
  Filled 2024-03-17: qty 1

## 2024-03-17 MED ORDER — METHYLPREDNISOLONE SODIUM SUCC 125 MG IJ SOLR
125.0000 mg | Freq: Once | INTRAMUSCULAR | Status: DC
  Filled 2024-03-17: qty 2

## 2024-03-17 NOTE — Plan of Care (Signed)

## 2024-03-17 NOTE — Consult Note (Signed)
 Consultation Note Date: 03/17/2024   Patient Name: Terry Baxter  DOB: [birth year]  MRN: [id number]  Age / Sex: 78 y.o., male  PCP: Andria Pipe, MD Referring Physician: Cheryle Page, MD  Reason for Consultation: Establishing goals of care  HPI/Patient Profile: 78 y.o. male  with past medical history of bipolar disorder, chronic back pain, hepatitis C, history of heroin abuse, history of TIA, hyperlipidemia, hypertension, seizure disorder, and type 2 diabetes melitis admitted on 03/14/2024 with AMS. CT of the head was unremarkable. He could not tolerate MRI of brain. Creatinine of 1.4, total CK at 1140 with WBC of 15. Urine drug screen negative. Chest x-ray without any infiltrate. He was started on IV fluids. EEG negative for seizures. Patient was seen by psych who determined he was not a threat to himself and not in need of suicide precautions. PMT consulted to discuss GOC.   Clinical Assessment and Goals of Care: I have reviewed medical records including EPIC notes, labs and imaging, assessed the patient and then met with patient  to discuss diagnosis prognosis, GOC, EOL wishes, disposition and options.  I introduced Palliative Medicine as specialized medical care for people living with serious illness. It focuses on providing relief from the symptoms and stress of a serious illness. The goal is to improve quality of life for both the patient and the family.  We discussed a brief life review of the patient. He tells me he is from [place] . Moved to [place] earlier this year to be close to his younger brother. Tells me he is very close to his younger brother; tells me he raised him. His younger brother lives across the street from him. He tells me he has 2 daughters - one in [place] and one in [place]. He tells me he does not talk to the one in [place].   He tells me about his prior psychiatrist, treatment with risperidone  and development of tardive dyskinesia. He is  very troubled by this. Tells me the psychiatrist told him there was nothing he could do for this.   As far as functional and nutritional status he tells me he has been living independently and was able to care for himself independently. Did not use aid for ambulation. Had good appetite.   We discuss history of chronic pain - he tells me this doesn't bother him anymore.    We discussed patient's current illness and what it means in the larger context of patient's on-going co-morbidities.  We discussed current treatment with fluids. Other work up negative.   I attempted to elicit values and goals of care important to the patient.  He tells me his most important goal is to return home and remain at home. He hates being in the hospital. He is also adamant that he does not want to go to SNF. We discussed the hope that he could get stronger working with PT/OT at [facility] - he expresses understanding and tells me he would be open to home health helping him at home but not SNF.   We discuss surrogate decision maker if he were ever unable to make decisions for himself - we discuss daughters are next of kin. He tells me he would want his brother to be his HCPOA as he feels his brother knows him best and understands his wishes.   Discussed with patient the importance of continued conversation with family and the medical providers regarding overall plan of care and treatment options, ensuring decisions are within the context of the patient's  values and GOCs.    We discuss code status - full code vs DNR. He tells me he would never want resuscitation attempts or CPR. He tells me he hates being in the hospital and would not want aggressive medical care - we talked about temporary ventilation and he tells me he would not want this. He tells me about his faith in God and how he would want to pass naturally and it is his time as determined by God. Discussed code status change to DNR/DNI.   Questions and concerns were  addressed.   Patient asks that I call brother instead of his daughter. I attempted to call brother to review above - no answer and voicemail box full.   Primary Decision Maker PATIENT  To name brother as HCPOA  SUMMARY OF RECOMMENDATIONS   Code status change to DNR/DNI per patient's request to avoid aggressive medical care Patient interested in spiritual support from chaplain Would like to name brother as HCPOA  Code Status/Advance Care Planning: DNR     Primary Diagnoses: Present on Admission:  Altered mental status   I have reviewed the medical record, interviewed the patient and family, and examined the patient. The following aspects are pertinent.  Past Medical History:  Diagnosis Date   Anxiety    Arthritis    Bipolar 1 disorder (HCC)    followed by dr headen every 2 months   Chronic low back pain    goes to pain center   CKD (chronic kidney disease), stage II    DJD (degenerative joint disease)    lower back   Hepatitis    History of balanitis    History of hepatitis C    per pt treated w/ medication and cured  in 2014   History of heroin abuse (HCC)    per pt stopped and on was methadone until 10-19-2012--  none since   History of transient ischemic attack (TIA)    11-06-2014  ?tia--  MRA/ MRI normal   Hyperlipidemia    Hypertension    Nocturia    Phimosis    Seizure disorder [facility])    neurologist-  dr ary cummins--  last seizure 2014   Seizures (HCC)    once in 2010 none since   Type 2 diabetes, diet controlled (HCC)    diet controlled   Urinary incontinence    wear depends   Wears dentures    upper   Wears glasses    Social History   Socioeconomic History   Marital status: Legally Separated    Spouse name: Not on file   Number of children: Not on file   Years of education: Not on file   Highest education level: Not on file  Occupational History   Occupation: retired Runner, broadcasting/film/video  Tobacco Use   Smoking status: Never   Smokeless tobacco: Never   Vaping Use   Vaping status: Never Used  Substance and Sexual Activity   Alcohol  use: No   Drug use: No    Comment: h/o heroin and marijuana use quit 2014 per pt    Sexual activity: Never  Other Topics Concern   Not on file  Social History Narrative   Lives with room mate at home    Never married    Has 2 children, 4 years of college    1 cup of caffeine a week   Never smoked   Alcohol  none   Full code   Social Drivers of Corporate investment banker  Strain: Not on file  Food Insecurity: No Food Insecurity (03/16/2024)   Hunger Vital Sign    Worried About Running Out of Food in the Last Year: Never true    Ran Out of Food in the Last Year: Never true  Transportation Needs: No Transportation Needs (03/16/2024)   PRAPARE - Administrator, Civil Service (Medical): No    Lack of Transportation (Non-Medical): No  Physical Activity: Not on file  Stress: Not on file  Social Connections: Socially Isolated (03/16/2024)   Social Connection and Isolation Panel    Frequency of Communication with Friends and Family: Never    Frequency of Social Gatherings with Friends and Family: Never    Attends Religious Services: Never    Database administrator or Organizations: No    Attends Banker Meetings: Never    Marital Status: Never married   History reviewed. No pertinent family history. Scheduled Meds:  ciprofloxacin   2 drop Both Eyes Q4H while awake   enoxaparin  (LOVENOX ) injection  40 mg Subcutaneous Q24H   folic acid   1 mg Oral Daily   insulin  aspart  0-9 Units Subcutaneous TID WC   ketorolac   15 mg Intravenous Once   levETIRAcetam   500 mg Oral BID   methylPREDNISolone  (SOLU-MEDROL ) injection  125 mg Intravenous Once   sodium chloride  flush  3 mL Intravenous Q12H   thiamine   100 mg Oral Daily   Continuous Infusions: PRN Meds:.alum & mag hydroxide-simeth, haloperidol  lactate, hydrALAZINE , melatonin, ondansetron  **OR** ondansetron  (ZOFRAN ) IV, polyethylene  glycol, sodium chloride  flush Allergies  Allergen Reactions   Tizanidine Other (See Comments)    dizziness   Vicodin [Hydrocodone -Acetaminophen ] Other (See Comments)    Can't take due to bariatric surgery   Review of Systems  HENT:         Feels like he has glue in his mouth he attributes to TD    Physical Exam Constitutional:      General: He is not in acute distress. Pulmonary:     Effort: Pulmonary effort is normal.  Skin:    General: Skin is warm and dry.  Neurological:     Mental Status: He is alert and oriented to person, place, and time.  Psychiatric:     Comments: Occasional slight agitation r/t not being able to go home yet     Vital Signs: BP 117/68 (BP Location: Left Arm)   Pulse 63   Temp 98.5 F (36.9 C)   Resp 16   Wt 85.8 kg   SpO2 99%   BMI 30.53 kg/m  Pain Scale: Faces   Pain Score: 0-No pain   SpO2: SpO2: 99 % O2 Device:SpO2: 99 % O2 Flow Rate: .   IO: Intake/output summary:  Intake/Output Summary (Last 24 hours) at 03/17/2024 1306 Last data filed at 03/17/2024 9380 Gross per 24 hour  Intake 60 ml  Output 700 ml  Net -640 ml    LBM: Last BM Date : 03/14/24 Baseline Weight: Weight: 86 kg Most recent weight: Weight: 85.8 kg     Palliative Assessment/Data: PPS 60%     *Please note that this is a verbal dictation therefore any spelling or grammatical errors are due to the [employer] One system interpretation.   Time Total: 65 minutes Time spent includes: Detailed review of medical records (labs, imaging, vital signs), medically appropriate exam, discussion with treatment team, counseling and educating patient, family and/or staff, documenting clinical information, medication management and coordination of care.  Tobey Jama Barnacle, DNP, AGNP-C Palliative Medicine Team [phone] Pager: [phone]

## 2024-03-17 NOTE — Consult Note (Signed)
 [facility] Psychiatric Consult Initial  Patient Name: .Terry Baxter  MRN: [id number]  DOB: 05-Apr-1946  Consult Order details:  Orders (From admission, onward)     Start     Ordered   03/16/24 1402  IP CONSULT TO PSYCHIATRY       Ordering Provider: Cheryle Page, MD  Provider:  (Not yet assigned)  Question Answer Comment  Location [facility]   Reason for Consult? suicidal ideation      03/16/24 1401             Mode of Visit: In person    Psychiatry Consult Evaluation  Service Date: March 17, 2024 LOS:  LOS: 2 days  Consulted for: suicidal ideation  Primary Psychiatric Diagnoses  Adjustment disorder  Assessment  Terry Baxter is a 78 y.o. male admitted: Presented to the ED on 03/14/2024 11:37 AM for Acute Metabolic Ecephalopathy and excessive salivation. He carries the psychiatric diagnoses of Bipolar Disorder; TIA; Insomnia; tardive dyskinesia; history of opioid/cannabis abuse; past suicide attempt and has a past medical history of chronic back pain, hepatitis C, HLD, HTN, Seizure disorder, T2DM.   Consult was placed after nurse reported on 8/20 that pt said I want to die.SABRASABRAThere's no reason I just don't want to be here I want to get out of here.  His current presentation of irritability is most consistent with adjustment disorder. During patient interview, patient expressed that he had no intentions of wanting to harm himself.  He expressed that he is frustrated that he is here in the hospital and he wants to go home where he has his independence. The statement he made to the nurse appears to have been out of frustration. He is very bothered by having a sitter because he wants his space.  He enjoys his independence and lives next-door to his brother and enjoys watching westerns at home.  Denies any homicidal ideations, auditory hallucinations, and visual hallucinations. Pt does not have any clear modifiable risk factors for suicide.  Spoke with  daughter Terry Baxter for collateral [phone]), who also feels that the patient is safe.  States that he has not made concerning suicidal gestures or statements, and denies any concerns over his mental health.  States that he has had some anger lately which she attributes to him being in the hospital because he is normally a very independent person.  Overall, patient would not be likely to receive substantial benefit from inpatient psychiatric treatment, is at low risk of self harm, and does not require 1:1 monitoring with a sitter. Exam is mildly concerning for some hypoactive delirium vs continued encephalopathy and would recommend delirium precautions.  Diagnoses:  Active Hospital problems: Principal Problem:   Altered mental status Active Problems:   CKD (chronic kidney disease), stage II   Hypertension   History of hepatitis C    Plan   ## Psychiatric Medication Recommendations:  None  ## Medical Decision Making Capacity: Not specifically addressed in this encounter  ## Further Work-up:  -- most recent EKG on 03/17/2024 had QtC of 491 -- Pertinent labwork reviewed earlier this admission includes: Negative UDS, Ethanol, TSH. Improving CK. Nonconcerning CBC, CMP  ## Disposition:-- There are no psychiatric contraindications to discharge at this time  ## Behavioral / Environmental: -Delirium Precautions: Delirium Interventions for Nursing and Staff: - RN to open blinds every AM. - To Bedside: Glasses, hearing aide, and pt's own shoes. Make available to patients. when possible and encourage use. - Encourage po fluids when appropriate, keep  fluids within reach. - OOB to chair with meals. - Passive ROM exercises to all extremities with AM & PM care. - RN to assess orientation to person, time and place QAM and PRN. - Recommend extended visitation hours with familiar family/friends as feasible. - Staff to minimize disturbances at night. Turn off television when pt asleep or when not in  use.    ## Safety and Observation Level:  - Based on my clinical evaluation, I estimate the patient to be at Low risk of self harm in the current setting. - At this time, we recommend  routine observation. This decision is based on my review of the chart including patient's history and current presentation, interview of the patient, mental status examination, and consideration of suicide risk including evaluating suicidal ideation, plan, intent, suicidal or self-harm behaviors, risk factors, and protective factors. This judgment is based on our ability to directly address suicide risk, implement suicide prevention strategies, and develop a safety plan while the patient is in the clinical setting. Please contact our team if there is a concern that risk level has changed.  CSSR Risk Category:C-SSRS RISK CATEGORY: Low Risk  Suicide Risk Assessment: Patient has following modifiable risk factors for suicide: None Patient has following non-modifiable or demographic risk factors for suicide: male gender, separation or divorce, history of suicide attempt, and psychiatric hospitalization Patient has the following protective factors against suicide: Access to outpatient mental health care, Supportive family, and Frustration tolerance  Thank you for this consult request. Recommendations have been communicated to the primary team.  We will sign off at this time.   Penne Mori, DO       History of Present Illness  Relevant Aspects of [facility] Course:  Admitted on 03/14/2024 for Acute Metabolic Encephalopathy. Pt initially had worsened mental status (trying to urinate in trashcans, poor historian) but has appears to have improved since then. Pt had made a comment that I want to die.SABRASABRAThere's no reason I just don't want to be here I want to get out of here. So Psychiatry was consulted  Patient Report:  Patient was frustrated that psychiatry had been consulted because he considers himself to be  safe for discharge and would like to go home.  He denies suicidal ideations, homicidal ideation, visual hallucinations, auditory hallucinations.  He sees himself as an independent management independent his entire life family is irritated that he has a Comptroller and that he has to be here in the hospital.  Psych ROS:  Depression: Denies Anxiety: Denies Mania: Denies Psychosis: Denies   Collateral information:  Contacted daughter Terry Baxter at [phone]) who has no concerns for the patient's mental health.  Review of Systems  Respiratory: Negative.    Cardiovascular: Negative.   Gastrointestinal: Negative.   Psychiatric/Behavioral:  Negative for depression, hallucinations, substance abuse and suicidal ideas.      Psychiatric and Social History  Psychiatric History:   Per chart review: History of bipolar disorder, TD, substance abuse. Patient had a polysubstance intentional overdose in October 2024 on 225 mg of clonazepam , half a bottle of melatonin, and doxepin  which required ICU for stabilization.  A suicide note was written at that time, but family reports no further concerns for SI. Tardive dyskinesia started after patient was taking Risperdal  for bipolar disorder and have worsened after he hit his head on January 12, 2023 which has led to excessive salivation repetitive jaw movements.  Endorses 30 years of heroin use and 15 years of sobriety.  Denies access to guns.  Exam Findings   Vital Signs:  Temp:  [97.7 F (36.5 C)-98.8 F (37.1 C)] 98.5 F (36.9 C) (08/21 0759) Pulse Rate:  [63-66] 63 (08/21 0759) Resp:  [16] 16 (08/21 0518) BP: (93-117)/(63-74) 117/68 (08/21 0759) SpO2:  [98 %-100 %] 99 % (08/21 0759) Weight:  [85.8 kg] 85.8 kg (08/21 0518) Blood pressure 117/68, pulse 63, temperature 98.5 F (36.9 C), resp. rate 16, weight 85.8 kg, SpO2 99%. Body mass index is 30.53 kg/m.  Physical Exam Vitals reviewed.  Constitutional:      General: He is not in acute distress.     Appearance: He is not toxic-appearing.  Pulmonary:     Effort: Pulmonary effort is normal. No respiratory distress.  Skin:    General: Skin is warm and dry.  Neurological:     Mental Status: He is alert.     Mental Status Exam  Apperance: Appropriate for environment and Sitting upright Behavior: IRRITATED Speech: Normal Rate, Articulate, Normal Volume, and Responsive Attitude: Cooperative and DEFENSIVE Mood: ok Affect: Euthymic, Normal Range, and Mood Congruent Perception: Not responding to internal stimuli Thought Content: within normal limits Thought Form: Goal Directed, Organized, Linear, and Logical Cognition: Alert & Oriented to person, place, and time, Recent and Remote memory grossly intact by recounting personal history, Immediate memory grossly intact by interview, and Attention impaired per [facility] w/ 3 errors. Judgment: Fair Insight: Fair   Key Points: Denies SI, HI, A&VH   Other History   These have been pulled in through the EMR, reviewed, and updated if appropriate.  Family History:  The patient's family history is not on file.  Medical History: Past Medical History:  Diagnosis Date   Anxiety    Arthritis    Bipolar 1 disorder (HCC)    followed by dr headen every 2 months   Chronic low back pain    goes to pain center   CKD (chronic kidney disease), stage II    DJD (degenerative joint disease)    lower back   Hepatitis    History of balanitis    History of hepatitis C    per pt treated w/ medication and cured  in 2014   History of heroin abuse (HCC)    per pt stopped and on was methadone until 10-19-2012--  none since   History of transient ischemic attack (TIA)    11-06-2014  ?tia--  MRA/ MRI normal   Hyperlipidemia    Hypertension    Nocturia    Phimosis    Seizure disorder [facility])    neurologist-  dr ary cummins--  last seizure 2014   Seizures (HCC)    once in 2010 none since   Type 2 diabetes, diet controlled (HCC)    diet controlled    Urinary incontinence    wear depends   Wears dentures    upper   Wears glasses     Surgical History: Past Surgical History:  Procedure Laterality Date   BACK SURGERY  1980's   lower back   CIRCUMCISION N/A 04/25/2016   Procedure: CIRCUMCISION ADULT;  Surgeon: Ricardo Likens, MD;  Location[facility];  Service: Urology;  Laterality: N/A;   COLONOSCOPY  2013   EYE SURGERY Bilateral 09/2016   ioc lens implant for cataracts   LAPAROSCOPIC GASTRIC SLEEVE RESECTION N/A 01/06/2017   Procedure: LAPAROSCOPIC GASTRIC SLEEVE RESECTION WITH UPPER ENDO,HIATAL HERNIA REPAIR;  Surgeon: Tanda Locus, MD;  Location: [facility];  Service: General;  Laterality: N/A;   NEGATIVE SLEEP  STUDY  2009  per pt   surgeru for infection of sternum  1990   TRANSTHORACIC ECHOCARDIOGRAM  11/06/2014   ef 45-50%/  trivial MR and TR     Medications:   Current Facility-Administered Medications:    alum & mag hydroxide-simeth (MAALOX/MYLANTA) 200-200-20 MG/5ML suspension 30 mL, 30 mL, Oral, Q6H PRN, Sundil, Subrina, MD   ciprofloxacin  (CILOXAN ) 0.3 % ophthalmic solution 2 drop, 2 drop, Both Eyes, Q4H while awake, Pokhrel, Laxman, MD, 2 drop at 03/17/24 9341   enoxaparin  (LOVENOX ) injection 40 mg, 40 mg, Subcutaneous, Q24H, Pokhrel, Laxman, MD, 40 mg at 03/16/24 1650   folic acid  (FOLVITE ) tablet 1 mg, 1 mg, Oral, Daily, Rashid, Farhan, MD, 1 mg at 03/16/24 9156   haloperidol  lactate (HALDOL ) injection 1 mg, 1 mg, Intravenous, Q6H PRN, Segars, Jonathan, MD   hydrALAZINE  (APRESOLINE ) injection 5 mg, 5 mg, Intravenous, Q6H PRN, Pokhrel, Laxman, MD, 5 mg at 03/15/24 2151   insulin  aspart (novoLOG ) injection 0-9 Units, 0-9 Units, Subcutaneous, TID WC, Pokhrel, Laxman, MD   ketorolac  (TORADOL ) 15 MG/ML injection 15 mg, 15 mg, Intravenous, Once, Sundil, Subrina, MD   levETIRAcetam  (KEPPRA ) tablet 500 mg, 500 mg, Oral, BID, Rashid, Farhan, MD, 500 mg at 03/16/24 2112   melatonin tablet 6 mg, 6 mg, Oral, QHS  PRN, Segars, Jonathan, MD, 6 mg at 03/16/24 2111   ondansetron  (ZOFRAN ) tablet 4 mg, 4 mg, Oral, Q6H PRN **OR** ondansetron  (ZOFRAN ) injection 4 mg, 4 mg, Intravenous, Q6H PRN, Pokhrel, Laxman, MD   polyethylene glycol (MIRALAX  / GLYCOLAX ) packet 17 g, 17 g, Oral, Daily PRN, Pokhrel, Laxman, MD   sodium chloride  flush (NS) 0.9 % injection 3 mL, 3 mL, Intravenous, Q12H, Pokhrel, Laxman, MD, 3 mL at 03/16/24 2112   sodium chloride  flush (NS) 0.9 % injection 3 mL, 3 mL, Intravenous, PRN, Pokhrel, Laxman, MD   thiamine  (VITAMIN B1) tablet 100 mg, 100 mg, Oral, Daily, Rashid, Farhan, MD, 100 mg at 03/16/24 0844  Allergies: Allergies  Allergen Reactions   Tizanidine Other (See Comments)    dizziness   Vicodin [Hydrocodone -Acetaminophen ] Other (See Comments)    Can't take due to bariatric surgery    Penne Mori, DO

## 2024-03-17 NOTE — TOC Progression Note (Signed)
 Transition of Care [facility]) - Progression Note    Patient Details  Name: Micco Bourbeau MRN: [id number] Date of Birth: Oct 05, 1945  Transition of Care [facility] CM/SW Contact  Keyton Bhat A [place], LCSW Phone Number: 03/17/2024, 4:04 PM  Clinical Narrative:     CSW spoke with pt's brother about disposition plan. He said that his brother cannot come home until he is able to walk and better manage his ADL's. He said that pt is playing games and knows that he can't return home until he is walking better, as he and pt have discussed this at length.   Pt currently has no bed offers for SNF. CSW to follow up with pending facilities and see if they can possibly offer a bed.   Plan remains for SNF at [place].   CSW will continue to follow.   Expected Discharge Plan: Skilled Nursing Facility Barriers to Discharge: Insurance Authorization, SNF Pending bed offer, Continued Medical Work up               Expected Discharge Plan and Services       Living arrangements for the past 2 months: Apartment                                       Social Drivers of Health (SDOH) Interventions SDOH Screenings   Food Insecurity: No Food Insecurity (03/16/2024)  Housing: Unknown (03/16/2024)  Transportation Needs: No Transportation Needs (03/16/2024)  Utilities: Not At Risk (03/16/2024)  Social Connections: Socially Isolated (03/16/2024)  Tobacco Use: Low Risk  (03/14/2024)    Readmission Risk Interventions     No data to display

## 2024-03-17 NOTE — Progress Notes (Signed)
 This chaplain responded to PMT NP-Shae's consult for creating the Pt. Advance Directive and naming the Pt. brother-[name] Levora as HCPOA.  The Pt. participated in AD education and answered the chaplain's clarifying questions. The chaplain guided the Pt. in filling out HCPOA only and explained the process of notarizing the Pt. AD.  The Pt. accepted the chaplain's invitation to follow up with a notary on Friday.  Chaplain Leeroy Hummer [phone]

## 2024-03-17 NOTE — Progress Notes (Signed)
 PROGRESS NOTE    Terry Baxter  FMW:[id number] DOB: Dec 23, 1945 DOA: 03/14/2024 PCP: Andria Pipe, MD   Brief Narrative:  78 years old male with documented history of bipolar disorder, chronic back pain, hepatitis C and history of heroin abuse, history of TIA, hyperlipidemia, hypertension, seizure disorder and type 2 diabetes melitis presented with altered mental status; family member found him on the floor.  On presentation, CT of the head was unremarkable.  He could not tolerate MRI of brain.  Creatinine of 1.4, total CK at 1140 with WBC of 15.  Urine drug screen negative.  Chest x-ray without any infiltrate.  He was started on IV fluids.  Patient has refused multiple attempts at MRI.  EEG negative for seizures.  PT recommending SNF placement.  Assessment & Plan:   Acute metabolic encephalopathy: Present on admission -Likely multifactorial from volume depletion along with narcotic use - Imaging as above. -Monitor mental status -Fall precautions/seizure precautions. -PT recommending SNF placement.  TOC consulted - Ammonia, B12 and TSH levels normal -Patient has refused multiple attempts at MRI.  - EEG negative for seizures  Suicidal ideation -As per nursing staff, patient stated I want to die on 03/16/2024.  Psychiatry consult placed.  Continue sitter for now  Fall Elevated CK/mild rhabdomyolysis Generalized weakness/physical deconditioning - CK improving to 417 today.  Treated with IV fluids and subsequently discontinued.  Encourage oral intake. - Fall precautions.  Volume depletion/dehydration -Continue with IV fluids and discontinued  CKD stage IIIa - Creatinine currently at baseline.  History of seizure disorder - Continue Keppra   Diabetes mellitus type 2-A1c 5.3.  Continue CBGs with SSI.  Chronic back pain with narcotic dependence -Oxycodone  on hold for now.  Outpatient follow-up with PCP and/or pain management  Anxiety/bipolar disorder-supposed to be on  Wellbutrin , Klonopin  and risperidone  as an outpatient but patient does not take them.  Bilateral conjunctival injection: Possibility of conjunctivitis - Continue ciprofloxacin  eyedrops  Hypertension - Blood pressure currently stable.  Not on any antihypertensive currently  Obesity class I - Outpatient follow-up  Goals of care - Consult palliative care for goals of care discussion.  DVT prophylaxis: Lovenox  Code Status: Full Family Communication: None at bedside Disposition Plan: Status is: Inpatient Remains inpatient appropriate because: Of severity of illness    Consultants: Psychiatry Procedures: EEG  Antimicrobials: None   Subjective: Patient seen and examined at bedside.  Poor historian.  No fever, seizures, vomiting reported.  Sitter at bedside. Objective: Vitals:   03/16/24 1159 03/16/24 1657 03/16/24 2043 03/17/24 0518  BP: 116/74 113/63 93/72 114/67  Pulse: 63 66 63 64  Resp:   16 16  Temp:   98.8 F (37.1 C) 97.7 F (36.5 C)  TempSrc:   Oral Oral  SpO2: 98% 99% 100% 100%  Weight:    85.8 kg    Intake/Output Summary (Last 24 hours) at 03/17/2024 0737 Last data filed at 03/17/2024 9380 Gross per 24 hour  Intake 303 ml  Output 800 ml  Net -497 ml   Filed Weights   03/16/24 0445 03/17/24 0518  Weight: 86 kg 85.8 kg    Examination:  General: On room air.  No distress ENT/neck: No thyromegaly.  JVD is not elevated  respiratory: Decreased breath sounds at bases bilaterally with some crackles; no wheezing  CVS: S1-S2 heard, rate controlled currently Abdominal: Soft, nontender, slightly distended; no organomegaly, bowel sounds are heard Extremities: Trace lower extremity edema; no cyanosis  CNS: Awake; still very slow to respond and a poor historian.  Still confused time/current month.  No focal neurologic deficit.  Moves extremities Lymph: No obvious lymphadenopathy Skin: No obvious ecchymosis/lesions  psych: Extremely flat affect.  Currently not  agitated. musculoskeletal: No obvious joint swelling/deformity     Data Reviewed: I have personally reviewed following labs and imaging studies  CBC: Recent Labs  Lab 03/14/24 1205 03/15/24 0338 03/16/24 0148  WBC 15.0* 10.2 8.3  NEUTROABS  --   --  5.3  HGB 13.8 13.1 12.8*  HCT 42.7 38.6* 38.7*  MCV 96.4 91.9 93.5  PLT 170 170 166   Basic Metabolic Panel: Recent Labs  Lab 03/14/24 1205 03/15/24 0338 03/16/24 0148 03/17/24 0201  NA 137 138 137 135  K 4.3 3.8 4.3 3.9  CL 104 105 104 103  CO2 21* 23 24 22   GLUCOSE 109* 71 72 85  BUN 23 20 25* 26*  CREATININE 1.40* 1.27* 1.36* 1.35*  CALCIUM  9.3 8.8* 8.8* 8.5*  MG  --   --   --  2.1   GFR: Estimated Creatinine Clearance: 47.8 mL/min (A) (by C-G formula based on SCr of 1.35 mg/dL (H)). Liver Function Tests: Recent Labs  Lab 03/14/24 1205  AST 37  ALT 16  ALKPHOS 84  BILITOT 0.9  PROT 7.3  ALBUMIN 3.9   No results for input(s): LIPASE, AMYLASE in the last 168 hours. Recent Labs  Lab 03/14/24 1320  AMMONIA 17   Coagulation Profile: Recent Labs  Lab 03/15/24 0338  INR 1.1   Cardiac Enzymes: Recent Labs  Lab 03/14/24 1214 03/16/24 0148 03/17/24 0201  CKTOTAL 1,140* 820* 417*   BNP (last 3 results) No results for input(s): PROBNP in the last 8760 hours. HbA1C: Recent Labs    03/14/24 2038  HGBA1C 5.3   CBG: Recent Labs  Lab 03/15/24 2137 03/16/24 0808 03/16/24 1158 03/16/24 1657 03/16/24 2053  GLUCAP 93 81 74 82 78   Lipid Profile: Recent Labs    03/15/24 0338  CHOL 174  HDL 53  LDLCALC 110*  TRIG 54  CHOLHDL 3.3   Thyroid Function Tests: Recent Labs    03/14/24 2038  TSH 2.965   Anemia Panel: Recent Labs    03/14/24 2038  VITAMINB12 311   Sepsis Labs: No results for input(s): PROCALCITON, LATICACIDVEN in the last 168 hours.  Recent Results (from the past 240 hours)  Urine Culture     Status: None   Collection Time: 03/14/24  1:05 PM   Specimen:  In/Out Cath Urine  Result Value Ref Range Status   Specimen Description IN/OUT CATH URINE  Final   Special Requests NONE  Final   Culture   Final    NO GROWTH Performed at [facility] : [facility], [address], [place], [place] [address]    Report Status 03/15/2024 FINAL  Final         Radiology Studies: EEG adult Result Date: 03/16/2024 Shelton Arlin KIDD, MD     03/16/2024  3:56 PM Patient Name: Terry Baxter MRN: [id number] Epilepsy Attending: Arlin KIDD Shelton Referring Physician/Provider: Cheryle Page, MD Date: 03/16/2024 Duration: 22.24 mins Patient history: 78yo m with ams. EEG to evaluate for seizure Level of alertness: Awake AEDs during EEG study: LEV Technical aspects: This EEG study was done with scalp electrodes positioned according to the 10-20 International system of electrode placement. Electrical activity was reviewed with band pass filter of 1-70Hz , sensitivity of 7 uV/mm, display speed of 71mm/sec with a 60Hz  notched filter applied as appropriate. EEG data were recorded continuously  and digitally stored.  Video monitoring was available and reviewed as appropriate. Description: The posterior dominant rhythm consists of 9-10 Hz activity of moderate voltage (25-35 uV) seen predominantly in posterior head regions, symmetric and reactive to eye opening and eye closing. Drowsiness was characterized by attenuation of the posterior background rhythm. Sleep was characterized by vertex waves, maximal frontocentral region. Hyperventilation and photic stimulation were not performed.   IMPRESSION: This study is within normal limits. No seizures or epileptiform discharges were seen throughout the recording. A normal interictal EEG does not exclude the diagnosis of epilepsy. Priyanka O Yadav        Scheduled Meds:  ciprofloxacin   2 drop Both Eyes Q4H while awake   enoxaparin  (LOVENOX ) injection  40 mg Subcutaneous Q24H   folic acid   1 mg Oral Daily   insulin  aspart  0-9 Units Subcutaneous  TID WC   ketorolac   15 mg Intravenous Once   levETIRAcetam   500 mg Oral BID   sodium chloride  flush  3 mL Intravenous Q12H   thiamine   100 mg Oral Daily   Continuous Infusions:        Sophie Mao, MD [facility] 03/17/2024, 7:37 AM

## 2024-03-17 NOTE — Progress Notes (Signed)
 Mobility Specialist: Progress Note   03/17/24 0952  Mobility  Activity Refused and notified nurse if applicable  Mobility Specialist Start Time (ACUTE ONLY) 0846    Pt refused. Would not respond to greeting or any questions asked by MS. Only thing he stated was Would you just leave me alone. Would not participate or respond to anything else. Left in bed, sitter present. RN aware.   Ileana Lute Mobility Specialist Please contact via SecureChat or Rehab office at [phone]

## 2024-03-17 NOTE — Progress Notes (Signed)
 Physical Therapy Treatment Patient Details Name: Terry Baxter MRN: [id number] DOB: 1945/12/16 Today's Date: 03/17/2024   History of Present Illness Patient is a 78 y/o male admitted 03/14/24 with AMS and weakness after family found him no the floor.  CTH unremarkable and EEG pending.  Felt possibly due to volume depletion. Positive for mild rhabdomyolysis. Possible sx orthostatic hypotension 8/21 during PT session. PMH positive for seizure disorder, chronic back pain, bipolar disorder, HTN CKD II, HTN and Hep C.    PT Comments  Pt received in supine, A&O to self and location, partially to situation, expressing frustration due to continued hospitalization and desiring to go home. After lengthy discussion including pt goals as far as mobility and how this may help in a quicker discharge, pt agreeable to participate in therapy session. Pt performed bed mobility and step pivot transfer to chair, then requesting rest break due to fatigue. Pt participated in pre-gait standing marching exercise and reciprocal sit<>stand transfers from chair<>RW with light minA to CGA. Pt expressed motivation to progress strength, and also distrust in healthcare system. Palliative MD checking in during session, updated clinician on his progress at end of session.   If plan is discharge home, recommend the following: A lot of help with bathing/dressing/bathroom;A lot of help with walking and/or transfers;Supervision due to cognitive status;Assist for transportation;Assistance with cooking/housework   Can travel by private vehicle     No  Equipment Recommendations  Rolling walker (2 wheels)    Recommendations for Other Services       Precautions / Restrictions Precautions Precautions: Fall Recall of Precautions/Restrictions: Impaired Precaution/Restrictions Comments: seizure precs; sitter; possible orthostatic symptoms Restrictions Weight Bearing Restrictions Per Provider Order: No     Mobility  Bed  Mobility Overal bed mobility: Needs Assistance Bed Mobility: Supine to Sit, Sit to Supine     Supine to sit: Supervision Sit to supine: Supervision   General bed mobility comments: Extra time needed, but able to complete incremental scoots to [facility]    Transfers Overall transfer level: Needs assistance Equipment used: Rolling walker (2 wheels) Transfers: Sit to/from Stand, Bed to chair/wheelchair/BSC Sit to Stand: Min assist, Contact guard assist   Step pivot transfers: Contact guard assist       General transfer comment: EOB>RW and RW>chair, seated break, then STS x6 (5 reciprocal), all using arms to push up. Pt encouraged to attempt STS without using arms but refusing to attempt then states too fatigued for additional exercise/transfers.    Ambulation/Gait Ambulation/Gait assistance: Contact guard assist, Min assist   Assistive device: Rolling walker (2 wheels)       Pre-gait activities: hip flexion x20 reps at RW then requesting to sit General Gait Details: concern for orthostatic hypotension, pt defer ambulation away from chair after step pivot ~72ft and pre-gait activity, c/o fatigue   Stairs             Wheelchair Mobility     Tilt Bed    Modified Rankin (Stroke Patients Only)       Balance Overall balance assessment: Needs assistance Sitting-balance support: Bilateral upper extremity supported, Feet supported Sitting balance-Leahy Scale: Fair Sitting balance - Comments: did not challenge   Standing balance support: Bilateral upper extremity supported, Reliant on assistive device for balance Standing balance-Leahy Scale: Poor Standing balance comment: appears reliant on RW for weight shifting                            Communication  Communication Communication: No apparent difficulties  Cognition Arousal: Alert Behavior During Therapy: Flat affect, Agitated   PT - Cognitive impairments: No family/caregiver present to determine  baseline, Problem solving, Orientation, Safety/Judgement                         Following commands: Impaired Following commands impaired: Follows multi-step commands inconsistently, Follows one step commands with increased time Pt states Maybe I wanted to stay there when PTA discussed that he was found down at home and unable to get up. Pt also states I want to fly, and they are clipping my wings. 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 RN/care team notified.    Cueing Cueing Techniques: Verbal cues, Gestural cues, Tactile cues  Exercises Other Exercises Other Exercises: standing hip flexion x20 reps at rW Other Exercises: STS x 5 using arms    General Comments General comments (skin integrity, edema, etc.): BP 104/71 HR 77 bpm seated in chair; BP 90/62 (70) HR 94 bpm standing      Pertinent Vitals/Pain Pain Assessment Pain Assessment: PAINAD Breathing: normal Negative Vocalization: occasional moan/groan, low speech, negative/disapproving quality Facial Expression: smiling or inexpressive Body Language: relaxed Consolability: no need to console PAINAD Score: 1 Pain Location: no c/o but also states if I say anything, it will be held against me Pain Descriptors / Indicators: Grimacing Pain Intervention(s): Limited activity within patient's tolerance, Monitored during session, Repositioned    Home Living                          Prior Function            PT Goals (current goals can now be found in the care plan section) Acute Rehab PT Goals Patient Stated Goal: To get stronger so I can go home PT Goal Formulation: Patient unable to participate in goal setting Time For Goal Achievement: 03/29/24 Progress towards PT goals: Progressing toward goals    Frequency    Min 2X/week      PT Plan      Co-evaluation              AM-PAC PT 6 Clicks  Mobility   Outcome Measure  Help needed turning from your back to your side while in a flat bed without using bedrails?: A Little Help needed moving from lying on your back to sitting on the side of a flat bed without using bedrails?: A Little Help needed moving to and from a bed to a chair (including a wheelchair)?: A Little Help needed standing up from a chair using your arms (e.g., wheelchair or bedside chair)?: A Little Help needed to walk in hospital room?: Total Help needed climbing 3-5 steps with a railing? 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

## 2024-03-17 NOTE — Plan of Care (Signed)
 Patient is complaining about foot pain.  History of allergic to narcotics. Stable renal function.  Giving 1 dose of IV Toradol  15 mg.

## 2024-03-18 DIAGNOSIS — R4182 Altered mental status, unspecified: Secondary | ICD-10-CM | POA: Diagnosis not present

## 2024-03-18 LAB — CBC WITH DIFFERENTIAL/PLATELET
Abs Immature Granulocytes: 0.03 K/uL (ref 0.00–0.07)
Basophils Absolute: 0 K/uL (ref 0.0–0.1)
Basophils Relative: 1 %
Eosinophils Absolute: 0.2 K/uL (ref 0.0–0.5)
Eosinophils Relative: 2 %
HCT: 37.8 % — ABNORMAL LOW (ref 39.0–52.0)
Hemoglobin: 12.9 g/dL — ABNORMAL LOW (ref 13.0–17.0)
Immature Granulocytes: 0 %
Lymphocytes Relative: 23 %
Lymphs Abs: 1.7 K/uL (ref 0.7–4.0)
MCH: 30.8 pg (ref 26.0–34.0)
MCHC: 34.1 g/dL (ref 30.0–36.0)
MCV: 90.2 fL (ref 80.0–100.0)
Monocytes Absolute: 0.9 K/uL (ref 0.1–1.0)
Monocytes Relative: 12 %
Neutro Abs: 4.6 K/uL (ref 1.7–7.7)
Neutrophils Relative %: 62 %
Platelets: 173 K/uL (ref 150–400)
RBC: 4.19 MIL/uL — ABNORMAL LOW (ref 4.22–5.81)
RDW: 13 % (ref 11.5–15.5)
WBC: 7.4 K/uL (ref 4.0–10.5)
nRBC: 0 % (ref 0.0–0.2)

## 2024-03-18 LAB — COMPREHENSIVE METABOLIC PANEL WITH GFR
ALT: 13 U/L (ref 0–44)
AST: 31 U/L (ref 15–41)
Albumin: 3 g/dL — ABNORMAL LOW (ref 3.5–5.0)
Alkaline Phosphatase: 73 U/L (ref 38–126)
Anion gap: 12 (ref 5–15)
BUN: 20 mg/dL (ref 8–23)
CO2: 21 mmol/L — ABNORMAL LOW (ref 22–32)
Calcium: 8.6 mg/dL — ABNORMAL LOW (ref 8.9–10.3)
Chloride: 105 mmol/L (ref 98–111)
Creatinine, Ser: 1.27 mg/dL — ABNORMAL HIGH (ref 0.61–1.24)
GFR, Estimated: 59 mL/min — ABNORMAL LOW (ref >60)
Glucose, Bld: 84 mg/dL (ref 70–99)
Potassium: 3.5 mmol/L (ref 3.5–5.1)
Sodium: 138 mmol/L (ref 135–145)
Total Bilirubin: 1.2 mg/dL (ref 0.0–1.2)
Total Protein: 6.2 g/dL — ABNORMAL LOW (ref 6.5–8.1)

## 2024-03-18 LAB — MAGNESIUM: Magnesium: 1.9 mg/dL (ref 1.7–2.4)

## 2024-03-18 LAB — GLUCOSE, CAPILLARY
Glucose-Capillary: 86 mg/dL (ref 70–99)
Glucose-Capillary: 89 mg/dL (ref 70–99)
Glucose-Capillary: 82 mg/dL (ref 70–99)

## 2024-03-18 NOTE — Progress Notes (Signed)
 Physical Therapy Treatment Patient Details Name: Terry Baxter MRN: [id number] DOB: [birth year] Today's Date: 03/18/2024   History of Present Illness Patient is a 78 y/o male admitted 03/14/24 with AMS and weakness after family found him no the floor.  CTH unremarkable and EEG pending.  Felt possibly due to volume depletion. Positive for mild rhabdomyolysis. Possible sx orthostatic hypotension 8/21 during PT session. PMH positive for seizure disorder, chronic back pain, bipolar disorder, HTN CKD II, HTN and Hep C.    PT Comments  Patient is agreeable to PT session today. Patient walked several laps around the room with rolling walker for support. Encouraged patient to consider rolling walker for home use, but he declined. No dizziness reported with mobility. Increased activity tolerance this session. Anticipate patient would need initial caregiver support for safe transition to home. PT will continue to follow. Consider rehabilitation < 3 hours/day pending support at home.    If plan is discharge home, recommend the following: A lot of help with bathing/dressing/bathroom;A lot of help with walking and/or transfers;Supervision due to cognitive status;Assist for transportation;Assistance with cooking/housework   Can travel by private vehicle     No  Equipment Recommendations  Rolling walker (2 wheels)    Recommendations for Other Services       Precautions / Restrictions Precautions Precautions: Fall Recall of Precautions/Restrictions: Impaired Restrictions Weight Bearing Restrictions Per Provider Order: No     Mobility  Bed Mobility Overal bed mobility: Needs Assistance Bed Mobility: Supine to Sit, Sit to Supine     Supine to sit: Supervision Sit to supine: Modified independent (Device/Increase time)        Transfers Overall transfer level: Needs assistance Equipment used: Rolling walker (2 wheels) Transfers: Sit to/from Stand Sit to Stand: Supervision, From elevated  surface           General transfer comment: no physical assistance required for standing from elevated bed height    Ambulation/Gait Ambulation/Gait assistance: Contact guard assist Gait Distance (Feet): 75 Feet Assistive device: Rolling walker (2 wheels) Gait Pattern/deviations: Step-through pattern, Trunk flexed Gait velocity: decreased     General Gait Details: patient reports no dizziness with standing. he requestd to use the rolling walker for safety due to generalized weakness. patient walked several laps in the room with no overt loss of balance, occasional safety cues provided. encouraged patient to consider rolling walker for home use, but he declined   Stairs             Wheelchair Mobility     Tilt Bed    Modified Rankin (Stroke Patients Only)       Balance Overall balance assessment: Needs assistance Sitting-balance support: Feet supported, No upper extremity supported Sitting balance-Leahy Scale: Fair     Standing balance support: Bilateral upper extremity supported Standing balance-Leahy Scale: Poor Standing balance comment: heavy reliance on rolling walker for support in standing                            Communication Communication Communication: No apparent difficulties  Cognition Arousal: Alert Behavior During Therapy: WFL for tasks assessed/performed   PT - Cognitive impairments: No apparent impairments                       PT - Cognition Comments: patient is cooperative and participates with therapy without difficulty Following commands: Impaired Following commands impaired: Follows multi-step commands inconsistently, Follows one step commands with increased time  Cueing Cueing Techniques: Verbal cues, Gestural cues, Tactile cues  Exercises General Exercises - Lower Extremity Long Arc Quad: AROM, Strengthening, Both, 5 reps, Seated Hip Flexion/Marching: AROM, Strengthening, Both, Seated (x 3 reps  each) Other Exercises Other Exercises: encouraged LE exercises for strengthening    General Comments General comments (skin integrity, edema, etc.): encouraged patient to sit up for meals for upright conditioning.      Pertinent Vitals/Pain Pain Assessment Pain Assessment: No/denies pain    Home Living                          Prior Function            PT Goals (current goals can now be found in the care plan section) Acute Rehab PT Goals Patient Stated Goal: To get stronger so I can go home PT Goal Formulation: With patient Time For Goal Achievement: 03/29/24 Potential to Achieve Goals: Fair Progress towards PT goals: Progressing toward goals    Frequency    Min 2X/week      PT Plan      Co-evaluation              AM-PAC PT 6 Clicks Mobility   Outcome Measure  Help needed turning from your back to your side while in a flat bed without using bedrails?: None Help needed moving from lying on your back to sitting on the side of a flat bed without using bedrails?: A Little Help needed moving to and from a bed to a chair (including a wheelchair)?: A Little Help needed standing up from a chair using your arms (e.g., wheelchair or bedside chair)?: A Little Help needed to walk in hospital room?: A Little Help needed climbing 3-5 steps with a railing? : Total 6 Click Score: 17    End of Session   Activity Tolerance: Patient tolerated treatment well Patient left: in bed;with call bell/phone within reach;with bed alarm set Nurse Communication: Mobility status PT Visit Diagnosis: Other abnormalities of gait and mobility (R26.89);Pain     Time: 8888-8875 PT Time Calculation (min) (ACUTE ONLY): 13 min  Charges:    $Therapeutic Activity: 8-22 mins PT General Charges $$ ACUTE PT VISIT: 1 Visit                     Randine Essex, PT, MPT    Randine LULLA Essex 03/18/2024, 11:42 AM

## 2024-03-18 NOTE — Progress Notes (Signed)
 This chaplain is present to follow up on notarizing the Pt. Advance Directive, HCPOA only, the Pt. is not completing a Living Will. The Pt. is able to answer clarifying questions from Thursday's AD education with the chaplain.  The Pt. named Norleen Pines as his HCPOA. The chaplain is present with the Pt., notary, and witnesses for the notarizing of the Pt. AD, HCPOA only.  The chaplain gave the original AD to the Pt. along with one copy. The chaplain scanned the Pt. AD into the Pt. EMR.  The chaplain is available for F/U spiritual care as needed.  Chaplain Leeroy Hummer [phone]

## 2024-03-18 NOTE — Plan of Care (Signed)

## 2024-03-18 NOTE — Progress Notes (Signed)
 Occupational Therapy Treatment Patient Details Name: Terry Baxter MRN: [id number] DOB: Sep 28, 1945 Today's Date: 03/18/2024   History of present illness Patient is a 78 y/o male admitted 03/14/24 with AMS and weakness after family found him no the floor.  CTH unremarkable and EEG pending.  Felt possibly due to volume depletion. Positive for mild rhabdomyolysis. Possible sx orthostatic hypotension 8/21 during PT session. PMH positive for seizure disorder, chronic back pain, bipolar disorder, HTN CKD II, HTN and Hep C.   OT comments  Patient seen for further assessment of cognition, functional mobility, and ADL independence. Patient with notably improved mood and increased awareness, however continues to require cues for safety and walker management. Patient able to complete ADLs at [facility] at sink, but declined further ambulation. OT recommendation remains appropriate; OT will continue to follow.       If plan is discharge home, recommend the following:  A little help with walking and/or transfers;A little help with bathing/dressing/bathroom;Assistance with cooking/housework;Direct supervision/assist for medications management;Direct supervision/assist for financial management;Assist for transportation;Help with stairs or ramp for entrance;Supervision due to cognitive status   Equipment Recommendations  Other (comment) (defer to next venue)    Recommendations for Other Services      Precautions / Restrictions Precautions Precautions: Fall Recall of Precautions/Restrictions: Impaired Restrictions Weight Bearing Restrictions Per Provider Order: No       Mobility Bed Mobility Overal bed mobility: Needs Assistance Bed Mobility: Supine to Sit, Sit to Supine     Supine to sit: Supervision Sit to supine: Modified independent (Device/Increase time)        Transfers Overall transfer level: Needs assistance Equipment used: Rolling walker (2 wheels) Transfers: Sit to/from Stand Sit to  Stand: Supervision, From elevated surface           General transfer comment: no physical assistance required for standing from elevated bed height     Balance Overall balance assessment: Needs assistance Sitting-balance support: Feet supported, No upper extremity supported Sitting balance-Leahy Scale: Fair     Standing balance support: Bilateral upper extremity supported Standing balance-Leahy Scale: Poor Standing balance comment: heavy reliance on rolling walker for support in standing                           ADL either performed or assessed with clinical judgement   ADL Overall ADL's : Needs assistance/impaired     Grooming: Wash/dry hands;Wash/dry face;Set up;Standing                   Toilet Transfer: Contact guard assist;Ambulation;Rolling walker (2 wheels)           Functional mobility during ADLs: Contact guard assist;Cueing for safety;Cueing for sequencing General ADL Comments: Patient seen for further assessment of cognition, functional mobility, and ADL independence. Patient with notably improved mood and increased awareness, however continues to require cues for safety and walker management. Patient able to complete ADLs at [facility] at sink, but declined further ambulation. OT recommendation remains appropriate; OT will continue to follow.    Extremity/Trunk Assessment              Vision       Restaurant manager, fast food Communication: No apparent difficulties   Cognition Arousal: Alert Behavior During Therapy: WFL for tasks assessed/performed Cognition: Cognition impaired     Awareness: Intellectual awareness impaired, Online awareness impaired Memory impairment (select all impairments): Short-term memory, Working [employer] fast streamer, Non-declarative long-term memory,  Declarative long-term memory Attention impairment (select first level of impairment): Sustained attention   OT - Cognition Comments: Significant  improvement in cognition this date                 Following commands: Impaired Following commands impaired: Follows multi-step commands inconsistently, Follows one step commands with increased time      Cueing   Cueing Techniques: Verbal cues, Gestural cues, Tactile cues  Exercises General Exercises - Lower Extremity Hip Flexion/Marching:  (x 3 reps each)    Shoulder Instructions       General Comments encouraged patient to sit up for meals for upright conditioning.    Pertinent Vitals/ Pain       Pain Assessment Pain Assessment: No/denies pain  Home Living                                          Prior Functioning/Environment              Frequency  Min 2X/week        Progress Toward Goals  OT Goals(current goals can now be found in the care plan section)  Progress towards OT goals: Progressing toward goals  Acute Rehab OT Goals Patient Stated Goal: to get better OT Goal Formulation: With patient Time For Goal Achievement: 03/30/24 Potential to Achieve Goals: Good  Plan      Co-evaluation                 AM-PAC OT 6 Clicks Daily Activity     Outcome Measure   Help from another person eating meals?: A Little Help from another person taking care of personal grooming?: A Little Help from another person toileting, which includes using toliet, bedpan, or urinal?: A Little Help from another person bathing (including washing, rinsing, drying)?: A Little Help from another person to put on and taking off regular upper body clothing?: A Little Help from another person to put on and taking off regular lower body clothing?: A Little 6 Click Score: 18    End of Session Equipment Utilized During Treatment: Gait belt;Rolling walker (2 wheels)  OT Visit Diagnosis: Unsteadiness on feet (R26.81);Other abnormalities of gait and mobility (R26.89);Repeated falls (R29.6);History of falling (Z91.81);Muscle weakness (generalized)  (M62.81);Other symptoms and signs involving cognitive function;Pain   Activity Tolerance Patient tolerated treatment well   Patient Left in bed;with call bell/phone within reach;with bed alarm set   Nurse Communication Mobility status        Time: 8861-8851 OT Time Calculation (min): 10 min  Charges: OT General Charges $OT Visit: 1 Visit OT Treatments $Self Care/Home Management : 8-22 mins  Ronal Gift E. Ellon Marasco, OTR/L Acute Rehabilitation Services [phone]   Ronal Gift Salt 03/18/2024, 2:16 PM

## 2024-03-18 NOTE — TOC Progression Note (Signed)
 Transition of Care [facility]) - Progression Note    Patient Details  Name: Terry Baxter MRN: [id number] Date of Birth: 06/08/1946  [facility] CM/SW Contact  Senya Hinzman A [place], LCSW Phone Number: 03/18/2024, 9:12 AM  Clinical Narrative:     CSW spoke with pt's brother Norleen. He stated that he would be out of town and back Sunday if pt was to discharge home, he would have no support until then.   CSW stated plan for rehab was still being coordinated, though no current bed offers at this time. CSW still waiting to hear back from pending facilities.   CSW to follow up with pt and continue to progress disposition plan.   CSW will continue to follow.   Expected Discharge Plan: Skilled Nursing Facility Barriers to Discharge: Insurance Authorization, SNF Pending bed offer, Continued Medical Work up               Expected Discharge Plan and Services       Living arrangements for the past 2 months: Apartment                                       Social Drivers of Health (SDOH) Interventions SDOH Screenings   Food Insecurity: No Food Insecurity (03/16/2024)  Housing: Unknown (03/16/2024)  Transportation Needs: No Transportation Needs (03/16/2024)  Utilities: Not At Risk (03/16/2024)  Social Connections: Socially Isolated (03/16/2024)  Tobacco Use: Low Risk  (03/14/2024)    Readmission Risk Interventions     No data to display

## 2024-03-18 NOTE — Progress Notes (Signed)
 PROGRESS NOTE    Terry Baxter  FMW:[id number] DOB: [birth year] DOA: 03/14/2024 PCP: Andria Pipe, MD   Brief Narrative:  78 years old male with documented history of bipolar disorder, chronic back pain, hepatitis C and history of heroin abuse, history of TIA, hyperlipidemia, hypertension, seizure disorder and type 2 diabetes melitis presented with altered mental status; family member found him on the floor.  On presentation, CT of the head was unremarkable.  He could not tolerate MRI of brain.  Creatinine of 1.4, total CK at 1140 with WBC of 15.  Urine drug screen negative.  Chest x-ray without any infiltrate.  He was started on IV fluids.  Patient has refused multiple attempts at MRI.  EEG negative for seizures.  PT recommending SNF placement.  TOC consulted.  Psychiatry consulted for possible suicidal ideation: Psychiatry evaluated the patient on 03/17/2024 who thought that patient was not suicidal and signed off.  Assessment & Plan:   Acute metabolic encephalopathy: Present on admission -Likely multifactorial from volume depletion along with narcotic use - Imaging as above. -Monitor mental status -Fall precautions/seizure precautions. -PT recommending SNF placement.  TOC following. - Ammonia, B12 and TSH levels normal -Patient has refused multiple attempts at MRI: MRI order was subsequently discontinued on 03/17/2024.  - EEG negative for seizures - Patient refusing care intermittently including medications.  As per TOC: Brother wants patient placed in SNF although patient does not want to go to SNF.  Suicidal ideation -As per nursing staff, patient stated I want to die on 03/16/2024. Psychiatry evaluated the patient on 03/17/2024 who thought that patient was not suicidal and signed off.  Fall Elevated CK/mild rhabdomyolysis Generalized weakness/physical deconditioning - CK improving to 417 on 03/17/2024.  Treated with IV fluids and subsequently discontinued.  Encourage oral intake. -  Fall precautions.  Volume depletion/dehydration - Treated with IV fluids and discontinued  CKD stage IIIa - Creatinine currently at baseline.  Acute metabolic acidosis - Mild.  Monitor intermittently.  History of seizure disorder - Continue Keppra   Diabetes mellitus type 2-A1c 5.3.  Continue CBGs with SSI.  Chronic back pain with narcotic dependence -Oxycodone  on hold for now.  Outpatient follow-up with PCP and/or pain management  Anxiety/bipolar disorder-supposed to be on Wellbutrin , Klonopin  and risperidone  as an outpatient but patient does not take them.  Bilateral conjunctival injection: Possibility of conjunctivitis - Continue ciprofloxacin  eyedrops  Hypertension - Blood pressure currently stable.  Not on any antihypertensive currently  Obesity class I - Outpatient follow-up  Goals of care - Palliative care evaluation appreciated.  CODE STATUS changed to DNR by palliative care team.  DVT prophylaxis: Lovenox  Code Status: DNR Family Communication: None at bedside Disposition Plan: Status is: Inpatient Remains inpatient appropriate because: Of severity of illness    Consultants: Psychiatry Procedures: EEG  Antimicrobials: None   Subjective: Patient seen and examined at bedside.  Poor historian.  Patient was agitated overnight requiring Haldol  as per nursing staff.  No fever, vomiting, abdominal pain reported.   Objective: Vitals:   03/17/24 1640 03/17/24 1953 03/17/24 2355 03/18/24 0356  BP: 116/70 121/67 (!) 103/54 113/60  Pulse: 62 (!) 57 70 71  Resp:  16 16 16   Temp:  98.5 F (36.9 C) 98.5 F (36.9 C) 98.5 F (36.9 C)  TempSrc:  Oral Oral Oral  SpO2: 99% 99% 97% 96%  Weight:       No intake or output data in the 24 hours ending 03/18/24 0737  Filed Weights   03/16/24 0445 03/17/24  0518  Weight: 86 kg 85.8 kg    Examination:  General: No acute distress.  Remains on room air.  Chronically ill and deconditioned.   ENT/neck: No palpable neck  masses or JVD elevation noted  respiratory: Bilateral decreased breath sounds at bases with scattered crackles CVS: S1-S2 heard, rate mostly controlled Abdominal: Soft, nontender, distended mildly; no organomegaly, bowel sounds are heard normally Extremities: No clubbing; mild lower extremity edema present CNS: Still extremely slow to respond and a very poor historian.  Remains slightly confused with current month.  No focal neurologic deficit.  Able to move extremities Lymph: No obvious palpable lymphadenopathy Skin: No obvious petechia/rashes  psych: No signs of agitation currently.  Mostly flat affect.  Looks thrombospondin. musculoskeletal: No obvious joint erythema/tenderness.    Data Reviewed: I have personally reviewed following labs and imaging studies  CBC: Recent Labs  Lab 03/14/24 1205 03/15/24 0338 03/16/24 0148 03/18/24 0200  WBC 15.0* 10.2 8.3 7.4  NEUTROABS  --   --  5.3 4.6  HGB 13.8 13.1 12.8* 12.9*  HCT 42.7 38.6* 38.7* 37.8*  MCV 96.4 91.9 93.5 90.2  PLT 170 170 166 173   Basic Metabolic Panel: Recent Labs  Lab 03/14/24 1205 03/15/24 0338 03/16/24 0148 03/17/24 0201 03/18/24 0200  NA 137 138 137 135 138  K 4.3 3.8 4.3 3.9 3.5  CL 104 105 104 103 105  CO2 21* 23 24 22  21*  GLUCOSE 109* 71 72 85 84  BUN 23 20 25* 26* 20  CREATININE 1.40* 1.27* 1.36* 1.35* 1.27*  CALCIUM  9.3 8.8* 8.8* 8.5* 8.6*  MG  --   --   --  2.1 1.9   GFR: Estimated Creatinine Clearance: 50.8 mL/min (A) (by C-G formula based on SCr of 1.27 mg/dL (H)). Liver Function Tests: Recent Labs  Lab 03/14/24 1205 03/18/24 0200  AST 37 31  ALT 16 13  ALKPHOS 84 73  BILITOT 0.9 1.2  PROT 7.3 6.2*  ALBUMIN 3.9 3.0*   No results for input(s): LIPASE, AMYLASE in the last 168 hours. Recent Labs  Lab 03/14/24 1320  AMMONIA 17   Coagulation Profile: Recent Labs  Lab 03/15/24 0338  INR 1.1   Cardiac Enzymes: Recent Labs  Lab 03/14/24 1214 03/16/24 0148 03/17/24 0201   CKTOTAL 1,140* 820* 417*   BNP (last 3 results) No results for input(s): PROBNP in the last 8760 hours. HbA1C: No results for input(s): HGBA1C in the last 72 hours.  CBG: Recent Labs  Lab 03/16/24 1657 03/16/24 2053 03/17/24 0759 03/17/24 1306 03/17/24 1640  GLUCAP 82 78 86 97 82   Lipid Profile: No results for input(s): CHOL, HDL, LDLCALC, TRIG, CHOLHDL, LDLDIRECT in the last 72 hours.  Thyroid Function Tests: No results for input(s): TSH, T4TOTAL, FREET4, T3FREE, THYROIDAB in the last 72 hours.  Anemia Panel: No results for input(s): VITAMINB12, FOLATE, FERRITIN, TIBC, IRON, RETICCTPCT in the last 72 hours.  Sepsis Labs: No results for input(s): PROCALCITON, LATICACIDVEN in the last 168 hours.  Recent Results (from the past 240 hours)  Urine Culture     Status: None   Collection Time: 03/14/24  1:05 PM   Specimen: In/Out Cath Urine  Result Value Ref Range Status   Specimen Description IN/OUT CATH URINE  Final   Special Requests NONE  Final   Culture   Final    NO GROWTH Performed at [facility], [address], [place], [place] [address]    Report Status 03/15/2024 FINAL  Final         Radiology Studies: EEG adult Result Date: 03/16/2024 Shelton Arlin KIDD, MD     03/16/2024  3:56 PM Patient Name: Terry Baxter MRN: [id number] Epilepsy Attending: Arlin KIDD Shelton Referring Physician/Provider: Cheryle Page, MD Date: 03/16/2024 Duration: 22.24 mins Patient history: 78yo m with ams. EEG to evaluate for seizure Level of alertness: Awake AEDs during EEG study: LEV Technical aspects: This EEG study was done with scalp electrodes positioned according to the 10-20 International system of electrode placement. Electrical activity was reviewed with band pass filter of 1-70Hz , sensitivity of 7 uV/mm, display speed of 74mm/sec with a 60Hz  notched filter applied as appropriate. EEG data were recorded continuously and digitally  stored.  Video monitoring was available and reviewed as appropriate. Description: The posterior dominant rhythm consists of 9-10 Hz activity of moderate voltage (25-35 uV) seen predominantly in posterior head regions, symmetric and reactive to eye opening and eye closing. Drowsiness was characterized by attenuation of the posterior background rhythm. Sleep was characterized by vertex waves, maximal frontocentral region. Hyperventilation and photic stimulation were not performed.   IMPRESSION: This study is within normal limits. No seizures or epileptiform discharges were seen throughout the recording. A normal interictal EEG does not exclude the diagnosis of epilepsy. Priyanka O Yadav        Scheduled Meds:  ciprofloxacin   2 drop Both Eyes Q4H while awake   enoxaparin  (LOVENOX ) injection  40 mg Subcutaneous Q24H   folic acid   1 mg Oral Daily   insulin  aspart  0-9 Units Subcutaneous TID WC   ketorolac   15 mg Intravenous Once   levETIRAcetam   500 mg Oral BID   methylPREDNISolone  (SOLU-MEDROL ) injection  125 mg Intravenous Once   sodium chloride  flush  3 mL Intravenous Q12H   thiamine   100 mg Oral Daily   Continuous Infusions:        Page Cheryle, MD [facility] 03/18/2024, 7:37 AM

## 2024-03-18 NOTE — TOC Progression Note (Addendum)
 Transition of Care [facility]) - Progression Note    Patient Details  Name: Terry Baxter MRN: [id number] Date of Birth: 11/17/1945  [facility]) CM/SW Contact  [name] A [place], LCSW Phone Number: 03/18/2024, 4:19 PM  Clinical Narrative:     CSW met with pt at bedside. He is agreeable for short term rehab. CSW provided updated bed offers to pt at bedside with Medicare.gov ratings. Pt chose bed [facility] for rehab. Waiting to hear back when bed is available. CSW to complete authorization request.   CSW will continue to follow.  Expected Discharge Plan: Skilled Nursing Facility Barriers to Discharge: Insurance Authorization, SNF Pending bed offer, Continued Medical Work up               Expected Discharge Plan and Services       Living arrangements for the past 2 months: Apartment                                       Social Drivers of Health (SDOH) Interventions SDOH Screenings   Food Insecurity: No Food Insecurity (03/16/2024)  Housing: Unknown (03/16/2024)  Transportation Needs: No Transportation Needs (03/16/2024)  Utilities: Not At Risk (03/16/2024)  Social Connections: Socially Isolated (03/16/2024)  Tobacco Use: Low Risk  (03/14/2024)    Readmission Risk Interventions     No data to display

## 2024-03-19 DIAGNOSIS — R4182 Altered mental status, unspecified: Secondary | ICD-10-CM | POA: Diagnosis not present

## 2024-03-19 LAB — GLUCOSE, CAPILLARY
Glucose-Capillary: 108 mg/dL — ABNORMAL HIGH (ref 70–99)
Glucose-Capillary: 88 mg/dL (ref 70–99)
Glucose-Capillary: 99 mg/dL (ref 70–99)

## 2024-03-19 NOTE — Progress Notes (Signed)
 Mobility Specialist Progress Note:   03/19/24 1014  Mobility  Activity Ambulated with assistance (In hallway)  Level of Assistance Contact guard assist, steadying assist  Assistive Device Front wheel walker  Distance Ambulated (ft) 125 ft (*2)  Activity Response Tolerated well  Mobility Referral Yes  Mobility visit 1 Mobility  Mobility Specialist Start Time (ACUTE ONLY) 0954  Mobility Specialist Stop Time (ACUTE ONLY) 1004  Mobility Specialist Time Calculation (min) (ACUTE ONLY) 10 min   Received pt in bed and agreeable to mobility. No physical assistance needed. No c/o. Returned to room without fault. Left pt in bed with alarm on. Personal belongings and call light within reach. All needs met. RN present.  Terry Baxter Mobility Specialist  Please contact via [employer] or  Rehab Office [phone]

## 2024-03-19 NOTE — Plan of Care (Signed)
  Problem: Nutritional: Goal: Maintenance of adequate nutrition will improve Outcome: Progressing Goal: Progress toward achieving an optimal weight will improve Outcome: Progressing   Problem: Metabolic: Goal: Ability to maintain appropriate glucose levels will improve Outcome: Adequate for Discharge

## 2024-03-19 NOTE — Progress Notes (Signed)
 Patient who was received from outgoing staff alert and stable on room air. Patient is not in distress and his vitals have been stable throughout the day. Patient occasionally refuses some of his care and procedures. He has affect issues with fluctuating mood due to his bipolar disorder. Patient was evaluated today and considered currently medically stable for discharge to SNF. Patient care to continue per plan and recommendations.

## 2024-03-19 NOTE — Progress Notes (Signed)
 PROGRESS NOTE    Terry Baxter  FMW:[id number] DOB: April 05, 1946 DOA: 03/14/2024 PCP: Andria Pipe, MD   Brief Narrative:  78 years old male with documented history of bipolar disorder, chronic back pain, hepatitis C and history of heroin abuse, history of TIA, hyperlipidemia, hypertension, seizure disorder and type 2 diabetes melitis presented with altered mental status; family member found him on the floor.  On presentation, CT of the head was unremarkable.  He could not tolerate MRI of brain.  Creatinine of 1.4, total CK at 1140 with WBC of 15.  Urine drug screen negative.  Chest x-ray without any infiltrate.  He was started on IV fluids.  Patient has refused multiple attempts at MRI.  EEG negative for seizures.  PT recommending SNF placement.  TOC consulted.  Psychiatry consulted for possible suicidal ideation: Psychiatry evaluated the patient on 03/17/2024 who thought that patient was not suicidal and signed off.  Currently medically stable for discharge to SNF.  Assessment & Plan:   Acute metabolic encephalopathy: Present on admission -Likely multifactorial from volume depletion along with narcotic use - Imaging as above. -Monitor mental status.  Still slightly slow to respond. -Fall precautions/seizure precautions. -PT recommending SNF placement.  TOC following. - Ammonia, B12 and TSH levels normal -Patient has refused multiple attempts at MRI: MRI order was subsequently discontinued on 03/17/2024.  - EEG negative for seizures - Currently medically stable for discharge to SNF.  Suicidal ideation -As per nursing staff, patient stated I want to die on 03/16/2024. Psychiatry evaluated the patient on 03/17/2024 who thought that patient was not suicidal and signed off.  Fall Elevated CK/mild rhabdomyolysis Generalized weakness/physical deconditioning - CK improving to 417 on 03/17/2024.  Treated with IV fluids and subsequently discontinued.  Encourage oral intake. - Fall  precautions.  Volume depletion/dehydration - Treated with IV fluids and discontinued  CKD stage IIIa - Creatinine currently at baseline.  Acute metabolic acidosis - Mild.  Monitor intermittently.  History of seizure disorder - Continue Keppra   Diabetes mellitus type 2-A1c 5.3.  Continue CBGs with SSI.  Chronic back pain with narcotic dependence -Oxycodone  on hold for now.  Outpatient follow-up with PCP and/or pain management  Anxiety/bipolar disorder-supposed to be on Wellbutrin , Klonopin  and risperidone  as an outpatient but patient does not take them.  Bilateral conjunctival injection: Possibility of conjunctivitis - Continue ciprofloxacin  eyedrops  Hypertension - Blood pressure currently stable.  Not on any antihypertensive currently  Obesity class I - Outpatient follow-up  Goals of care - Palliative care evaluation appreciated.  CODE STATUS changed to DNR by palliative care team.  DVT prophylaxis: Lovenox  Code Status: DNR Family Communication: None at bedside Disposition Plan: Status is: Inpatient Remains inpatient appropriate because: Of severity of illness.  Need for SNF placement.  Currently medically stable for discharge to SNF.    Consultants: Psychiatry Procedures: EEG  Antimicrobials: None   Subjective: Patient seen and examined at bedside.  Poor historian.  No seizures, fever, agitation reported by nursing staff.   Objective: Vitals:   03/18/24 1151 03/18/24 1642 03/18/24 2352 03/19/24 0420  BP: 137/79 128/68 131/67 99/78  Pulse: 65 73 73 78  Resp: 19 18 16 16   Temp: 99 F (37.2 C) 98.2 F (36.8 C) 98.5 F (36.9 C) 98.5 F (36.9 C)  TempSrc:      SpO2: 100% 100% 100% 99%  Weight:        Intake/Output Summary (Last 24 hours) at 03/19/2024 0751 Last data filed at 03/19/2024 0700 Gross per 24 hour  Intake --  Output 2100 ml  Net -2100 ml    Filed Weights   03/16/24 0445 03/17/24 0518  Weight: 86 kg 85.8 kg     Examination:  General: Currently on room air and in no distress.  Chronically ill and deconditioned.   ENT/neck: No obvious thyromegaly or elevated JVD noted  respiratory: Decreased breath sounds at bases bilaterally with some crackles CVS: Rate currently controlled; S1 and S2 are heard  abdominal: Soft, nontender, slightly; no organomegaly, bowel sounds are heard extremities: Trace lower extremity edema; no cyanosis CNS: Very slow to respond; poor historian.  No obvious focal deficits  lymph: No palpable lymphadenopathy Skin: No obvious lesions/ecchymosis psych: Affect is mostly flat with no signs of agitation musculoskeletal: No obvious joint swelling/deformity   Data Reviewed: I have personally reviewed following labs and imaging studies  CBC: Recent Labs  Lab 03/14/24 1205 03/15/24 0338 03/16/24 0148 03/18/24 0200  WBC 15.0* 10.2 8.3 7.4  NEUTROABS  --   --  5.3 4.6  HGB 13.8 13.1 12.8* 12.9*  HCT 42.7 38.6* 38.7* 37.8*  MCV 96.4 91.9 93.5 90.2  PLT 170 170 166 173   Basic Metabolic Panel: Recent Labs  Lab 03/14/24 1205 03/15/24 0338 03/16/24 0148 03/17/24 0201 03/18/24 0200  NA 137 138 137 135 138  K 4.3 3.8 4.3 3.9 3.5  CL 104 105 104 103 105  CO2 21* 23 24 22  21*  GLUCOSE 109* 71 72 85 84  BUN 23 20 25* 26* 20  CREATININE 1.40* 1.27* 1.36* 1.35* 1.27*  CALCIUM  9.3 8.8* 8.8* 8.5* 8.6*  MG  --   --   --  2.1 1.9   GFR: Estimated Creatinine Clearance: 50.8 mL/min (A) (by C-G formula based on SCr of 1.27 mg/dL (H)). Liver Function Tests: Recent Labs  Lab 03/14/24 1205 03/18/24 0200  AST 37 31  ALT 16 13  ALKPHOS 84 73  BILITOT 0.9 1.2  PROT 7.3 6.2*  ALBUMIN 3.9 3.0*   No results for input(s): LIPASE, AMYLASE in the last 168 hours. Recent Labs  Lab 03/14/24 1320  AMMONIA 17   Coagulation Profile: Recent Labs  Lab 03/15/24 0338  INR 1.1   Cardiac Enzymes: Recent Labs  Lab 03/14/24 1214 03/16/24 0148 03/17/24 0201  CKTOTAL  1,140* 820* 417*   BNP (last 3 results) No results for input(s): PROBNP in the last 8760 hours. HbA1C: No results for input(s): HGBA1C in the last 72 hours.  CBG: Recent Labs  Lab 03/17/24 1306 03/17/24 1640 03/18/24 0819 03/18/24 1152 03/18/24 1643  GLUCAP 97 82 86 89 82   Lipid Profile: No results for input(s): CHOL, HDL, LDLCALC, TRIG, CHOLHDL, LDLDIRECT in the last 72 hours.  Thyroid Function Tests: No results for input(s): TSH, T4TOTAL, FREET4, T3FREE, THYROIDAB in the last 72 hours.  Anemia Panel: No results for input(s): VITAMINB12, FOLATE, FERRITIN, TIBC, IRON, RETICCTPCT in the last 72 hours.  Sepsis Labs: No results for input(s): PROCALCITON, LATICACIDVEN in the last 168 hours.  Recent Results (from the past 240 hours)  Urine Culture     Status: None   Collection Time: 03/14/24  1:05 PM   Specimen: In/Out Cath Urine  Result Value Ref Range Status   Specimen Description IN/OUT CATH URINE  Final   Special Requests NONE  Final   Culture   Final    NO GROWTH Performed at [facility], [address], [place], [place] [address]    Report Status 03/15/2024 FINAL  Final  Radiology Studies: No results found.       Scheduled Meds:  ciprofloxacin   2 drop Both Eyes Q4H while awake   enoxaparin  (LOVENOX ) injection  40 mg Subcutaneous Q24H   folic acid   1 mg Oral Daily   insulin  aspart  0-9 Units Subcutaneous TID WC   ketorolac   15 mg Intravenous Once   levETIRAcetam   500 mg Oral BID   methylPREDNISolone  (SOLU-MEDROL ) injection  125 mg Intravenous Once   sodium chloride  flush  3 mL Intravenous Q12H   thiamine   100 mg Oral Daily   Continuous Infusions:        Sophie Mao, MD [facility] 03/19/2024, 7:51 AM

## 2024-03-19 NOTE — TOC Progression Note (Signed)
 Transition of Care [facility]) - Progression Note    Patient Details  Name: Terry Baxter MRN: [id number] Date of Birth: September 21, 1945  Transition of [facility]) CM/SW Contact  Gwenn Frieze [place], [place] Phone Number: 03/19/2024, 12:41 PM  Clinical Narrative: CMA initiated [name] barrows request for [facility], Reference #[id number]. Will provide updates as available.   Frieze Gwenn, MSW, LCSW [phone] (coverage)        Expected Discharge Plan: Skilled Nursing Facility Barriers to Discharge: Insurance Authorization, SNF Pending bed offer, Continued Medical Work up               Expected Discharge Plan and Services       Living arrangements for the past 2 months: Apartment                                       Social Drivers of Health (SDOH) Interventions SDOH Screenings   Food Insecurity: No Food Insecurity (03/16/2024)  Housing: Unknown (03/16/2024)  Transportation Needs: No Transportation Needs (03/16/2024)  Utilities: Not At Risk (03/16/2024)  Social Connections: Socially Isolated (03/16/2024)  Tobacco Use: Low Risk  (03/14/2024)    Readmission Risk Interventions     No data to display

## 2024-03-19 NOTE — Plan of Care (Signed)
  Problem: Education: Goal: Ability to describe self-care measures that may prevent or decrease complications (Diabetes Survival Skills Education) will improve Outcome: Progressing   Problem: Coping: Goal: Ability to adjust to condition or change in health will improve Outcome: Progressing   Problem: Fluid Volume: Goal: Ability to maintain a balanced intake and output will improve Outcome: Progressing   Problem: Health Behavior/Discharge Planning: Goal: Ability to identify and utilize available resources and services will improve Outcome: Progressing Goal: Ability to manage health-related needs will improve Outcome: Progressing   Problem: Metabolic: Goal: Ability to maintain appropriate glucose levels will improve Outcome: Progressing   Problem: Nutritional: Goal: Maintenance of adequate nutrition will improve Outcome: Progressing   Problem: Skin Integrity: Goal: Risk for impaired skin integrity will decrease Outcome: Progressing   Problem: Tissue Perfusion: Goal: Adequacy of tissue perfusion will improve Outcome: Progressing

## 2024-03-20 DIAGNOSIS — R4182 Altered mental status, unspecified: Secondary | ICD-10-CM | POA: Diagnosis not present

## 2024-03-20 LAB — GLUCOSE, CAPILLARY
Glucose-Capillary: 101 mg/dL — ABNORMAL HIGH (ref 70–99)
Glucose-Capillary: 130 mg/dL — ABNORMAL HIGH (ref 70–99)

## 2024-03-20 NOTE — Progress Notes (Signed)
 PROGRESS NOTE    Terry Baxter  FMW:[id number] DOB: [birth year] DOA: 03/14/2024 PCP: Andria Pipe, MD   Brief Narrative:  78 years old male with documented history of bipolar disorder, chronic back pain, hepatitis C and history of heroin abuse, history of TIA, hyperlipidemia, hypertension, seizure disorder and type 2 diabetes melitis presented with altered mental status; family member found him on the floor.  On presentation, CT of the head was unremarkable.  He could not tolerate MRI of brain.  Creatinine of 1.4, total CK at 1140 with WBC of 15.  Urine drug screen negative.  Chest x-ray without any infiltrate.  He was started on IV fluids.  Patient has refused multiple attempts at MRI.  EEG negative for seizures.  PT recommending SNF placement.  TOC consulted.  Psychiatry consulted for possible suicidal ideation: Psychiatry evaluated the patient on 03/17/2024 who thought that patient was not suicidal and signed off.  Currently medically stable for discharge to SNF.  Assessment & Plan:   Acute metabolic encephalopathy: Present on admission -Likely multifactorial from volume depletion along with narcotic use - Imaging as above. -Monitor mental status.  Still slightly slow to respond. -Fall precautions/seizure precautions. -PT recommending SNF placement.  TOC following. - Ammonia, B12 and TSH levels normal -Patient has refused multiple attempts at MRI: MRI order was subsequently discontinued on 03/17/2024.  - EEG negative for seizures - Currently medically stable for discharge to SNF.  Suicidal ideation -As per nursing staff, patient stated I want to die on 03/16/2024. Psychiatry evaluated the patient on 03/17/2024 who thought that patient was not suicidal and signed off.  Fall Elevated CK/mild rhabdomyolysis Generalized weakness/physical deconditioning - CK improving to 417 on 03/17/2024.  Treated with IV fluids and subsequently discontinued.  Encourage oral intake. - Fall  precautions.  Volume depletion/dehydration - Treated with IV fluids and discontinued  CKD stage IIIa - Creatinine currently at baseline.  Acute metabolic acidosis - Mild.  Monitor intermittently.  History of seizure disorder - Continue Keppra   Diabetes mellitus type 2-A1c 5.3.  Continue CBGs with SSI.  Chronic back pain with narcotic dependence -Oxycodone  on hold for now.  Outpatient follow-up with PCP and/or pain management  Anxiety/bipolar disorder-supposed to be on Wellbutrin , Klonopin  and risperidone  as an outpatient but patient does not take them.  Bilateral conjunctival injection: Possibility of conjunctivitis - Improved.  DC Cipro  eyedrops.  Hypertension - Blood pressure currently stable.  Not on any antihypertensive currently  Obesity class I - Outpatient follow-up  Goals of care - Palliative care evaluation appreciated.  CODE STATUS changed to DNR by palliative care team.  DVT prophylaxis: Lovenox  Code Status: DNR Family Communication: None at bedside Disposition Plan: Status is: Inpatient Remains inpatient appropriate because: Of severity of illness.  Need for SNF placement.  Currently medically stable for discharge to SNF.    Consultants: Psychiatry Procedures: EEG  Antimicrobials: None   Subjective: Patient seen and examined at bedside.  Poor historian.  No agitation, vomiting or fever overnight.   Objective: Vitals:   03/20/24 0019 03/20/24 0356 03/20/24 0500 03/20/24 0803  BP: 112/78 117/79  103/76  Pulse: 82 81  81  Resp: 18 17  18   Temp: 98 F (36.7 C) 97.8 F (36.6 C)  98.4 F (36.9 C)  TempSrc:      SpO2: 100% 100%  99%  Weight:   82.7 kg     Intake/Output Summary (Last 24 hours) at 03/20/2024 0803 Last data filed at 03/20/2024 0500 Gross per 24 hour  Intake  118 ml  Output 1150 ml  Net -1032 ml    Filed Weights   03/16/24 0445 03/17/24 0518 03/20/24 0500  Weight: 86 kg 85.8 kg 82.7 kg    Examination:  General: Currently  in no distress and on room air.  Chronically ill and deconditioned.   respiratory: Decreased breath sounds at bases, no wheezing  CVS: S1-S2 heard; Rate mostly controlled  abdominal: Soft, nontender, distended slightly; no organomegaly, bowel sounds are heard normally  extremities: No clubbing; mild lower extremity edema present  Data Reviewed: I have personally reviewed following labs and imaging studies  CBC: Recent Labs  Lab 03/14/24 1205 03/15/24 0338 03/16/24 0148 03/18/24 0200  WBC 15.0* 10.2 8.3 7.4  NEUTROABS  --   --  5.3 4.6  HGB 13.8 13.1 12.8* 12.9*  HCT 42.7 38.6* 38.7* 37.8*  MCV 96.4 91.9 93.5 90.2  PLT 170 170 166 173   Basic Metabolic Panel: Recent Labs  Lab 03/14/24 1205 03/15/24 0338 03/16/24 0148 03/17/24 0201 03/18/24 0200  NA 137 138 137 135 138  K 4.3 3.8 4.3 3.9 3.5  CL 104 105 104 103 105  CO2 21* 23 24 22  21*  GLUCOSE 109* 71 72 85 84  BUN 23 20 25* 26* 20  CREATININE 1.40* 1.27* 1.36* 1.35* 1.27*  CALCIUM  9.3 8.8* 8.8* 8.5* 8.6*  MG  --   --   --  2.1 1.9   GFR: Estimated Creatinine Clearance: 50 mL/min (A) (by C-G formula based on SCr of 1.27 mg/dL (H)). Liver Function Tests: Recent Labs  Lab 03/14/24 1205 03/18/24 0200  AST 37 31  ALT 16 13  ALKPHOS 84 73  BILITOT 0.9 1.2  PROT 7.3 6.2*  ALBUMIN 3.9 3.0*   No results for input(s): LIPASE, AMYLASE in the last 168 hours. Recent Labs  Lab 03/14/24 1320  AMMONIA 17   Coagulation Profile: Recent Labs  Lab 03/15/24 0338  INR 1.1   Cardiac Enzymes: Recent Labs  Lab 03/14/24 1214 03/16/24 0148 03/17/24 0201  CKTOTAL 1,140* 820* 417*   BNP (last 3 results) No results for input(s): PROBNP in the last 8760 hours. HbA1C: No results for input(s): HGBA1C in the last 72 hours.  CBG: Recent Labs  Lab 03/18/24 1152 03/18/24 1643 03/19/24 1006 03/19/24 1406 03/19/24 1838  GLUCAP 89 82 108* 88 99   Lipid Profile: No results for input(s): CHOL, HDL,  LDLCALC, TRIG, CHOLHDL, LDLDIRECT in the last 72 hours.  Thyroid Function Tests: No results for input(s): TSH, T4TOTAL, FREET4, T3FREE, THYROIDAB in the last 72 hours.  Anemia Panel: No results for input(s): VITAMINB12, FOLATE, FERRITIN, TIBC, IRON, RETICCTPCT in the last 72 hours.  Sepsis Labs: No results for input(s): PROCALCITON, LATICACIDVEN in the last 168 hours.  Recent Results (from the past 240 hours)  Urine Culture     Status: None   Collection Time: 03/14/24  1:05 PM   Specimen: In/Out Cath Urine  Result Value Ref Range Status   Specimen Description IN/OUT CATH URINE  Final   Special Requests NONE  Final   Culture   Final    NO GROWTH Performed at [facility], [address], [place], [place] [address]    Report Status 03/15/2024 FINAL  Final         Radiology Studies: No results found.       Scheduled Meds:  ciprofloxacin   2 drop Both Eyes Q4H while awake   enoxaparin  (LOVENOX ) injection  40 mg Subcutaneous Q24H  folic acid   1 mg Oral Daily   insulin  aspart  0-9 Units Subcutaneous TID WC   ketorolac   15 mg Intravenous Once   levETIRAcetam   500 mg Oral BID   methylPREDNISolone  (SOLU-MEDROL ) injection  125 mg Intravenous Once   sodium chloride  flush  3 mL Intravenous Q12H   thiamine   100 mg Oral Daily   Continuous Infusions:        Sophie Mao, MD [facility] 03/20/2024, 8:03 AM

## 2024-03-20 NOTE — Plan of Care (Signed)
  Problem: Coping: Goal: Ability to adjust to condition or change in health will improve Outcome: Progressing   Problem: Fluid Volume: Goal: Ability to maintain a balanced intake and output will improve Outcome: Progressing   Problem: Metabolic: Goal: Ability to maintain appropriate glucose levels will improve Outcome: Adequate for Discharge   Problem: Nutritional: Goal: Maintenance of adequate nutrition will improve Outcome: Progressing   Problem: Skin Integrity: Goal: Risk for impaired skin integrity will decrease Outcome: Progressing

## 2024-03-20 NOTE — Progress Notes (Signed)
 Mobility Specialist Progress Note:    03/20/24 1024  Mobility  Activity Ambulated with assistance (In hallway)  Level of Assistance Contact guard assist, steadying assist  Assistive Device Front wheel walker  Distance Ambulated (ft) 250 ft  Activity Response Tolerated well  Mobility Referral Yes  Mobility visit 1 Mobility  Mobility Specialist Start Time (ACUTE ONLY) [id number]  Mobility Specialist Stop Time (ACUTE ONLY) 0934  Mobility Specialist Time Calculation (min) (ACUTE ONLY) 10 min   Received pt in bed and eager for mobility. No physical assistance needed. No c/o. Returned to room without fault. Left pt in bed with alarm on. Personal belongings and call light within reach. All needs met.  Lavanda Pollack Mobility Specialist  Please contact via [employer] or  Rehab Office [phone]

## 2024-03-21 DIAGNOSIS — R4182 Altered mental status, unspecified: Secondary | ICD-10-CM | POA: Diagnosis not present

## 2024-03-21 LAB — GLUCOSE, CAPILLARY
Glucose-Capillary: 122 mg/dL — ABNORMAL HIGH (ref 70–99)
Glucose-Capillary: 162 mg/dL — ABNORMAL HIGH (ref 70–99)

## 2024-03-21 NOTE — Progress Notes (Signed)
 Patient is refusing accuchecks and reports that he is not a diabetic.

## 2024-03-21 NOTE — Progress Notes (Signed)
Pt refused accu-check.

## 2024-03-21 NOTE — Progress Notes (Signed)
 Mobility Specialist: Progress Note   03/21/24 1400  Mobility  Activity Ambulated with assistance  Level of Assistance Contact guard assist, steadying assist  Assistive Device Front wheel walker  Distance Ambulated (ft) 500 ft  Activity Response Tolerated well  Mobility Referral Yes  Mobility visit 1 Mobility  Mobility Specialist Start Time (ACUTE ONLY) [id number]  Mobility Specialist Stop Time (ACUTE ONLY) 0910  Mobility Specialist Time Calculation (min) (ACUTE ONLY) 6 min    Pt received in bed, agreeable to mobility session. CGA throughout, no complaints. Returned to room. Requested to sit on the commode for awhile. Left in BR with all needs met, call bell in reach.   Ileana Lute Mobility Specialist Please contact via [employer] or Rehab office at [phone]

## 2024-03-21 NOTE — TOC Progression Note (Signed)
 Transition of Care [facility]) - Progression Note    Patient Details  Name: Terry Baxter MRN: [id number] Date of Birth: [birth year]  Transition of Care [facility]) CM/SW Contact  Bridget Cordella Simmonds, LCSW Phone Number: 03/21/2024, 9:43 AM  Clinical Narrative:   [name] still pending.   1330: [name] still pending.   Expected Discharge Plan: Skilled Nursing Facility Barriers to Discharge: Insurance Authorization, SNF Pending bed offer, Continued Medical Work up               Expected Discharge Plan and Services       Living arrangements for the past 2 months: Apartment                                       Social Drivers of Health (SDOH) Interventions SDOH Screenings   Food Insecurity: No Food Insecurity (03/16/2024)  Housing: Unknown (03/16/2024)  Transportation Needs: No Transportation Needs (03/16/2024)  Utilities: Not At Risk (03/16/2024)  Social Connections: Socially Isolated (03/16/2024)  Tobacco Use: Low Risk  (03/14/2024)    Readmission Risk Interventions     No data to display

## 2024-03-21 NOTE — Care Management Important Message (Signed)
 Important Message  Patient Details  Name: Terry Baxter MRN: [id number] Date of Birth: Jul 21, 1946   Important Message Given:      IM Given on 8/23/02025  Claretta Deed 03/21/2024, 8:16 AM

## 2024-03-21 NOTE — Progress Notes (Signed)
 PROGRESS NOTE    Terry Baxter  FMW:[id number] DOB: 04/18/1946 DOA: 03/14/2024 PCP: Andria Pipe, MD   Brief Narrative:  78 years old male with documented history of bipolar disorder, chronic back pain, hepatitis C and history of heroin abuse, history of TIA, hyperlipidemia, hypertension, seizure disorder and type 2 diabetes melitis presented with altered mental status; family member found him on the floor.  On presentation, CT of the head was unremarkable.  He could not tolerate MRI of brain.  Creatinine of 1.4, total CK at 1140 with WBC of 15.  Urine drug screen negative.  Chest x-ray without any infiltrate.  He was started on IV fluids.  Patient has refused multiple attempts at MRI.  EEG negative for seizures.  PT recommending SNF placement.  TOC consulted.  Psychiatry consulted for possible suicidal ideation: Psychiatry evaluated the patient on 03/17/2024 who thought that patient was not suicidal and signed off.  Currently medically stable for discharge to SNF.  Assessment & Plan:   Acute metabolic encephalopathy: Present on admission -Likely multifactorial from volume depletion along with narcotic use - Imaging as above. -Monitor mental status.  Still slightly slow to respond. -Fall precautions/seizure precautions. -PT recommending SNF placement.  TOC following. - Ammonia, B12 and TSH levels normal -Patient has refused multiple attempts at MRI: MRI order was subsequently discontinued on 03/17/2024.  - EEG negative for seizures - Currently medically stable for discharge to SNF.  Suicidal ideation -As per nursing staff, patient stated I want to die on 03/16/2024. Psychiatry evaluated the patient on 03/17/2024 who thought that patient was not suicidal and signed off.  Fall Elevated CK/mild rhabdomyolysis Generalized weakness/physical deconditioning - CK improving to 417 on 03/17/2024.  Treated with IV fluids and subsequently discontinued.  Encourage oral intake. - Fall  precautions.  Volume depletion/dehydration - Treated with IV fluids and discontinued  CKD stage IIIa - Creatinine currently at baseline.  Acute metabolic acidosis - Mild.  Monitor intermittently.  History of seizure disorder - Continue Keppra   Diabetes mellitus type 2-A1c 5.3.  Continue CBGs with SSI.  Chronic back pain with narcotic dependence -Oxycodone  on hold for now.  Outpatient follow-up with PCP and/or pain management  Anxiety/bipolar disorder-supposed to be on Wellbutrin , Klonopin  and risperidone  as an outpatient but patient does not take them.  Bilateral conjunctival injection: Possibility of conjunctivitis - Improved.  Treated with Cipro  eyedrops and discontinued.  Hypertension - Blood pressure currently stable.  Not on any antihypertensive currently  Obesity class I - Outpatient follow-up  Goals of care - Palliative care evaluation appreciated.  CODE STATUS changed to DNR by palliative care team.  DVT prophylaxis: Lovenox  Code Status: DNR Family Communication: None at bedside Disposition Plan: Status is: Inpatient Remains inpatient appropriate because: Of severity of illness.  Need for SNF placement.  Currently medically stable for discharge to SNF.    Consultants: Psychiatry Procedures: EEG  Antimicrobials: None   Subjective: Patient seen and examined at bedside.  Poor historian.  No fever, vomiting, seizures or agitation reported.   Objective: Vitals:   03/20/24 1625 03/20/24 2050 03/21/24 0033 03/21/24 0457  BP: 122/83 118/83 95/82 111/79  Pulse: 72 75 74 74  Resp: 16 16 17 18   Temp: 98.2 F (36.8 C) 98.3 F (36.8 C) 98.3 F (36.8 C) 97.6 F (36.4 C)  TempSrc:  Oral Oral Oral  SpO2: 100% 100% 100% 97%  Weight:        Intake/Output Summary (Last 24 hours) at 03/21/2024 0729 Last data filed at 03/21/2024 [phone]  Gross per 24 hour  Intake 476 ml  Output 900 ml  Net -424 ml    Filed Weights   03/16/24 0445 03/17/24 0518 03/20/24 0500   Weight: 86 kg 85.8 kg 82.7 kg    Examination:  General: Currently on room air and in no distress.  Chronically ill and deconditioned.   respiratory: Bilateral decreased breath sounds at bases with some scattered crackles  CVS: Rate currently controlled; S1 and S2 are heard  abdominal: Soft, nontender, remains mildly distended; no organomegaly, normal bowel sounds are heard  extremities: Trace lower extremity edema present; no cyanosis  Data Reviewed: I have personally reviewed following labs and imaging studies  CBC: Recent Labs  Lab 03/14/24 1205 03/15/24 0338 03/16/24 0148 03/18/24 0200  WBC 15.0* 10.2 8.3 7.4  NEUTROABS  --   --  5.3 4.6  HGB 13.8 13.1 12.8* 12.9*  HCT 42.7 38.6* 38.7* 37.8*  MCV 96.4 91.9 93.5 90.2  PLT 170 170 166 173   Basic Metabolic Panel: Recent Labs  Lab 03/14/24 1205 03/15/24 0338 03/16/24 0148 03/17/24 0201 03/18/24 0200  NA 137 138 137 135 138  K 4.3 3.8 4.3 3.9 3.5  CL 104 105 104 103 105  CO2 21* 23 24 22  21*  GLUCOSE 109* 71 72 85 84  BUN 23 20 25* 26* 20  CREATININE 1.40* 1.27* 1.36* 1.35* 1.27*  CALCIUM  9.3 8.8* 8.8* 8.5* 8.6*  MG  --   --   --  2.1 1.9   GFR: Estimated Creatinine Clearance: 50 mL/min (A) (by C-G formula based on SCr of 1.27 mg/dL (H)). Liver Function Tests: Recent Labs  Lab 03/14/24 1205 03/18/24 0200  AST 37 31  ALT 16 13  ALKPHOS 84 73  BILITOT 0.9 1.2  PROT 7.3 6.2*  ALBUMIN 3.9 3.0*   No results for input(s): LIPASE, AMYLASE in the last 168 hours. Recent Labs  Lab 03/14/24 1320  AMMONIA 17   Coagulation Profile: Recent Labs  Lab 03/15/24 0338  INR 1.1   Cardiac Enzymes: Recent Labs  Lab 03/14/24 1214 03/16/24 0148 03/17/24 0201  CKTOTAL 1,140* 820* 417*   BNP (last 3 results) No results for input(s): PROBNP in the last 8760 hours. HbA1C: No results for input(s): HGBA1C in the last 72 hours.  CBG: Recent Labs  Lab 03/19/24 1006 03/19/24 1406 03/19/24 1838  03/20/24 1252 03/20/24 2049  GLUCAP 108* 88 99 101* 130*   Lipid Profile: No results for input(s): CHOL, HDL, LDLCALC, TRIG, CHOLHDL, LDLDIRECT in the last 72 hours.  Thyroid Function Tests: No results for input(s): TSH, T4TOTAL, FREET4, T3FREE, THYROIDAB in the last 72 hours.  Anemia Panel: No results for input(s): VITAMINB12, FOLATE, FERRITIN, TIBC, IRON, RETICCTPCT in the last 72 hours.  Sepsis Labs: No results for input(s): PROCALCITON, LATICACIDVEN in the last 168 hours.  Recent Results (from the past 240 hours)  Urine Culture     Status: None   Collection Time: 03/14/24  1:05 PM   Specimen: In/Out Cath Urine  Result Value Ref Range Status   Specimen Description IN/OUT CATH URINE  Final   Special Requests NONE  Final   Culture   Final    NO GROWTH Performed at [facility], [address] [place], [place] [address]    Report Status 03/15/2024 FINAL  Final         Radiology Studies: No results found.       Scheduled Meds:  ciprofloxacin   2 drop Both Eyes Q4H  while awake   enoxaparin  (LOVENOX ) injection  40 mg Subcutaneous Q24H   folic acid   1 mg Oral Daily   insulin  aspart  0-9 Units Subcutaneous TID WC   ketorolac   15 mg Intravenous Once   levETIRAcetam   500 mg Oral BID   methylPREDNISolone  (SOLU-MEDROL ) injection  125 mg Intravenous Once   sodium chloride  flush  3 mL Intravenous Q12H   thiamine   100 mg Oral Daily   Continuous Infusions:        Sophie Mao, MD [facility] 03/21/2024, 7:29 AM

## 2024-03-21 NOTE — Progress Notes (Signed)
 Physical Therapy Treatment Patient Details Name: Terryn Redner MRN: [id number] DOB: 1946-05-02 Today's Date: 03/21/2024   History of Present Illness Patient is a 78 y/o male admitted 03/14/24 with AMS and weakness after family found him no the floor.  CTH unremarkable and EEG pending.  Felt possibly due to volume depletion. Positive for mild rhabdomyolysis. Possible sx orthostatic hypotension 8/21 during PT session. PMH positive for seizure disorder, chronic back pain, bipolar disorder, HTN CKD II, HTN and Hep C.    PT Comments  Pt progressing well towards all goals. Pt with flat affect but cooperative with PT. Pt able to improve ambulation tolerance with use of RW. Verbal cues for safe walker management. Acute PT to cont to follow.    If plan is discharge home, recommend the following: A lot of help with bathing/dressing/bathroom;A lot of help with walking and/or transfers;Supervision due to cognitive status;Assist for transportation;Assistance with cooking/housework   Can travel by private vehicle     No  Equipment Recommendations  Rolling walker (2 wheels)    Recommendations for Other Services       Precautions / Restrictions Precautions Precautions: Fall Recall of Precautions/Restrictions: Impaired Precaution/Restrictions Comments: seizure prec Restrictions Weight Bearing Restrictions Per Provider Order: No     Mobility  Bed Mobility Overal bed mobility: Needs Assistance Bed Mobility: Supine to Sit, Sit to Supine     Supine to sit: Supervision Sit to supine: Modified independent (Device/Increase time)   General bed mobility comments: Extra time needed, but able to complete incremental scoots to [facility]    Transfers Overall transfer level: Needs assistance Equipment used: Rolling walker (2 wheels) Transfers: Sit to/from Stand Sit to Stand: Supervision, From elevated surface           General transfer comment: no physical assistance required for standing from elevated  bed height    Ambulation/Gait Ambulation/Gait assistance: Contact guard assist Gait Distance (Feet): 120 Feet Assistive device: Rolling walker (2 wheels) Gait Pattern/deviations: Step-through pattern, Trunk flexed Gait velocity: decreased     General Gait Details: verbal cues for upright posture and to stay in RW/keep RW close to self, denied onset of pain, dizziness, or SOB   Stairs             Wheelchair Mobility     Tilt Bed    Modified Rankin (Stroke Patients Only)       Balance Overall balance assessment: Needs assistance Sitting-balance support: Feet supported, No upper extremity supported Sitting balance-Leahy Scale: Fair     Standing balance support: Bilateral upper extremity supported Standing balance-Leahy Scale: Poor Standing balance comment: heavy reliance on rolling walker for support in standing                            Communication Communication Communication: No apparent difficulties  Cognition Arousal: Alert Behavior During Therapy: WFL for tasks assessed/performed   PT - Cognitive impairments: No apparent impairments   Orientation impairments: Place, Situation                   PT - Cognition Comments: patient is cooperative and participates with therapy without difficulty Following commands: Impaired Following commands impaired: Follows multi-step commands inconsistently, Follows one step commands with increased time    Cueing Cueing Techniques: Verbal cues, Gestural cues, Tactile cues  Exercises      General Comments General comments (skin integrity, edema, etc.): VSS      Pertinent Vitals/Pain Pain Assessment Pain Assessment: No/denies  pain    Home Living                          Prior Function            PT Goals (current goals can now be found in the care plan section) Acute Rehab PT Goals Patient Stated Goal: To get stronger so I can go home PT Goal Formulation: With patient Time  For Goal Achievement: 03/29/24 Potential to Achieve Goals: Fair Progress towards PT goals: Progressing toward goals    Frequency    Min 2X/week      PT Plan      Co-evaluation              AM-PAC PT 6 Clicks Mobility   Outcome Measure  Help needed turning from your back to your side while in a flat bed without using bedrails?: None Help needed moving from lying on your back to sitting on the side of a flat bed without using bedrails?: A Little Help needed moving to and from a bed to a chair (including a wheelchair)?: A Little Help needed standing up from a chair using your arms (e.g., wheelchair or bedside chair)?: A Little Help needed to walk in hospital room?: A Little Help needed climbing 3-5 steps with a railing? : Total 6 Click Score: 17    End of Session Equipment Utilized During Treatment: Gait belt Activity Tolerance: Patient tolerated treatment well Patient left: in bed;with call bell/phone within reach;with bed alarm set Nurse Communication: Mobility status PT Visit Diagnosis: Other abnormalities of gait and mobility (R26.89);Pain Pain - Right/Left: Left (both) Pain - part of body: Knee     Time: 1150-1158 PT Time Calculation (min) (ACUTE ONLY): 8 min  Charges:    $Gait Training: 8-22 mins PT General Charges $$ ACUTE PT VISIT: 1 Visit                     Norene Ames, PT, DPT Acute Rehabilitation Services Secure chat preferred Office #: [phone]    Norene CHRISTELLA Ames 03/21/2024, 12:50 PM

## 2024-03-21 NOTE — Plan of Care (Signed)
  Problem: Education: Goal: Ability to describe self-care measures that may prevent or decrease complications (Diabetes Survival Skills Education) will improve Outcome: Not Progressing Goal: Individualized Educational Video(s) Outcome: Not Applicable

## 2024-03-22 DIAGNOSIS — R4182 Altered mental status, unspecified: Secondary | ICD-10-CM | POA: Diagnosis not present

## 2024-03-22 LAB — GLUCOSE, CAPILLARY
Glucose-Capillary: 146 mg/dL — ABNORMAL HIGH (ref 70–99)
Glucose-Capillary: 110 mg/dL — ABNORMAL HIGH (ref 70–99)
Glucose-Capillary: 134 mg/dL — ABNORMAL HIGH (ref 70–99)

## 2024-03-22 NOTE — Plan of Care (Signed)

## 2024-03-22 NOTE — TOC Progression Note (Signed)
 Transition of Care [facility]) - Progression Note    Patient Details  Name: Terry Baxter MRN: [id number] Date of Birth: 1945/08/03  Transition of Care [facility]) CM/SW Contact  Bridget Cordella Simmonds, LCSW Phone Number: 03/22/2024, 8:19 AM  Clinical Narrative:   [name] is offering P2P for SNF auth, due by noon today: [phone] Mzq#[id number]  MD notified along with CSW covering today.    Expected Discharge Plan: Skilled Nursing Facility Barriers to Discharge: Insurance Authorization, SNF Pending bed offer, Continued Medical Work up               Expected Discharge Plan and Services       Living arrangements for the past 2 months: Apartment                                       Social Drivers of Health (SDOH) Interventions SDOH Screenings   Food Insecurity: No Food Insecurity (03/16/2024)  Housing: Unknown (03/16/2024)  Transportation Needs: No Transportation Needs (03/16/2024)  Utilities: Not At Risk (03/16/2024)  Social Connections: Socially Isolated (03/16/2024)  Tobacco Use: Low Risk  (03/14/2024)    Readmission Risk Interventions     No data to display

## 2024-03-22 NOTE — Progress Notes (Signed)
 Occupational Therapy Treatment Patient Details Name: Terry Baxter MRN: [id number] DOB: Sep 02, 1945 Today's Date: 03/22/2024   History of present illness Patient is a 78 y/o male admitted 03/14/24 with AMS and weakness after family found him no the floor.  CTH unremarkable and EEG pending.  Felt possibly due to volume depletion. Positive for mild rhabdomyolysis. Possible sx orthostatic hypotension 8/21 during PT session. PMH positive for seizure disorder, chronic back pain, bipolar disorder, HTN CKD II, HTN and Hep C.   OT comments  Patient received in supine and eager to participate with OT. Patient able to answer all orientation questions and able to perform self care tasks with setup to CGA with standing tasks. Patient able to perform mobility in hallway with supervision to [facility] and able to navigate back to his room without cues. Patient will benefit from continued inpatient follow up therapy, <3 hours/day. Acute OT to continue to follow to address established goals to facilitate DC to next venue of care.        If plan is discharge home, recommend the following:  A little help with walking and/or transfers;A little help with bathing/dressing/bathroom;Assistance with cooking/housework;Direct supervision/assist for medications management;Direct supervision/assist for financial management;Assist for transportation;Help with stairs or ramp for entrance;Supervision due to cognitive status   Equipment Recommendations  Other (comment) (defer to next venue of care)    Recommendations for Other Services      Precautions / Restrictions Precautions Precautions: Fall Recall of Precautions/Restrictions: Impaired Precaution/Restrictions Comments: seizure prec Restrictions Weight Bearing Restrictions Per Provider Order: No       Mobility Bed Mobility Overal bed mobility: Needs Assistance Bed Mobility: Supine to Sit, Sit to Supine     Supine to sit: Supervision Sit to supine: Modified  independent (Device/Increase time)   General bed mobility comments: increased time but no physical assistance    Transfers Overall transfer level: Needs assistance Equipment used: Rolling walker (2 wheels) Transfers: Sit to/from Stand Sit to Stand: Supervision, From elevated surface           General transfer comment: supervision and cues for safety     Balance Overall balance assessment: Needs assistance Sitting-balance support: Feet supported, No upper extremity supported Sitting balance-Leahy Scale: Fair     Standing balance support: Bilateral upper extremity supported Standing balance-Leahy Scale: Poor Standing balance comment: reliant on rolling walker for support in standing                           ADL either performed or assessed with clinical judgement   ADL Overall ADL's : Needs assistance/impaired     Grooming: Wash/dry hands;Wash/dry face;Set up;Standing Grooming Details (indicate cue type and reason): at sink     Lower Body Bathing: Contact guard assist;Sit to/from stand;Sitting/lateral leans   Upper Body Dressing : Set up;Sitting   Lower Body Dressing: Contact guard assist;Sitting/lateral leans;Sit to/from stand   Toilet Transfer: Contact guard assist;Ambulation;Rolling walker (2 wheels) Toilet Transfer Details (indicate cue type and reason): simulated                Extremity/Trunk Assessment              Vision       Perception     Praxis     Communication Communication Communication: No apparent difficulties   Cognition Arousal: Alert Behavior During Therapy: WFL for tasks assessed/performed Cognition: Cognition impaired     Awareness: Intellectual awareness impaired, Online awareness impaired Memory impairment (select all  impairments): Short-term memory, Working [employer] fast streamer, Copywriter, advertising, Engineer, structural memory Attention impairment (select first level of impairment): Sustained attention    OT - Cognition Comments: alert and oriented x4                 Following commands: Impaired Following commands impaired: Follows multi-step commands inconsistently, Follows one step commands with increased time      Cueing   Cueing Techniques: Verbal cues, Gestural cues, Tactile cues  Exercises      Shoulder Instructions       General Comments VSS    Pertinent Vitals/ Pain       Pain Assessment Pain Assessment: No/denies pain Pain Intervention(s): Monitored during session  Home Living                                          Prior Functioning/Environment              Frequency  Min 2X/week        Progress Toward Goals  OT Goals(current goals can now be found in the care plan section)  Progress towards OT goals: Progressing toward goals  Acute Rehab OT Goals Patient Stated Goal: to get stronger OT Goal Formulation: With patient Time For Goal Achievement: 03/30/24 Potential to Achieve Goals: Good ADL Goals Pt Will Perform Lower Body Bathing: with modified independence;sitting/lateral leans;sit to/from stand Pt Will Perform Lower Body Dressing: with modified independence;sit to/from stand;sitting/lateral leans Pt Will Transfer to Toilet: with modified independence;ambulating;regular height toilet;grab bars Pt Will Perform Toileting - Clothing Manipulation and hygiene: with modified independence;sitting/lateral leans;sit to/from stand Additional ADL Goal #1: Patient will be able to complete trail making task or upper level cognitive task without correction to errors in order to be able to return home safely. Additional ADL Goal #2: Patient will be able to complete functional task in standing for 3 minutes prior to needing seated rest break in order to improve overall activity tolerance.  Plan      Co-evaluation                 AM-PAC OT 6 Clicks Daily Activity     Outcome Measure   Help from another person eating meals?: A  Little Help from another person taking care of personal grooming?: A Little Help from another person toileting, which includes using toliet, bedpan, or urinal?: A Little Help from another person bathing (including washing, rinsing, drying)?: A Little Help from another person to put on and taking off regular upper body clothing?: A Little Help from another person to put on and taking off regular lower body clothing?: A Little 6 Click Score: 18    End of Session Equipment Utilized During Treatment: Gait belt;Rolling walker (2 wheels)  OT Visit Diagnosis: Unsteadiness on feet (R26.81);Other abnormalities of gait and mobility (R26.89);Repeated falls (R29.6);History of falling (Z91.81);Muscle weakness (generalized) (M62.81);Other symptoms and signs involving cognitive function;Pain   Activity Tolerance Patient tolerated treatment well   Patient Left in bed;with call bell/phone within reach;with bed alarm set   Nurse Communication Mobility status        Time: 9071-9047 OT Time Calculation (min): 24 min  Charges: OT General Charges $OT Visit: 1 Visit OT Treatments $Self Care/Home Management : 8-22 mins $Therapeutic Activity: 8-22 mins  Dick Laine, OTA Acute Rehabilitation Services  Office [phone]   Jeb LITTIE Laine 03/22/2024, 12:10 PM

## 2024-03-22 NOTE — Progress Notes (Addendum)
 PROGRESS NOTE    Terry Baxter  FMW:[id number] DOB: 1946/06/04 DOA: 03/14/2024 PCP: Andria Pipe, MD   Brief Narrative:  78 years old male with documented history of bipolar disorder, chronic back pain, hepatitis C and history of heroin abuse, history of TIA, hyperlipidemia, hypertension, seizure disorder and type 2 diabetes melitis presented with altered mental status; family member found him on the floor.  On presentation, CT of the head was unremarkable.  He could not tolerate MRI of brain.  Creatinine of 1.4, total CK at 1140 with WBC of 15.  Urine drug screen negative.  Chest x-ray without any infiltrate.  He was started on IV fluids.  Patient has refused multiple attempts at MRI.  EEG negative for seizures.  PT recommending SNF placement.  TOC consulted.  Psychiatry consulted for possible suicidal ideation: Psychiatry evaluated the patient on 03/17/2024 who thought that patient was not suicidal and signed off.  Currently medically stable for discharge to SNF.  Assessment & Plan:   Acute metabolic encephalopathy: Present on admission -Likely multifactorial from volume depletion along with narcotic use - Imaging as above. -Monitor mental status.  Still slightly slow to respond. -Fall precautions/seizure precautions. -PT recommending SNF placement.  TOC following. - Ammonia, B12 and TSH levels normal -Patient refused multiple attempts at MRI: MRI order was subsequently discontinued on 03/17/2024.  - EEG negative for seizures - Currently medically stable for discharge to SNF.  Suicidal ideation -As per nursing staff, patient stated I want to die on 03/16/2024. Psychiatry evaluated the patient on 03/17/2024 who thought that patient was not suicidal and signed off.  Fall Elevated CK/mild rhabdomyolysis Generalized weakness/physical deconditioning - CK improving to 417 on 03/17/2024.  Treated with IV fluids and subsequently discontinued.  Encourage oral intake. - Fall  precautions.  Volume depletion/dehydration - Treated with IV fluids and discontinued  CKD stage IIIa - Creatinine currently at baseline.  Acute metabolic acidosis - Mild.  Monitor intermittently.  History of seizure disorder - Continue Keppra   Diabetes mellitus type 2-A1c 5.3.  Continue CBGs with SSI.  Chronic back pain with narcotic dependence -Oxycodone  on hold for now.  Outpatient follow-up with PCP and/or pain management  Anxiety/bipolar disorder-supposed to be on Wellbutrin , Klonopin  and risperidone  as an outpatient but patient does not take them.  Bilateral conjunctival injection: Possibility of conjunctivitis - Improved.  Treated with Cipro  eyedrops and discontinued.  Hypertension - Blood pressure currently stable.  Not on any antihypertensive currently  Obesity class I - Outpatient follow-up  Goals of care - Palliative care evaluation appreciated.  CODE STATUS changed to DNR by palliative care team.  DVT prophylaxis: Lovenox  Code Status: DNR Family Communication: None at bedside Disposition Plan: Status is: Inpatient Remains inpatient appropriate because: Of severity of illness.  Need for SNF placement.  Currently medically stable for discharge to SNF.  I called for peer to peer, was kept on hold for >25 minutes; left a voicemail with my number for callback. Awaiting callback.    Consultants: Psychiatry Procedures: EEG  Antimicrobials: None   Subjective: Patient seen and examined at bedside.  Poor historian.  No vomiting, abdominal pain, worsening shortness of breath reported.   Objective: Vitals:   03/21/24 1528 03/21/24 2029 03/22/24 0054 03/22/24 0458  BP: 100/77 110/70 113/73 128/75  Pulse: 72 65 65 65  Resp:  17 17 18   Temp: (!) 97.5 F (36.4 C) 98.7 F (37.1 C) 97.8 F (36.6 C) 97.6 F (36.4 C)  TempSrc:  Oral Oral Oral  SpO2: 98%  99% 99% 100%  Weight:       No intake or output data in the 24 hours ending 03/22/24 0727   Filed  Weights   03/16/24 0445 03/17/24 0518 03/20/24 0500  Weight: 86 kg 85.8 kg 82.7 kg    Examination:  General: Remains on room air and in no distress.  Chronically ill and deconditioned.  Flat affect.  Slow to respond.  Poor historian. respiratory: Breath sounds at bases with scattered crackles  CVS: 1 S2 heard; rate mostly controlled  abdominal: Soft, nontender, still slightly distended; no organomegaly, bowel sounds are heard  extremities: No clubbing, mild lower extremity edema present  Data Reviewed: I have personally reviewed following labs and imaging studies  CBC: Recent Labs  Lab 03/16/24 0148 03/18/24 0200  WBC 8.3 7.4  NEUTROABS 5.3 4.6  HGB 12.8* 12.9*  HCT 38.7* 37.8*  MCV 93.5 90.2  PLT 166 173   Basic Metabolic Panel: Recent Labs  Lab 03/16/24 0148 03/17/24 0201 03/18/24 0200  NA 137 135 138  K 4.3 3.9 3.5  CL 104 103 105  CO2 24 22 21*  GLUCOSE 72 85 84  BUN 25* 26* 20  CREATININE 1.36* 1.35* 1.27*  CALCIUM  8.8* 8.5* 8.6*  MG  --  2.1 1.9   GFR: Estimated Creatinine Clearance: 50 mL/min (A) (by C-G formula based on SCr of 1.27 mg/dL (H)). Liver Function Tests: Recent Labs  Lab 03/18/24 0200  AST 31  ALT 13  ALKPHOS 73  BILITOT 1.2  PROT 6.2*  ALBUMIN 3.0*   No results for input(s): LIPASE, AMYLASE in the last 168 hours. No results for input(s): AMMONIA in the last 168 hours.  Coagulation Profile: No results for input(s): INR, PROTIME in the last 168 hours.  Cardiac Enzymes: Recent Labs  Lab 03/16/24 0148 03/17/24 0201  CKTOTAL 820* 417*   BNP (last 3 results) No results for input(s): PROBNP in the last 8760 hours. HbA1C: No results for input(s): HGBA1C in the last 72 hours.  CBG: Recent Labs  Lab 03/19/24 1838 03/20/24 1252 03/20/24 2049 03/21/24 0742 03/21/24 1526  GLUCAP 99 101* 130* 122* 162*   Lipid Profile: No results for input(s): CHOL, HDL, LDLCALC, TRIG, CHOLHDL, LDLDIRECT in the last  72 hours.  Thyroid Function Tests: No results for input(s): TSH, T4TOTAL, FREET4, T3FREE, THYROIDAB in the last 72 hours.  Anemia Panel: No results for input(s): VITAMINB12, FOLATE, FERRITIN, TIBC, IRON, RETICCTPCT in the last 72 hours.  Sepsis Labs: No results for input(s): PROCALCITON, LATICACIDVEN in the last 168 hours.  Recent Results (from the past 240 hours)  Urine Culture     Status: None   Collection Time: 03/14/24  1:05 PM   Specimen: In/Out Cath Urine  Result Value Ref Range Status   Specimen Description IN/OUT CATH URINE  Final   Special Requests NONE  Final   Culture   Final    NO GROWTH Performed at [facility], [address], [place], [place] [address]    Report Status 03/15/2024 FINAL  Final         Radiology Studies: No results found.       Scheduled Meds:  ciprofloxacin   2 drop Both Eyes Q4H while awake   enoxaparin  (LOVENOX ) injection  40 mg Subcutaneous Q24H   folic acid   1 mg Oral Daily   insulin  aspart  0-9 Units Subcutaneous TID WC   ketorolac   15 mg Intravenous Once   levETIRAcetam   500 mg Oral BID  methylPREDNISolone  (SOLU-MEDROL ) injection  125 mg Intravenous Once   sodium chloride  flush  3 mL Intravenous Q12H   thiamine   100 mg Oral Daily   Continuous Infusions:        Sophie Mao, MD [facility] 03/22/2024, 7:27 AM

## 2024-03-22 NOTE — Care Management Important Message (Signed)
 Important Message  Patient Details  Name: Terry Baxter MRN: [id number] Date of Birth: September 06, 1945   Important Message Given:  Yes - Medicare IM     Claretta Deed 03/22/2024, 1:24 PM

## 2024-03-22 NOTE — Plan of Care (Signed)
  Problem: Education: Goal: Ability to describe self-care measures that may prevent or decrease complications (Diabetes Survival Skills Education) will improve Outcome: Not Progressing

## 2024-03-23 ENCOUNTER — Encounter (HOSPITAL_COMMUNITY): Payer: Self-pay | Admitting: Internal Medicine

## 2024-03-23 ENCOUNTER — Other Ambulatory Visit (HOSPITAL_COMMUNITY): Payer: Self-pay

## 2024-03-23 DIAGNOSIS — N1831 Chronic kidney disease, stage 3a: Secondary | ICD-10-CM | POA: Diagnosis not present

## 2024-03-23 DIAGNOSIS — E1122 Type 2 diabetes mellitus with diabetic chronic kidney disease: Secondary | ICD-10-CM

## 2024-03-23 DIAGNOSIS — E559 Vitamin D deficiency, unspecified: Secondary | ICD-10-CM | POA: Diagnosis not present

## 2024-03-23 DIAGNOSIS — R531 Weakness: Secondary | ICD-10-CM | POA: Diagnosis not present

## 2024-03-23 DIAGNOSIS — G9341 Metabolic encephalopathy: Secondary | ICD-10-CM | POA: Diagnosis not present

## 2024-03-23 DIAGNOSIS — T796XXA Traumatic ischemia of muscle, initial encounter: Secondary | ICD-10-CM

## 2024-03-23 DIAGNOSIS — E860 Dehydration: Secondary | ICD-10-CM

## 2024-03-23 DIAGNOSIS — E1129 Type 2 diabetes mellitus with other diabetic kidney complication: Secondary | ICD-10-CM

## 2024-03-23 LAB — GLUCOSE, CAPILLARY
Glucose-Capillary: 120 mg/dL — ABNORMAL HIGH (ref 70–99)
Glucose-Capillary: 138 mg/dL — ABNORMAL HIGH (ref 70–99)

## 2024-03-23 MED ORDER — LEVETIRACETAM 500 MG PO TABS
500.0000 mg | ORAL_TABLET | Freq: Two times a day (BID) | ORAL | 0 refills | Status: AC
  Filled 2024-03-23: qty 60, 30d supply, fill #0

## 2024-03-23 NOTE — Progress Notes (Signed)
 PROGRESS NOTE    Terry Baxter  FMW:[id number] DOB: [birth year] DOA: 03/14/2024 PCP: Andria Pipe, MD  Subjective: Pt seen and examined. Pt's brother wants to take him home.   Hospital Course: CC: altered mental status HPI: Patient is a 78 years old male with documented history of bipolar disorder, chronic back pain, hepatitis C and history of heroin abuse, history of TIA, hyperlipidemia, hypertension and seizure disorder with type 2 diabetes presented to hospital with complaints of altered mental status, not acting usual with generalized weakness.  Family member also found him on the floor and patient stated that he might have hurt his ankle. Patient denies any headache, nausea, vomiting, diarrhea. .  Denied head trauma. No mention of urinary urgency, frequency or dysuria.  Patient slightly confused about where he is and what was going on. Patient lives by himself in an apartment.  Spoke with the patient's brother on the phone, his brother lives across his brother.  In the ED, patient was afebrile, initial blood pressure was slightly elevated.  Labs were notable for WBC elevated at 15.0.  Chemistry showed creatinine elevation at 1.4 total CK at 1140.  Urinalysis was sent for RBC but no white cells.  Urine culture was sent.  Urine drug screen was negative.  CT head scan done in the ED showed no acute findings.  Chest x-ray without any infiltrate.  Patient was then considered for observation in the hospital.   Significant Events: Admitted 03/14/2024 for altered mental status   Admission Labs: UA large HgB, RBC >50, WBC 0-5, rare bacteria Na 137, K 4.3, CO2 of 21, BUN 23, Scr 1.4, glu 109 T protein 7.3, alb 3.9, AST 37, AST 16, alk phos 84, T. Bili 0.9 WBC 15, HgB 13.8, plt 170 CK 1140 NH3 of 17 UDS negative A1c of 5.3% TSH 2.96 Vit D 28.88 Vit B12 of 331 Vit B1 of 135  Admission Imaging Studies: CT head No acute intracranial abnormality, specifically, no acute hemorrhage, territorial  infarction, or intracranial mass. 2. Sequelae of chronic ischemic microvascular disease. CXR No active disease.    Significant Labs:   Significant Imaging Studies:   Antibiotic Therapy: Anti-infectives (From admission, onward)    None       Procedures:   Consultants: Psych Palliative care    Assessment and Plan: * Acute metabolic encephalopathy 03-23-2024 pt admitted to the hospital on 03-14-2024 due to altered mental status. He was treated for dehydration and rhabdomyolysis. His mentation improved for the next 9 days. CT head negative for acute CVA. Pt was prepped for discharge to SNF but pt and his brother want him to return home. Home health arranged as well as a rolling walker.  Vitamin D  deficiency 03-23-2024 vit D level low at 28.8. pt should see PCP in office to start Vit D and calcium .  Generalized weakness 03-23-2024 t admitted to the hospital on 03-14-2024 due to weakness and altered mental status. Pt has been found on the floor of his apartment. He was treated for dehydration and rhabdomyolysis. His mentation improved for the next 9 days. CT head negative for acute CVA. Pt was prepped for discharge to SNF but pt and his brother want him to return home. Home health arranged as well as a rolling walker.   Dehydration-resolved as of 03/23/2024 03-23-2024 pt was dehydrated on admission with BUN 23 and Scr 1.4. he was treated with IVF. Now resolved.  Traumatic rhabdomyolysis (HCC)-resolved as of 03/23/2024 03-23-2024 pt was found on the floor of his  apartment. He was treated with IVF. CK on admission was 1140. Now resolved.  DM (diabetes mellitus), type 2 with renal complications (HCC) 03-23-2024 pt placed on SSI during hospitalization. Given his recent fall at home, unclear if it could have been related to any hypoglycemia. For now, I think it is safest not to start any hypoglycemic agents. Metformin contraindicated in CKD stage 3a.  CKD stage 3a, GFR 45-59 ml/min  (HCC) 03-23-2024 baseline scr 1.2-1.3  Essential hypertension 03-23-2024 pt did not require any HTN meds during his hospitalization.  Seizure disorder (HCC) 03-23-2024 pt restarted on his keppra  500 mg bid.      DVT prophylaxis: enoxaparin  (LOVENOX ) injection 40 mg Start: 03/14/24 1600 SCDs Start: 03/14/24 1555    Code Status: Limited: Do not attempt resuscitation (DNR) -DNR-LIMITED -Do Not Intubate/DNI  Family Communication: no family at bedside. TOC has talked with pt's brother. Pt's brother wants pt to return home. Disposition Plan: home Reason for continuing need for hospitalization: medically stable for DC  Objective: Vitals:   03/23/24 0404 03/23/24 0500 03/23/24 0745 03/23/24 1229  BP: (!) 129/91  120/83 94/71  Pulse: 72  67 67  Resp: 16  17   Temp:   98.5 F (36.9 C) 97.9 F (36.6 C)  TempSrc:   Oral   SpO2: 100%   100%  Weight:  83.8 kg      Intake/Output Summary (Last 24 hours) at 03/23/2024 1458 Last data filed at 03/22/2024 2211 Gross per 24 hour  Intake 237 ml  Output 300 ml  Net -63 ml   Filed Weights   03/17/24 0518 03/20/24 0500 03/23/24 0500  Weight: 85.8 kg 82.7 kg 83.8 kg    Examination:  Physical Exam Vitals and nursing note reviewed.  Constitutional:      General: He is not in acute distress.    Appearance: He is normal weight. He is not toxic-appearing or diaphoretic.  HENT:     Head: Normocephalic and atraumatic.     Nose: Nose normal.  Cardiovascular:     Rate and Rhythm: Normal rate and regular rhythm.  Pulmonary:     Effort: Pulmonary effort is normal.  Abdominal:     General: Abdomen is flat. Bowel sounds are normal. There is no distension.     Palpations: Abdomen is soft.  Musculoskeletal:     Right lower leg: No edema.     Left lower leg: No edema.  Skin:    General: Skin is warm and dry.     Capillary Refill: Capillary refill takes less than 2 seconds.  Neurological:     Mental Status: He is alert and oriented to  person, place, and time.     Data Reviewed: I have personally reviewed following labs and imaging studies  CBC: Recent Labs  Lab 03/18/24 0200  WBC 7.4  NEUTROABS 4.6  HGB 12.9*  HCT 37.8*  MCV 90.2  PLT 173   Basic Metabolic Panel: Recent Labs  Lab 03/17/24 0201 03/18/24 0200  NA 135 138  K 3.9 3.5  CL 103 105  CO2 22 21*  GLUCOSE 85 84  BUN 26* 20  CREATININE 1.35* 1.27*  CALCIUM  8.5* 8.6*  MG 2.1 1.9   GFR: Estimated Creatinine Clearance: 50.3 mL/min (A) (by C-G formula based on SCr of 1.27 mg/dL (H)). Liver Function Tests: Recent Labs  Lab 03/18/24 0200  AST 31  ALT 13  ALKPHOS 73  BILITOT 1.2  PROT 6.2*  ALBUMIN 3.0*   Cardiac Enzymes: Recent  Labs  Lab 03/17/24 0201  CKTOTAL 417*   CBG: Recent Labs  Lab 03/22/24 1156 03/22/24 1557 03/22/24 2023 03/23/24 0832 03/23/24 1229  GLUCAP 146* 110* 134* 120* 138*    Recent Results (from the past 240 hours)  Urine Culture     Status: None   Collection Time: 03/14/24  1:05 PM   Specimen: In/Out Cath Urine  Result Value Ref Range Status   Specimen Description IN/OUT CATH URINE  Final   Special Requests NONE  Final   Culture   Final    NO GROWTH Performed at [facility], [address], [place], [place] [address]    Report Status 03/15/2024 FINAL  Final    Scheduled Meds:  ciprofloxacin   2 drop Both Eyes Q4H while awake   enoxaparin  (LOVENOX ) injection  40 mg Subcutaneous Q24H   folic acid   1 mg Oral Daily   insulin  aspart  0-9 Units Subcutaneous TID WC   levETIRAcetam   500 mg Oral BID   methylPREDNISolone  (SOLU-MEDROL ) injection  125 mg Intravenous Once   sodium chloride  flush  3 mL Intravenous Q12H   thiamine   100 mg Oral Daily   Continuous Infusions:   LOS: 8 days   Time spent: 65 minutes  Camellia Door, DO  [facility]  03/23/2024, 2:58 PM

## 2024-03-23 NOTE — TOC Progression Note (Signed)
 Transition of Care [facility]) - Progression Note    Patient Details  Name: Terry Baxter MRN: [id number] Date of Birth: [birth year]  Transition of Care [facility]) CM/SW Contact  Jhada Risk A [place], LCSW Phone Number: 03/23/2024, 12:21 PM  Clinical Narrative:     CSW reached out to [employer] regarding information for the [employer]. Attendant stated that insurance provider attempted to reach out to pt attending MD, CSW informed her that a new provider is on today and asked if they could reach out and complete P2P. Requested MD reach out at soon as able. CSW provided MD information for peer review. Status of appeal pending.   CSW will continue to follow.   Expected Discharge Plan: Skilled Nursing Facility Barriers to Discharge: Insurance Authorization, SNF Pending bed offer, Continued Medical Work up               Expected Discharge Plan and Services       Living arrangements for the past 2 months: Apartment                                       Social Drivers of Health (SDOH) Interventions SDOH Screenings   Food Insecurity: No Food Insecurity (03/16/2024)  Housing: Unknown (03/16/2024)  Transportation Needs: No Transportation Needs (03/16/2024)  Utilities: Not At Risk (03/16/2024)  Social Connections: Socially Isolated (03/16/2024)  Tobacco Use: Low Risk  (03/14/2024)    Readmission Risk Interventions     No data to display

## 2024-03-23 NOTE — Subjective & Objective (Signed)
 Pt seen and examined. Pt's brother wants to take him home.

## 2024-03-23 NOTE — Assessment & Plan Note (Addendum)
 03-23-2024 pt was found on the floor of his apartment. He was treated with IVF. CK on admission was 1140. Now resolved.

## 2024-03-23 NOTE — Assessment & Plan Note (Addendum)
 03-23-2024 pt placed on SSI during hospitalization. Given his recent fall at home, unclear if it could have been related to any hypoglycemia. For now, I think it is safest not to start any hypoglycemic agents. Metformin contraindicated in CKD stage 3a.

## 2024-03-23 NOTE — Plan of Care (Signed)
  Problem: Education: Goal: Ability to describe self-care measures that may prevent or decrease complications (Diabetes Survival Skills Education) will improve Outcome: Adequate for Discharge   Problem: Coping: Goal: Ability to adjust to condition or change in health will improve Outcome: Adequate for Discharge   Problem: Fluid Volume: Goal: Ability to maintain a balanced intake and output will improve Outcome: Adequate for Discharge   Problem: Health Behavior/Discharge Planning: Goal: Ability to identify and utilize available resources and services will improve Outcome: Adequate for Discharge Goal: Ability to manage health-related needs will improve Outcome: Adequate for Discharge   Problem: Metabolic: Goal: Ability to maintain appropriate glucose levels will improve Outcome: Adequate for Discharge   Problem: Nutritional: Goal: Maintenance of adequate nutrition will improve Outcome: Adequate for Discharge Goal: Progress toward achieving an optimal weight will improve Outcome: Adequate for Discharge   Problem: Skin Integrity: Goal: Risk for impaired skin integrity will decrease Outcome: Adequate for Discharge   Problem: Tissue Perfusion: Goal: Adequacy of tissue perfusion will improve Outcome: Adequate for Discharge   Problem: Education: Goal: Knowledge of General Education information will improve Description: Including pain rating scale, medication(s)/side effects and non-pharmacologic comfort measures Outcome: Adequate for Discharge   Problem: Health Behavior/Discharge Planning: Goal: Ability to manage health-related needs will improve Outcome: Adequate for Discharge   Problem: Clinical Measurements: Goal: Ability to maintain clinical measurements within normal limits will improve Outcome: Adequate for Discharge Goal: Will remain free from infection Outcome: Adequate for Discharge Goal: Diagnostic test results will improve Outcome: Adequate for Discharge Goal:  Respiratory complications will improve Outcome: Adequate for Discharge Goal: Cardiovascular complication will be avoided Outcome: Adequate for Discharge   Problem: Activity: Goal: Risk for activity intolerance will decrease Outcome: Adequate for Discharge   Problem: Nutrition: Goal: Adequate nutrition will be maintained Outcome: Adequate for Discharge   Problem: Coping: Goal: Level of anxiety will decrease Outcome: Adequate for Discharge   Problem: Elimination: Goal: Will not experience complications related to bowel motility Outcome: Adequate for Discharge Goal: Will not experience complications related to urinary retention Outcome: Adequate for Discharge   Problem: Pain Managment: Goal: General experience of comfort will improve and/or be controlled Outcome: Adequate for Discharge   Problem: Safety: Goal: Ability to remain free from injury will improve Outcome: Adequate for Discharge   Problem: Skin Integrity: Goal: Risk for impaired skin integrity will decrease Outcome: Adequate for Discharge   Problem: Acute Rehab PT Goals(only PT should resolve) Goal: Pt Will Go Supine/Side To Sit Outcome: Adequate for Discharge Goal: Patient Will Transfer Sit To/From Stand Outcome: Adequate for Discharge Goal: Pt Will Transfer Bed To Chair/Chair To Bed Outcome: Adequate for Discharge Goal: Pt Will Perform Standing Balance Or Pre-Gait Outcome: Adequate for Discharge Goal: Pt Will Ambulate Outcome: Adequate for Discharge   Problem: Acute Rehab OT Goals (only OT should resolve) Goal: Pt. Will Perform Lower Body Bathing Outcome: Adequate for Discharge Goal: Pt. Will Perform Lower Body Dressing Outcome: Adequate for Discharge Goal: Pt. Will Transfer To Toilet Outcome: Adequate for Discharge Goal: Pt. Will Perform Toileting-Clothing Manipulation Outcome: Adequate for Discharge Goal: OT Additional ADL Goal #1 Outcome: Adequate for Discharge Goal: OT Additional ADL Goal  #2 Outcome: Adequate for Discharge

## 2024-03-23 NOTE — Progress Notes (Signed)
 Lm for brother to call to review AVS    TOC meds in packet with AVS fof discharge  Floor to finish discharge when family arrives to pick up

## 2024-03-23 NOTE — TOC Progression Note (Signed)
 Transition of Care [facility]) - Progression Note    Patient Details  Name: Terry Baxter MRN: [id number] Date of Birth: [birth year]  Transition of Care [facility]) CM/SW Contact  Kadesha Virrueta A [place], LCSW Phone Number: 03/23/2024, 2:24 PM  Clinical Narrative:     CSW met with pt and RN CM Stubblefied at bedside to discuss update to disposition. He stated that he wanted to go home and they're not doing anything for me here anyway.   CSW reached out to pt's brother Norleen and discussed the plan for going home versus rehab, explained pt has been getting up and walking with physical therapy and will have DME equipment to assist at home. Both pt and brother agreeable for plan for home with home health.   Coordinating transportation for home with pt's brother.   MD notified, estimated DC today.   CSW will continue to follow.   Expected Discharge Plan: Skilled Nursing Facility Barriers to Discharge: Insurance Authorization, SNF Pending bed offer, Continued Medical Work up               Expected Discharge Plan and Services       Living arrangements for the past 2 months: Apartment                                       Social Drivers of Health (SDOH) Interventions SDOH Screenings   Food Insecurity: No Food Insecurity (03/16/2024)  Housing: Unknown (03/16/2024)  Transportation Needs: No Transportation Needs (03/16/2024)  Utilities: Not At Risk (03/16/2024)  Social Connections: Socially Isolated (03/16/2024)  Tobacco Use: Low Risk  (03/14/2024)    Readmission Risk Interventions     No data to display

## 2024-03-23 NOTE — Assessment & Plan Note (Addendum)
 03-23-2024 baseline scr 1.2-1.3

## 2024-03-23 NOTE — Discharge Summary (Signed)
 [facility] Phys[facility] Discharge Summary   Patient name: Terry Baxter  Admit date:     03/14/2024  Discharge date: 03/23/2024  Attending Physician: RASHID, FARHAN [[id number]]  Discharge Physician: Camellia Door   PCP: Andria Pipe, MD  Admitted From: Home  Disposition:  Home  Recommendations for Outpatient Follow-up:  Follow up with PCP in 1-2 weeks  Home Health:Yes. HH PT, OT  Equipment/Devices: rolling walker  Discharge Condition:Stable CODE STATUS:DNR/DNI Diet recommendation: Heart Healthy Fluid Restriction: None  Hospital Summary: CC: altered mental status HPI: Patient is a 78 years old male with documented history of bipolar disorder, chronic back pain, hepatitis C and history of heroin abuse, history of TIA, hyperlipidemia, hypertension and seizure disorder with type 2 diabetes presented to hospital with complaints of altered mental status, not acting usual with generalized weakness.  Family member also found him on the floor and patient stated that he might have hurt his ankle. Patient denies any headache, nausea, vomiting, diarrhea. .  Denied head trauma. No mention of urinary urgency, frequency or dysuria.  Patient slightly confused about where he is and what was going on. Patient lives by himself in an apartment.  Spoke with the patient's brother on the phone, his brother lives across his brother.  In the ED, patient was afebrile, initial blood pressure was slightly elevated.  Labs were notable for WBC elevated at 15.0.  Chemistry showed creatinine elevation at 1.4 total CK at 1140.  Urinalysis was sent for RBC but no white cells.  Urine culture was sent.  Urine drug screen was negative.  CT head scan done in the ED showed no acute findings.  Chest x-ray without any infiltrate.  Patient was then considered for observation in the hospital.   Significant Events: Admitted 03/14/2024 for altered mental status   Admission Labs: UA large HgB, RBC >50, WBC 0-5, rare  bacteria Na 137, K 4.3, CO2 of 21, BUN 23, Scr 1.4, glu 109 T protein 7.3, alb 3.9, AST 37, AST 16, alk phos 84, T. Bili 0.9 WBC 15, HgB 13.8, plt 170 CK 1140 NH3 of 17 UDS negative A1c of 5.3% TSH 2.96 Vit D 28.88 Vit B12 of 331 Vit B1 of 135  Admission Imaging Studies: CT head No acute intracranial abnormality, specifically, no acute hemorrhage, territorial infarction, or intracranial mass. 2. Sequelae of chronic ischemic microvascular disease. CXR No active disease.    Significant Labs:   Significant Imaging Studies:   Antibiotic Therapy: Anti-infectives (From admission, onward)    None       Procedures:   Consultants: Psych Palliative care   Hospital Course by Problem: * Acute metabolic encephalopathy 03-23-2024 pt admitted to the hospital on 03-14-2024 due to altered mental status. He was treated for dehydration and rhabdomyolysis. His mentation improved for the next 9 days. CT head negative for acute CVA. Pt was prepped for discharge to SNF but pt and his brother want him to return home. Home health arranged as well as a rolling walker.  Vitamin D  deficiency 03-23-2024 vit D level low at 28.8. pt should see PCP in office to start Vit D and calcium .  Generalized weakness 03-23-2024 t admitted to the hospital on 03-14-2024 due to weakness and altered mental status. Pt has been found on the floor of his apartment. He was treated for dehydration and rhabdomyolysis. His mentation improved for the next 9 days. CT head negative for acute CVA. Pt was prepped for discharge to SNF but pt and his brother want him to  return home. Home health arranged as well as a rolling walker.   Dehydration-resolved as of 03/23/2024 03-23-2024 pt was dehydrated on admission with BUN 23 and Scr 1.4. he was treated with IVF. Now resolved.  Traumatic rhabdomyolysis (HCC)-resolved as of 03/23/2024 03-23-2024 pt was found on the floor of his apartment. He was treated with IVF. CK on admission  was 1140. Now resolved.  DM (diabetes mellitus), type 2 with renal complications (HCC) 03-23-2024 pt placed on SSI during hospitalization. Given his recent fall at home, unclear if it could have been related to any hypoglycemia. For now, I think it is safest not to start any hypoglycemic agents. Metformin contraindicated in CKD stage 3a.  CKD stage 3a, GFR 45-59 ml/min (HCC) 03-23-2024 baseline scr 1.2-1.3  Essential hypertension 03-23-2024 pt did not require any HTN meds during his hospitalization.  Seizure disorder (HCC) 03-23-2024 pt restarted on his keppra  500 mg bid.    Discharge Diagnoses:  Principal Problem:   Acute metabolic encephalopathy Active Problems:   Generalized weakness   Vitamin D  deficiency   Seizure disorder (HCC)   Essential hypertension   CKD stage 3a, GFR 45-59 ml/min (HCC)   DM (diabetes mellitus), type 2 with renal complications [facility])   Discharge Instructions  Discharge Instructions     Call MD for:  difficulty breathing, headache or visual disturbances   Complete by: As directed    Call MD for:  extreme fatigue   Complete by: As directed    Call MD for:  hives   Complete by: As directed    Call MD for:  persistant dizziness or light-headedness   Complete by: As directed    Call MD for:  persistant nausea and vomiting   Complete by: As directed    Call MD for:  redness, tenderness, or signs of infection (pain, swelling, redness, odor or green/yellow discharge around incision site)   Complete by: As directed    Call MD for:  severe uncontrolled pain   Complete by: As directed    Call MD for:  temperature >100.4   Complete by: As directed    Diet - low sodium heart healthy   Complete by: As directed    Discharge instructions   Complete by: As directed    1. Follow up with your primary care provider in 1-2 weeks following discharge from hospital.   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        Medication List     STOP taking these medications    buPROPion  300 MG 24 hr tablet Commonly known as: WELLBUTRIN  XL   clonazePAM  0.5 MG tablet Commonly known as: KLONOPIN    folic acid  1 MG tablet Commonly known as: FOLVITE    gabapentin  300 MG capsule Commonly known as: NEURONTIN    glycopyrrolate 2 MG tablet Commonly known as: ROBINUL   loratadine  10 MG tablet Commonly known as: Claritin    risperiDONE  1 MG tablet Commonly known as: RISPERDAL    scopolamine  1 MG/3DAYS Commonly known as: TRANSDERM-SCOP   traZODone 50 MG tablet Commonly known as: DESYREL       TAKE these medications    levETIRAcetam  500 MG tablet Commonly known as: KEPPRA  Take 1 tablet (500 mg total) by mouth 2 (two) times daily.  thiamine  100 MG tablet Commonly known as: VITAMIN B1 Take 1 tablet (100 mg total) by mouth daily.               Durable Medical Equipment  (From admission, onward)           Start     Ordered   03/23/24 1424  For home use only DME Walker rolling  Once       Question Answer Comment  Walker: With 5 Inch Wheels   Patient needs a walker to treat with the following condition Acute alteration in mental status   Patient needs a walker to treat with the following condition Physical deconditioning      03/23/24 1424            Follow-up Information     [facility] Follow up.   Why: [facility] will provide a RW and will deliver to the bedside. Contact information: [address] [place] [address] [phone]         [facility] Follow up.   Why: [facility] will provide home health and they will call you in the next 24-48 hours to set up services.        [facility] Follow up.   Why: Please call the office and schedule a hospital follow up in the  next 7-10 days. 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  Constitutional:      General: He is not in acute distress.    Appearance: He is normal weight. He is not toxic-appearing or diaphoretic.  HENT:     Head: Normocephalic and atraumatic.     Nose: Nose normal.  Cardiovascular:     Rate and Rhythm: Normal rate and regular rhythm.  Pulmonary:     Effort: Pulmonary effort is normal.  Abdominal:     General: Abdomen is flat. Bowel sounds are normal. There is no distension.     Palpations: Abdomen is soft.  Musculoskeletal:     Right lower leg: No edema.     Left lower leg: No edema.  Skin:    General: Skin is warm and dry.     Capillary Refill: Capillary refill takes less than 2 seconds.  Neurological:     Mental Status: He is alert and oriented to person, place, and time.     The results of significant diagnostics from this hospitalization (including imaging, microbiology, ancillary and laboratory) are listed below for reference.    Microbiology: Recent Results (from the past 240 hours)  Urine Culture     Status: None   Collection Time: 03/14/24  1:05 PM   Specimen: In/Out Cath Urine  Result Value Ref Range Status   Specimen Description IN/OUT CATH URINE  Final   Special Requests NONE  Final   Culture   Final    NO GROWTH Performed at [facility], [address], [place], [place] [address]  Report Status 03/15/2024 FINAL  Final     Labs: Basic Metabolic Panel: Recent Labs  Lab 03/17/24 0201  03/18/24 0200  NA 135 138  K 3.9 3.5  CL 103 105  CO2 22 21*  GLUCOSE 85 84  BUN 26* 20  CREATININE 1.35* 1.27*  CALCIUM  8.5* 8.6*  MG 2.1 1.9   Liver Function Tests: Recent Labs  Lab 03/18/24 0200  AST 31  ALT 13  ALKPHOS 73  BILITOT 1.2  PROT 6.2*  ALBUMIN 3.0*   CBC: Recent Labs  Lab 03/18/24 0200  WBC 7.4  NEUTROABS 4.6  HGB 12.9*  HCT 37.8*  MCV 90.2  PLT 173   Cardiac Enzymes: Recent Labs  Lab 03/17/24 0201  CKTOTAL 417*   CBG: Recent Labs  Lab 03/22/24 1156 03/22/24 1557 03/22/24 2023 03/23/24 0832 03/23/24 1229  GLUCAP 146* 110* 134* 120* 138*   Urinalysis    Component Value Date/Time   COLORURINE STRAW (A) 03/14/2024 1157   APPEARANCEUR CLEAR 03/14/2024 1157   LABSPEC 1.006 03/14/2024 1157   PHURINE 5.0 03/14/2024 1157   GLUCOSEU NEGATIVE 03/14/2024 1157   HGBUR LARGE (A) 03/14/2024 1157   BILIRUBINUR NEGATIVE 03/14/2024 1157   KETONESUR NEGATIVE 03/14/2024 1157   PROTEINUR NEGATIVE 03/14/2024 1157   UROBILINOGEN 1.0 11/07/2014 0511   NITRITE NEGATIVE 03/14/2024 1157   LEUKOCYTESUR NEGATIVE 03/14/2024 1157   Sepsis Labs Recent Labs  Lab 03/18/24 0200  WBC 7.4    Procedures/Studies: EEG adult Result Date: 03/16/2024 Shelton Arlin KIDD, MD     03/16/2024  3:56 PM Patient Name: Fynn Vanblarcom MRN: [id number] Epilepsy Attending: Arlin KIDD Shelton Referring Physician/Provider: Cheryle Page, MD Date: 03/16/2024 Duration: 22.24 mins Patient history: 78yo m with ams. EEG to evaluate for seizure Level of alertness: Awake AEDs during EEG study: LEV Technical aspects: This EEG study was done with scalp electrodes positioned according to the 10-20 International system of electrode placement. Electrical activity was reviewed with band pass filter of 1-70Hz , sensitivity of 7 uV/mm, display speed of 57mm/sec with a 60Hz  notched filter applied as appropriate. EEG data were recorded continuously and digitally stored.  Video monitoring was available and  reviewed as appropriate. Description: The posterior dominant rhythm consists of 9-10 Hz activity of moderate voltage (25-35 uV) seen predominantly in posterior head regions, symmetric and reactive to eye opening and eye closing. Drowsiness was characterized by attenuation of the posterior background rhythm. Sleep was characterized by vertex waves, maximal frontocentral region. Hyperventilation and photic stimulation were not performed.   IMPRESSION: This study is within normal limits. No seizures or epileptiform discharges were seen throughout the recording. A normal interictal EEG does not exclude the diagnosis of epilepsy. Arlin KIDD Shelton   DG Chest Portable 1 View Result Date: 03/14/2024 CLINICAL DATA:  Altered mental status. EXAM: PORTABLE CHEST 1 VIEW COMPARISON:  02/23/2024. FINDINGS: Bilateral lung fields are clear. Bilateral costophrenic angles are clear. Normal cardio-mediastinal silhouette. No acute osseous abnormalities. The soft tissues are within normal limits. IMPRESSION: No active disease. Electronically Signed   By: Ree Molt M.D.   On: 03/14/2024 13:37   CT Head Wo Contrast Result Date: 03/14/2024 CLINICAL DATA:  AMS EXAM: CT HEAD WITHOUT CONTRAST TECHNIQUE: Contiguous axial images were obtained from the base of the skull through the vertex without intravenous contrast. RADIATION DOSE REDUCTION: This exam was performed according to the departmental dose-optimization program which includes automated exposure control, adjustment of the mA and/or kV according to patient size and/or use of iterative reconstruction technique.  COMPARISON:  January 29, 2017 FINDINGS: Brain: The ventricles appear age appropriate. No mass effect or midline shift. Gray-white differentiation is preserved.Periventricular and subcortical white matter hypoattenuation, most consistent with changes of moderate chronic ischemic microvascular disease.No evidence of acute territorial infarction, extra-axial fluid collection,  hemorrhage, or mass lesion. 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 The basilar cisterns are patent without downward herniation. The cerebellar hemispheres and vermis are well formed without mass lesion or focal attenuation abnormality. Vascular: No hyperdense vessel. Skull: Normal. Negative for fracture or focal lesion. Sinuses/Orbits: The paranasal sinuses and mastoids are clear.The globes appear intact. No retrobulbar hematoma. Other: None. IMPRESSION: 1. No acute intracranial abnormality, specifically, no acute hemorrhage, territorial infarction, or intracranial mass. 2. Sequelae of chronic ischemic microvascular disease. Electronically Signed   By: Rogelia Myers M.D.   On: 03/14/2024 13:22   DG Abdomen Acute W/Chest Result Date: 02/23/2024 CLINICAL DATA:  Weight loss EXAM: DG ABDOMEN ACUTE WITH 1 VIEW CHEST COMPARISON:  12/29/2023 FINDINGS: Heart is normal size. Mediastinal contours within normal limits. Lungs clear. No effusions. Nonobstructive bowel gas pattern. Moderate stool burden in the left colon. No organomegaly, free air or suspicious calcification. IMPRESSION: No acute cardiopulmonary disease. No bowel obstruction or free air. Moderate stool burden in the left colon. Electronically Signed   By: Franky Crease M.D.   On: 02/23/2024 20:30    Time coordinating discharge: 60 mins  SIGNED:  Camellia Door, DO [facility] 03/23/24, 3:06 PM

## 2024-03-23 NOTE — Assessment & Plan Note (Addendum)
 03-23-2024 pt was dehydrated on admission with BUN 23 and Scr 1.4. he was treated with IVF. Now resolved.

## 2024-03-23 NOTE — Assessment & Plan Note (Addendum)
 03-23-2024 pt admitted to the hospital on 03-14-2024 due to altered mental status. He was treated for dehydration and rhabdomyolysis. His mentation improved for the next 9 days. CT head negative for acute CVA. Pt was prepped for discharge to SNF but pt and his brother want him to return home. Home health arranged as well as a rolling walker.

## 2024-03-23 NOTE — Hospital Course (Addendum)
 CC: altered mental status HPI: Patient is a 78 years old male with documented history of bipolar disorder, chronic back pain, hepatitis C and history of heroin abuse, history of TIA, hyperlipidemia, hypertension and seizure disorder with type 2 diabetes presented to hospital with complaints of altered mental status, not acting usual with generalized weakness.  Family member also found him on the floor and patient stated that he might have hurt his ankle. Patient denies any headache, nausea, vomiting, diarrhea. .  Denied head trauma. No mention of urinary urgency, frequency or dysuria.  Patient slightly confused about where he is and what was going on. Patient lives by himself in an apartment.  Spoke with the patient's brother on the phone, his brother lives across his brother.  In the ED, patient was afebrile, initial blood pressure was slightly elevated.  Labs were notable for WBC elevated at 15.0.  Chemistry showed creatinine elevation at 1.4 total CK at 1140.  Urinalysis was sent for RBC but no white cells.  Urine culture was sent.  Urine drug screen was negative.  CT head scan done in the ED showed no acute findings.  Chest x-ray without any infiltrate.  Patient was then considered for observation in the hospital.   Significant Events: Admitted 03/14/2024 for altered mental status 03-17-2024 pt seen by both palliative care and psych consults. Pt made DNR/DNI by palliative care. Psych did not feel pt needed any inpatient mental health treatments.  Admission Labs: UA large HgB, RBC >50, WBC 0-5, rare bacteria Na 137, K 4.3, CO2 of 21, BUN 23, Scr 1.4, glu 109 T protein 7.3, alb 3.9, AST 37, AST 16, alk phos 84, T. Bili 0.9 WBC 15, HgB 13.8, plt 170 CK 1140 NH3 of 17 UDS negative A1c of 5.3% TSH 2.96 Vit D 28.88 Vit B12 of 331 Vit B1 of 135  Admission Imaging Studies: CT head No acute intracranial abnormality, specifically, no acute hemorrhage, territorial infarction, or intracranial mass. 2.  Sequelae of chronic ischemic microvascular disease. CXR No active disease.    Significant Labs:   Significant Imaging Studies:   Antibiotic Therapy: Anti-infectives (From admission, onward)    None       Procedures:   Consultants: Psych Palliative care

## 2024-03-23 NOTE — TOC Progression Note (Addendum)
 Transition of Care [facility]) - Progression Note    Patient Details  Name: Terry Baxter MRN: [id number] Date of Birth: [birth year]  Transition of [facility]) CM/SW Contact  Rosaline JONELLE Joe, RN Phone Number: 03/23/2024, 2:32 PM  Clinical Narrative:    CM met with the patient and LCSW at the bedside to discuss patient returning home today.  Patient states that he is agreeable to go home.  Medicare choice regarding home health was offered and patient did not have a preference.  I called [facility] and Jon Rogue, RNCM accepted for services for [facility] PT, OT - orders placed to be co-signed by MD.  [facility] was called to deliver RW to the bedside.  DME order placed to be co-signed by MD.  Patient has cardiologist but no listed PCP.  [facility] placed in AVs for patient's brother to call and schedule so that family can provide transportation.  Patient's brother plans to provide transportation to home in the next couple of hours.   Expected Discharge Plan: Skilled Nursing Facility Barriers to Discharge: Insurance Authorization, SNF Pending bed offer, Continued Medical Work up               Expected Discharge Plan and Services       Living arrangements for the past 2 months: Apartment Expected Discharge Date: 03/23/24                                     Social Drivers of Health (SDOH) Interventions SDOH Screenings   Food Insecurity: No Food Insecurity (03/16/2024)  Housing: Unknown (03/16/2024)  Transportation Needs: No Transportation Needs (03/16/2024)  Utilities: Not At Risk (03/16/2024)  Social Connections: Socially Isolated (03/16/2024)  Tobacco Use: Low Risk  (03/14/2024)    Readmission Risk Interventions    03/23/2024    2:32 PM  Readmission Risk Prevention Plan  Transportation Screening Complete  PCP or Specialist Appt within 3-5 Days Complete  HRI or Home Care Consult Complete  Social Work Consult for Recovery Care Planning/Counseling Complete   Palliative Care Screening Complete  Medication Review [facility]) Referral to Pharmacy

## 2024-03-23 NOTE — Assessment & Plan Note (Deleted)
 03-23-2024 baseline scr 1.2-1.3

## 2024-03-23 NOTE — Assessment & Plan Note (Addendum)
 03-23-2024 t admitted to the hospital on 03-14-2024 due to weakness and altered mental status. Pt has been found on the floor of his apartment. He was treated for dehydration and rhabdomyolysis. His mentation improved for the next 9 days. CT head negative for acute CVA. Pt was prepped for discharge to SNF but pt and his brother want him to return home. Home health arranged as well as a rolling walker.

## 2024-03-23 NOTE — Assessment & Plan Note (Addendum)
 03-23-2024 pt restarted on his keppra  500 mg bid.

## 2024-03-23 NOTE — Assessment & Plan Note (Addendum)
 03-23-2024 pt did not require any HTN meds during his hospitalization.

## 2024-03-23 NOTE — Plan of Care (Signed)

## 2024-03-23 NOTE — Assessment & Plan Note (Addendum)
 03-23-2024 vit D level low at 28.8. pt should see PCP in office to start Vit D and calcium .

## 2024-04-20 DIAGNOSIS — F319 Bipolar disorder, unspecified: Secondary | ICD-10-CM | POA: Diagnosis not present

## 2024-04-20 DIAGNOSIS — F419 Anxiety disorder, unspecified: Secondary | ICD-10-CM | POA: Diagnosis not present

## 2024-04-20 DIAGNOSIS — G2401 Drug induced subacute dyskinesia: Secondary | ICD-10-CM | POA: Diagnosis not present
